# Patient Record
Sex: Female | Born: 1970 | Race: White | Hispanic: No | Smoking: Former smoker
Health system: Southern US, Community
[De-identification: ages and names within clinical notes are randomized; demographics above are authoritative.]

## PROBLEM LIST (undated history)

## (undated) DIAGNOSIS — M542 Cervicalgia: Secondary | ICD-10-CM

## (undated) DIAGNOSIS — F329 Major depressive disorder, single episode, unspecified: Secondary | ICD-10-CM

## (undated) DIAGNOSIS — R42 Dizziness and giddiness: Secondary | ICD-10-CM

## (undated) DIAGNOSIS — M5481 Occipital neuralgia: Secondary | ICD-10-CM

## (undated) DIAGNOSIS — R519 Headache, unspecified: Secondary | ICD-10-CM

## (undated) DIAGNOSIS — F419 Anxiety disorder, unspecified: Secondary | ICD-10-CM

## (undated) DIAGNOSIS — L0291 Cutaneous abscess, unspecified: Secondary | ICD-10-CM

## (undated) DIAGNOSIS — R5382 Chronic fatigue, unspecified: Secondary | ICD-10-CM

## (undated) DIAGNOSIS — M1990 Unspecified osteoarthritis, unspecified site: Secondary | ICD-10-CM

## (undated) DIAGNOSIS — M549 Dorsalgia, unspecified: Secondary | ICD-10-CM

## (undated) DIAGNOSIS — M797 Fibromyalgia: Secondary | ICD-10-CM

## (undated) DIAGNOSIS — F32A Depression, unspecified: Secondary | ICD-10-CM

## (undated) DIAGNOSIS — K219 Gastro-esophageal reflux disease without esophagitis: Secondary | ICD-10-CM

## (undated) DIAGNOSIS — E119 Type 2 diabetes mellitus without complications: Secondary | ICD-10-CM

## (undated) DIAGNOSIS — M5430 Sciatica, unspecified side: Secondary | ICD-10-CM

## (undated) DIAGNOSIS — I10 Essential (primary) hypertension: Secondary | ICD-10-CM

## (undated) DIAGNOSIS — G8929 Other chronic pain: Secondary | ICD-10-CM

## (undated) DIAGNOSIS — R51 Headache: Secondary | ICD-10-CM

## (undated) DIAGNOSIS — D649 Anemia, unspecified: Secondary | ICD-10-CM

## (undated) HISTORY — PX: DILATION AND CURETTAGE OF UTERUS: SHX78

## (undated) HISTORY — PX: TUBAL LIGATION: SHX77

---

## 2000-04-05 ENCOUNTER — Ambulatory Visit (HOSPITAL_COMMUNITY): Admission: RE | Admit: 2000-04-05 | Discharge: 2000-04-05 | Payer: Self-pay | Admitting: Family Medicine

## 2000-04-20 ENCOUNTER — Other Ambulatory Visit: Admission: RE | Admit: 2000-04-20 | Discharge: 2000-04-20 | Payer: Self-pay | Admitting: Family Medicine

## 2000-06-13 ENCOUNTER — Encounter: Payer: Self-pay | Admitting: Family Medicine

## 2000-06-13 ENCOUNTER — Ambulatory Visit (HOSPITAL_COMMUNITY): Admission: RE | Admit: 2000-06-13 | Discharge: 2000-06-13 | Payer: Self-pay | Admitting: Family Medicine

## 2000-11-03 ENCOUNTER — Inpatient Hospital Stay (HOSPITAL_COMMUNITY): Admission: AD | Admit: 2000-11-03 | Discharge: 2000-11-07 | Payer: Self-pay | Admitting: Family Medicine

## 2004-06-16 ENCOUNTER — Ambulatory Visit: Payer: Self-pay | Admitting: Family Medicine

## 2004-08-24 ENCOUNTER — Ambulatory Visit: Payer: Self-pay | Admitting: Family Medicine

## 2005-01-21 ENCOUNTER — Ambulatory Visit: Payer: Self-pay | Admitting: Family Medicine

## 2005-06-28 ENCOUNTER — Ambulatory Visit: Payer: Self-pay | Admitting: Family Medicine

## 2005-07-06 ENCOUNTER — Ambulatory Visit: Payer: Self-pay | Admitting: Family Medicine

## 2006-01-18 ENCOUNTER — Ambulatory Visit: Payer: Self-pay | Admitting: Family Medicine

## 2006-04-12 ENCOUNTER — Ambulatory Visit: Payer: Self-pay | Admitting: Family Medicine

## 2006-12-06 ENCOUNTER — Ambulatory Visit: Payer: Self-pay | Admitting: Family Medicine

## 2007-12-20 ENCOUNTER — Emergency Department (HOSPITAL_COMMUNITY): Admission: EM | Admit: 2007-12-20 | Discharge: 2007-12-20 | Payer: Self-pay | Admitting: Emergency Medicine

## 2009-07-25 ENCOUNTER — Emergency Department (HOSPITAL_COMMUNITY): Admission: EM | Admit: 2009-07-25 | Discharge: 2009-07-25 | Payer: Self-pay | Admitting: Emergency Medicine

## 2009-09-04 ENCOUNTER — Emergency Department (HOSPITAL_COMMUNITY): Admission: EM | Admit: 2009-09-04 | Discharge: 2009-09-04 | Payer: Self-pay | Admitting: Emergency Medicine

## 2009-11-12 ENCOUNTER — Emergency Department (HOSPITAL_COMMUNITY): Admission: EM | Admit: 2009-11-12 | Discharge: 2009-11-12 | Payer: Self-pay | Admitting: Emergency Medicine

## 2010-01-25 ENCOUNTER — Emergency Department (HOSPITAL_COMMUNITY): Admission: EM | Admit: 2010-01-25 | Discharge: 2010-01-25 | Payer: Self-pay | Admitting: Emergency Medicine

## 2010-09-26 LAB — URINALYSIS, ROUTINE W REFLEX MICROSCOPIC
Bilirubin Urine: NEGATIVE
Glucose, UA: NEGATIVE mg/dL
Hgb urine dipstick: NEGATIVE
Ketones, ur: NEGATIVE mg/dL
Nitrite: NEGATIVE
Protein, ur: NEGATIVE mg/dL
Specific Gravity, Urine: 1.015 (ref 1.005–1.030)
Urobilinogen, UA: 0.2 mg/dL (ref 0.0–1.0)
pH: 8 (ref 5.0–8.0)

## 2010-09-26 LAB — POCT PREGNANCY, URINE: Preg Test, Ur: NEGATIVE

## 2010-09-27 LAB — URINALYSIS, ROUTINE W REFLEX MICROSCOPIC
Ketones, ur: NEGATIVE mg/dL
Nitrite: NEGATIVE
Protein, ur: NEGATIVE mg/dL
Urobilinogen, UA: 0.2 mg/dL (ref 0.0–1.0)
pH: 6 (ref 5.0–8.0)

## 2010-09-27 LAB — URINE CULTURE: Colony Count: NO GROWTH

## 2010-11-27 NOTE — Discharge Summary (Signed)
[facility]  Patient:    Destiny Beck, Destiny Beck                      MRN: [id number] Adm. Date:  [id number] Disc. Date: 11/07/00 Attending:  Bosie Clos                           Discharge Summary  DATE OF BIRTH:                1970-09-26  ADMITTING DIAGNOSES:          A 40 year old G2, P1-0-0-1 at 30 5/7 weeks with spontaneous rupture of membranes, positive group B strep.  DISCHARGE DIAGNOSES:          1. A G2, P2 delivered at 39 5/7 weeks.                               2. Arrest of dilatation and persistent occiput                                  posterior presentation.                               3. Iron deficiency anemia.  PROCEDURE:                    Primary low transverse cesarean section for arrest of dilatation and descent and persistent right occiput posterior presentation.  SURGEON:                      Roseanna Rainbow, M.D.  FIRST ASSISTANT:              Lenda Kelp, M.D.  DISCHARGE MEDICATIONS:        1. Motrin 800 mg q.8h.                               2. Tylox one to two q.6h. p.r.n. pain.                               3. Ferrous sulfate b.i.d.                               4. Depo-Provera 150 mg IM given prior to                                  discharge.  DISCHARGE INSTRUCTIONS:       Routine postoperative discharge instructions were given.  Patient was to follow-up on Wednesday, Nov 09, 2000 at 2 p.m. for staple removal and again on Monday, Nov 14, 2000 for general followup.  DISCHARGE SUMMARY:            A 40 year old G2, P1 at 18 5/7 weeks presented after spontaneous rupture of membranes with clear fluid at 5-6 cm dilated. She progressed quickly to 7 cm, but then developed an arrest of dilatation at 8 cm with no further descent in station from -1/-2 station despite documented adequate  labor and augmentation with Pitocin.  She was taken to the OR for a primary low transverse cesarean section by Dr. Antionette Char with Dr. Gweneth Dimitri as first assist.  See operative note for further details. Delivered of a viable female infant persistent right occiput posterior presentation 7 pounds 3 ounces on November 03, 2000.  Postpartum course was routine, although the patient did have mildly elevated pain as compared to the normal course.  Very poor social situation with increased risk of postpartum depression led to extending her stay an additional 12 hours.  At the time of discharge the patient was ambulating well, stooling normally, voiding normally, eating and drinking well.  She was also caring for her infant son. She was bottle feeding.  Patient was to follow-up in 48 hours for staple removal in the office. DD:  11/07/00 TD:  11/07/00 Job: [id number]

## 2010-11-27 NOTE — Op Note (Signed)
[facility]  Patient:    Destiny Beck, Destiny Beck                      MRN: [id number] Proc. Date: 11/03/00 Adm. Date:  [id number] Attending:  Bosie Clos                           Operative Report  PREOPERATIVE DIAGNOSES:       1. Intrauterine pregnancy at term.                               2. Persistent occiput posterior.                               3. Arrest of dilatation.                               4. Active phase.  POSTOPERATIVE DIAGNOSES:      1. Intrauterine pregnancy at term.                               2. Persistent occiput posterior.                               3. Arrest of dilatation.                               4. Active phase.  PROCEDURE:                    Primary low uterine flap elliptical cesarean delivery via Pfannenstiel.  SURGEON:                      Roseanna Rainbow, M.D.  Threasa HeadsUvaldo Rising, M.D.  ANESTHESIA:                   Epidural.  COMPLICATIONS:                None.  ESTIMATED BLOOD LOSS:         800 cc.  FLUIDS:                       1500 cc lactated Ringers.  URINE OUTPUT:                 300 cc clear urine.  INDICATIONS:                  The patient presented in early active labor at term.  She was then augmented with Pitocin.  Maximum dilatation 8 cm.  FINDINGS:                     The infant was in the cephalic presentation. Neonatology was present at delivery.  Normal uterus, tubes and ovaries.  DESCRIPTION OF PROCEDURE:     The patient was taken to the operating room, where epidural anesthetic was found to be adequate.  She was then prepped and draped in the  usual sterile fashion in the dorsal supine position with a leftward tilt.  A Pfannenstiel skin incision was then made with a scalpel an carried through to the underlying layer of fascia.  The fascia was nicked in a the midline and the incision extended laterally with Mayo scissors.  The superior aspect of the  fascial incision was then grasped with Kocher clams, elevated, and the underlying rectus muscles dissected off.  Attention was turned to the inferior aspect of this incision, which was manipulated in a similar fashion.  The rectus muscles were separated in the midline and the peritoneum identified, tented up and entered sharply.  The peritoneal incision was then extended superiorly and inferiorly with good visualization of the bladder.  A bladder blade was then inserted and the vesicouterine peritoneum identified, grasped with  pickups and entered sharply.  This incision was then extended laterally and a bladder flap created digitally.  The bladder blade was then reinserted and the lower uterine segment incised in a transverse fashion with a scalpel.  The uterine incision was then extended laterally with bandage scissors.  The bladder blade was removed and the infants head delivered atraumatically.  The nose and mouth were suctioned with bulb suction.  The cord was clamped and cut.  The infant was handed off to the waiting neonatologists.  The placenta was then removed.  The uterus was exteriorized and cleared of all clots and debris.  The uterine incision was repaired with O Monocryl in running lock fashion.  Excellent hemostasis was noted.  The uterus was returned to the abdomen.  The gutters were cleared of all clots.  The fascia was reapproximated with 0 Vicryl in running fashion. The skin was closed with staples.  The patient tolerated this procedure well. Sponge, lap, and needle counts were correct x 2.  The patient was taken to the PACU in stable condition. DD:  11/07/00 TD:  11/07/00 Job: [id number]

## 2012-12-25 ENCOUNTER — Emergency Department (HOSPITAL_COMMUNITY): Payer: Managed Care, Other (non HMO)

## 2012-12-25 ENCOUNTER — Emergency Department (HOSPITAL_COMMUNITY)
Admission: EM | Admit: 2012-12-25 | Discharge: 2012-12-25 | Disposition: A | Discharge status: Home / Self Care | Payer: Managed Care, Other (non HMO) | Attending: Emergency Medicine | Admitting: Emergency Medicine

## 2012-12-25 ENCOUNTER — Encounter (HOSPITAL_COMMUNITY): Payer: Self-pay | Admitting: *Deleted

## 2012-12-25 DIAGNOSIS — R6889 Other general symptoms and signs: Secondary | ICD-10-CM | POA: Insufficient documentation

## 2012-12-25 DIAGNOSIS — R059 Cough, unspecified: Secondary | ICD-10-CM | POA: Insufficient documentation

## 2012-12-25 DIAGNOSIS — Z79899 Other long term (current) drug therapy: Secondary | ICD-10-CM | POA: Insufficient documentation

## 2012-12-25 DIAGNOSIS — R109 Unspecified abdominal pain: Secondary | ICD-10-CM | POA: Insufficient documentation

## 2012-12-25 DIAGNOSIS — J3489 Other specified disorders of nose and nasal sinuses: Secondary | ICD-10-CM | POA: Insufficient documentation

## 2012-12-25 DIAGNOSIS — F17200 Nicotine dependence, unspecified, uncomplicated: Secondary | ICD-10-CM | POA: Insufficient documentation

## 2012-12-25 DIAGNOSIS — R05 Cough: Secondary | ICD-10-CM | POA: Insufficient documentation

## 2012-12-25 LAB — CBC WITH DIFFERENTIAL/PLATELET
Basophils Absolute: 0 10*3/uL (ref 0.0–0.1)
Eosinophils Relative: 3 % (ref 0–5)
HCT: 37.4 % (ref 36.0–46.0)
Lymphocytes Relative: 20 % (ref 12–46)
Lymphs Abs: 1 10*3/uL (ref 0.7–4.0)
MCV: 88 fL (ref 78.0–100.0)
Monocytes Absolute: 0.5 10*3/uL (ref 0.1–1.0)
Neutro Abs: 3.2 10*3/uL (ref 1.7–7.7)
Platelets: 208 10*3/uL (ref 150–400)
RBC: 4.25 MIL/uL (ref 3.87–5.11)
WBC: 4.8 10*3/uL (ref 4.0–10.5)

## 2012-12-25 LAB — BASIC METABOLIC PANEL
CO2: 28 mEq/L (ref 19–32)
Chloride: 102 mEq/L (ref 96–112)
GFR calc Af Amer: >90 mL/min (ref >90)
Potassium: 4.5 mEq/L (ref 3.5–5.1)
Sodium: 138 mEq/L (ref 135–145)

## 2012-12-25 LAB — URINALYSIS, ROUTINE W REFLEX MICROSCOPIC
Glucose, UA: NEGATIVE mg/dL
Leukocytes, UA: NEGATIVE
Nitrite: NEGATIVE
Specific Gravity, Urine: 1.02 (ref 1.005–1.030)
pH: 7.5 (ref 5.0–8.0)

## 2012-12-25 MED ORDER — SODIUM CHLORIDE 0.9 % IV SOLN
INTRAVENOUS | Status: DC
  Administered 2012-12-25: 16:00:00 via INTRAVENOUS

## 2012-12-25 MED ORDER — SODIUM CHLORIDE 0.9 % IV BOLUS (SEPSIS)
250.0000 mL | Freq: Once | INTRAVENOUS | Status: AC
  Administered 2012-12-25: 250 mL via INTRAVENOUS

## 2012-12-25 MED ORDER — NAPROXEN 500 MG PO TABS: 500.0000 mg | ORAL_TABLET | Freq: Two times a day (BID) | ORAL | Status: DC

## 2012-12-25 MED ORDER — IOHEXOL 300 MG/ML  SOLN
50.0000 mL | Freq: Once | INTRAMUSCULAR | Status: AC | PRN
  Administered 2012-12-25: 50 mL via ORAL

## 2012-12-25 MED ORDER — ONDANSETRON HCL 4 MG/2ML IJ SOLN
4.0000 mg | Freq: Once | INTRAMUSCULAR | Status: AC
  Administered 2012-12-25: 4 mg via INTRAVENOUS
  Filled 2012-12-25: qty 2

## 2012-12-25 MED ORDER — HYDROCODONE-ACETAMINOPHEN 5-325 MG PO TABS: 1.0000 | ORAL_TABLET | Freq: Four times a day (QID) | ORAL | Status: DC | PRN

## 2012-12-25 MED ORDER — HYDROMORPHONE HCL PF 1 MG/ML IJ SOLN
0.5000 mg | Freq: Once | INTRAMUSCULAR | Status: AC
  Administered 2012-12-25: 0.5 mg via INTRAVENOUS
  Filled 2012-12-25: qty 1

## 2012-12-25 MED ORDER — IOHEXOL 300 MG/ML  SOLN
100.0000 mL | Freq: Once | INTRAMUSCULAR | Status: AC | PRN
  Administered 2012-12-25: 100 mL via INTRAVENOUS

## 2012-12-25 NOTE — ED Provider Notes (Addendum)
History     This chart was scribed for Destiny Jakes, MD, MD by Smitty Pluck, ED Scribe. The patient was seen in room APA05/APA05 and the patient's care was started at 3:00 PM.   CSN: [id number]  Arrival date & time 12/25/12  1127   Chief Complaint  Patient presents with  . Abdominal Pain  . Cough     Patient is a 42 y.o. female presenting with abdominal pain. The history is provided by the patient and medical records. No language interpreter was used.  Abdominal Pain This is a new problem. The current episode started more than 2 days ago. The problem occurs daily. The problem has not changed since onset.Associated symptoms include abdominal pain. Pertinent negatives include no headaches. The symptoms are aggravated by bending and coughing. Nothing relieves the symptoms. She has tried nothing for the symptoms.   HPI Comments: Destiny Beck is a 42 y.o. female who presents to the Emergency Department complaining of intermittent, moderate, lower abdominal pain that is greater in LLQ onset 1 week. Pain is rated at 8/10. She reports that pain started 1 day after using slip-n-slide with son. She states that the pain feels similar to when she had c-section. Pain is aggravated by movement. She reports that she has taken 800 mg ibuprofen without relief. Pt reports that she has productive cough with yellow sputum and congestion. Pt denies blood in urine, dysuria, fever, chills, nausea, vomiting, diarrhea, weakness, SOB and any other pain.   PCP is Dr. Lysbeth Galas in [place]  History reviewed. No pertinent past medical history.  Past Surgical History  Procedure Laterality Date  . Cesarean section      No family history on file.  History  Substance Use Topics  . Smoking status: Current Some Day Smoker  . Smokeless tobacco: Not on file  . Alcohol Use: Yes     Comment: Occ    OB History   Grav Para Term Preterm Abortions TAB SAB Ect Mult Living                  Review of Systems   Constitutional: Negative for fever and chills.  HENT: Positive for congestion and sneezing.   Respiratory: Positive for cough.   Cardiovascular: Negative for leg swelling.  Gastrointestinal: Positive for abdominal pain. Negative for nausea, vomiting and diarrhea.  Genitourinary: Negative for dysuria.  Musculoskeletal: Negative for back pain.  Skin: Negative for rash.  Neurological: Negative for light-headedness and headaches.  Hematological: Does not bruise/bleed easily.  Psychiatric/Behavioral: Negative for confusion.  All other systems reviewed and are negative.    Allergies  Review of patient's allergies indicates no known allergies.  Home Medications   Current Outpatient Rx  Name  Route  Sig  Dispense  Refill  . ALPRAZolam (XANAX) 0.5 MG tablet   Oral   Take 0.5 mg by mouth 2 (two) times daily as needed for sleep.         . Biotin 5000 MCG TABS   Oral   Take 1 tablet by mouth daily.         Marland Kitchen escitalopram (LEXAPRO) 10 MG tablet   Oral   Take 10 mg by mouth daily.         Marland Kitchen ibuprofen (ADVIL,MOTRIN) 800 MG tablet   Oral   Take 800 mg by mouth every 8 (eight) hours as needed for pain.         Marland Kitchen HYDROcodone-acetaminophen (NORCO/VICODIN) 5-325 MG per tablet   Oral  Take 1-2 tablets by mouth every 6 (six) hours as needed for pain.   15 tablet   0   . naproxen (NAPROSYN) 500 MG tablet   Oral   Take 1 tablet (500 mg total) by mouth 2 (two) times daily.   14 tablet   0     BP 107/71  Pulse 60  Temp(Src) 98.6 F (37 C) (Oral)  Resp 20  Ht 5' (1.524 m)  Wt 140 lb (63.504 kg)  BMI 27.34 kg/m2  SpO2 100%  LMP 12/03/2012  Physical Exam  Nursing note and vitals reviewed. Constitutional: She is oriented to person, place, and time. She appears well-developed and well-nourished. No distress.  HENT:  Head: Normocephalic and atraumatic.  Eyes: Conjunctivae and EOM are normal. Pupils are equal, round, and reactive to light.  Neck: Normal range of motion.  Neck supple. No tracheal deviation present.  Cardiovascular: Normal rate, regular rhythm and normal heart sounds.   No murmur heard. Pulmonary/Chest: Effort normal and breath sounds normal. No respiratory distress. She has no wheezes. She has no rales.  Abdominal: Soft. Bowel sounds are normal. She exhibits no distension. There is tenderness (bilateral lower quadrants). There is no rebound and no guarding.  Musculoskeletal: Normal range of motion. She exhibits no edema.  Neurological: She is alert and oriented to person, place, and time. No cranial nerve deficit. Coordination normal.  Skin: Skin is warm and dry.  Psychiatric: She has a normal mood and affect. Her behavior is normal.    ED Course  Procedures (including critical care time) DIAGNOSTIC STUDIES: Oxygen Saturation is 100% on room air, normal by my interpretation.    COORDINATION OF CARE: 3:05 PM Discussed ED treatment with pt and pt agrees.     Labs Reviewed  BASIC METABOLIC PANEL - Abnormal; Notable for the following:    GFR calc non Af Amer 83 (*)    All other components within normal limits  URINALYSIS, ROUTINE W REFLEX MICROSCOPIC  CBC WITH DIFFERENTIAL   Dg Chest 2 View  12/25/2012   *RADIOLOGY REPORT*  Clinical Data: Abdominal pain, cough  CHEST - 2 VIEW  Comparison: Prior chest x-ray 04/10/2012  Findings: The lungs are well-aerated and free from pulmonary edema, focal airspace consolidation or pulmonary nodule.  Cardiac and mediastinal contours are within normal limits.  No pneumothorax, or pleural effusion. No acute osseous findings.  IMPRESSION:  No acute cardiopulmonary disease.   Original Report Authenticated By: Malachy Moan, M.D.   Ct Abdomen Pelvis W Contrast  12/25/2012   *RADIOLOGY REPORT*  Clinical Data: Abdominal pain and cough  CT ABDOMEN AND PELVIS WITH CONTRAST  Technique:  Multidetector CT imaging of the abdomen and pelvis was performed following the standard protocol during bolus administration of  intravenous contrast.  Contrast: 50mL OMNIPAQUE IOHEXOL 300 MG/ML  SOLN, OMNIPAQUE IOHEXOL 300 MG/ML  SOLN  Comparison: CT abdomen pelvis without contrast 07/30/2009  Findings: There is dependent atelectasis in both lower lobes imaged portion the heart is unremarkable.  Negative for pleural or pericardial effusion.  3 mm low density lesion in the left lobe of the liver is too small to characterize but statistically likely benign.  A similar appearing 3 mm low density lesion in the right lobe of the lobe posteriorly.  The liver is normal in size.  Negative for biliary ductal dilatation.  The there are a few sub-centimeter calcified gallstones layering dependently within the gallbladder.  This appearance is unchanged compared to 1011.  The gallbladder wall thickness  appears normal and there is no pericholecystic inflammatory change.  The kidneys are normal in size and contour bilaterally.  Bilateral nephrolithiasis is present.  There are two stones in the lower pole right kidney, largest measuring 3 mm.  One stone is seen in the lower pole left kidney, measuring 3 mm.  Negative for hydronephrosis or perinephric stranding.  Both ureters are normal in caliber.  No ureteral stone or periureteric inflammatory change.  Urinary bladder appears normal.  The spleen, adrenal glands, and pancreas are within normal limits.  The stomach, small bowel loops and terminal ileum are normal.  The appendix is visualized on images 61-63 and is normal.  Colon is normal in caliber.  There are a few scattered colonic diverticula but appear inflamed.  Abdominal aorta is normal in caliber.  Negative for lymphadenopathy, ascites, or free air.  The uterus appears stable in size and contour.  The adnexa are within normal limits.  Vertebral bodies are normal in height and alignment.  There is a small posterior disc bulge at L5-S1.  No significant disc height narrowing.  IMPRESSION:  1.  Bilateral nonobstructing renal calculi. 2.   Cholelithiasis. 3.  No definite acute findings in the abdomen or pelvis. 4.  Two tiny low density lesions in the liver are too small to characterize and statistically most likely benign.   Original Report Authenticated By: Britta Mccreedy, M.D.   Results for orders placed during the hospital encounter of 12/25/12  URINALYSIS, ROUTINE W REFLEX MICROSCOPIC      Result Value Range   Color, Urine YELLOW  YELLOW   APPearance CLEAR  CLEAR   Specific Gravity, Urine 1.020  1.005 - 1.030   pH 7.5  5.0 - 8.0   Glucose, UA NEGATIVE  NEGATIVE mg/dL   Hgb urine dipstick NEGATIVE  NEGATIVE   Bilirubin Urine NEGATIVE  NEGATIVE   Ketones, ur NEGATIVE  NEGATIVE mg/dL   Protein, ur NEGATIVE  NEGATIVE mg/dL   Urobilinogen, UA 0.2  0.0 - 1.0 mg/dL   Nitrite NEGATIVE  NEGATIVE   Leukocytes, UA NEGATIVE  NEGATIVE  BASIC METABOLIC PANEL      Result Value Range   Sodium 138  135 - 145 mEq/L   Potassium 4.5  3.5 - 5.1 mEq/L   Chloride 102  96 - 112 mEq/L   CO2 28  19 - 32 mEq/L   Glucose, Bld 85  70 - 99 mg/dL   BUN 12  6 - 23 mg/dL   Creatinine, Ser 1.61  0.50 - 1.10 mg/dL   Calcium 9.3  8.4 - 09.6 mg/dL   GFR calc non Af Amer 83 (*) >90 mL/min   GFR calc Af Amer >90  >90 mL/min  CBC WITH DIFFERENTIAL      Result Value Range   WBC 4.8  4.0 - 10.5 K/uL   RBC 4.25  3.87 - 5.11 MIL/uL   Hemoglobin 12.0  12.0 - 15.0 g/dL   HCT 04.5  40.9 - 81.1 %   MCV 88.0  78.0 - 100.0 fL   MCH 28.2  26.0 - 34.0 pg   MCHC 32.1  30.0 - 36.0 g/dL   RDW 91.4  78.2 - 95.6 %   Platelets 208  150 - 400 K/uL   Neutrophils Relative % 67  43 - 77 %   Neutro Abs 3.2  1.7 - 7.7 K/uL   Lymphocytes Relative 20  12 - 46 %   Lymphs Abs 1.0  0.7 - 4.0 K/uL  Monocytes Relative 10  3 - 12 %   Monocytes Absolute 0.5  0.1 - 1.0 K/uL   Eosinophils Relative 3  0 - 5 %   Eosinophils Absolute 0.1  0.0 - 0.7 K/uL   Basophils Relative 0  0 - 1 %   Basophils Absolute 0.0  0.0 - 0.1 K/uL     1. Lower abdominal pain, unspecified  laterality       MDM  Incidental finding of gallstones do not believe that is related to her pain. Suspect pain may be muscle skeletal particularly with the recent use of the slip and slide. Rest the CT was negative no significant leukocytosis no evidence urinary tract infection no evidence of renal insufficiency. We'll treat with anti-inflammatories and pain medicine patient will be told about the gallstone finding in case develops symptoms in the future. She has a primary care Dr. followup with.  Patient also with a cough chest x-rays negative for pneumonia. It may just be a bronchitis. Will recommend Mucinex DM over-the-counter.   I personally performed the services described in this documentation, which was scribed in my presence. The recorded information has been reviewed and is accurate.     Destiny Jakes, MD 12/25/12 4540  Destiny Jakes, MD 12/25/12 [phone]

## 2012-12-25 NOTE — ED Notes (Signed)
Lower abdominal pain x 1 week, intermittent, states pain is worse in certain positions. Worse with coughing. Pt states it feels like a pulled muscle. Productive cough, dark yellow in color x 1 1/2 weeks.

## 2013-05-10 ENCOUNTER — Emergency Department (HOSPITAL_COMMUNITY)
Admission: EM | Admit: 2013-05-10 | Discharge: 2013-05-10 | Disposition: A | Discharge status: Home / Self Care | Payer: Managed Care, Other (non HMO) | Attending: Emergency Medicine | Admitting: Emergency Medicine

## 2013-05-10 ENCOUNTER — Encounter (HOSPITAL_COMMUNITY): Payer: Self-pay | Admitting: Emergency Medicine

## 2013-05-10 DIAGNOSIS — F411 Generalized anxiety disorder: Secondary | ICD-10-CM | POA: Insufficient documentation

## 2013-05-10 DIAGNOSIS — M62830 Muscle spasm of back: Secondary | ICD-10-CM

## 2013-05-10 DIAGNOSIS — M62838 Other muscle spasm: Secondary | ICD-10-CM | POA: Insufficient documentation

## 2013-05-10 DIAGNOSIS — Z791 Long term (current) use of non-steroidal anti-inflammatories (NSAID): Secondary | ICD-10-CM | POA: Insufficient documentation

## 2013-05-10 DIAGNOSIS — Z87891 Personal history of nicotine dependence: Secondary | ICD-10-CM | POA: Insufficient documentation

## 2013-05-10 DIAGNOSIS — Z79899 Other long term (current) drug therapy: Secondary | ICD-10-CM | POA: Insufficient documentation

## 2013-05-10 HISTORY — DX: Anxiety disorder, unspecified: F41.9

## 2013-05-10 MED ORDER — NAPROXEN 500 MG PO TABS: 500.0000 mg | ORAL_TABLET | Freq: Two times a day (BID) | ORAL | Status: DC

## 2013-05-10 MED ORDER — KETOROLAC TROMETHAMINE 60 MG/2ML IM SOLN
60.0000 mg | Freq: Once | INTRAMUSCULAR | Status: AC
  Administered 2013-05-10: 60 mg via INTRAMUSCULAR
  Filled 2013-05-10: qty 2

## 2013-05-10 MED ORDER — METHOCARBAMOL 500 MG PO TABS: 500.0000 mg | ORAL_TABLET | Freq: Two times a day (BID) | ORAL | Status: DC | PRN

## 2013-05-10 NOTE — ED Notes (Signed)
Patient with no complaints at this time. Respirations even and unlabored. Skin warm/dry. Discharge instructions reviewed with patient at this time. Patient given opportunity to voice concerns/ask questions. Patient discharged at this time and left Emergency Department with steady gait.   

## 2013-05-10 NOTE — ED Provider Notes (Addendum)
CSN: [id number]     Arrival date & time 05/10/13  4098 History  This chart was scribed for Vida Roller, MD by Blanchard Kelch, ED Scribe. The patient was seen in room APA04/APA04. Patient's care was started at 8:50 AM.    Chief Complaint  Patient presents with  . Back Pain    Patient is a 42 y.o. female presenting with back pain. The history is provided by the patient. No language interpreter was used.  Back Pain Associated symptoms: no fever     HPI Comments: Destiny Beck is a 42 y.o. female who presents to the Emergency Department complaining of upper left back pain that began last night while she was in the shower. She felt her muscle catch and cannot move her left arm above her shoulder without pain. She has taken hydrocodone and ibuprofen for the pain as well as ice and heat without relief. She denies numbness or tingling in the left hand, fever, or chills.   Past Medical History  Diagnosis Date  . Anxiety    Past Surgical History  Procedure Laterality Date  . Cesarean section     No family history on file. History  Substance Use Topics  . Smoking status: Former Games developer  . Smokeless tobacco: Not on file  . Alcohol Use: Yes     Comment: Occ   OB History   Grav Para Term Preterm Abortions TAB SAB Ect Mult Living                 Review of Systems  Constitutional: Negative for fever and chills.  Musculoskeletal: Positive for back pain.  All other systems reviewed and are negative.    Allergies  Review of patient's allergies indicates no known allergies.  Home Medications   Current Outpatient Rx  Name  Route  Sig  Dispense  Refill  . ALPRAZolam (XANAX) 0.5 MG tablet   Oral   Take 0.5 mg by mouth 2 (two) times daily as needed for sleep.         . Biotin 5000 MCG TABS   Oral   Take 1 tablet by mouth daily.         Marland Kitchen escitalopram (LEXAPRO) 10 MG tablet   Oral   Take 10 mg by mouth daily.         Marland Kitchen HYDROcodone-acetaminophen (NORCO/VICODIN) 5-325 MG  per tablet   Oral   Take 1-2 tablets by mouth every 6 (six) hours as needed for pain.   15 tablet   0   . ibuprofen (ADVIL,MOTRIN) 800 MG tablet   Oral   Take 800 mg by mouth every 8 (eight) hours as needed for pain.         . methocarbamol (ROBAXIN) 500 MG tablet   Oral   Take 1 tablet (500 mg total) by mouth 2 (two) times daily as needed.   20 tablet   0   . naproxen (NAPROSYN) 500 MG tablet   Oral   Take 1 tablet (500 mg total) by mouth 2 (two) times daily.   14 tablet   0   . naproxen (NAPROSYN) 500 MG tablet   Oral   Take 1 tablet (500 mg total) by mouth 2 (two) times daily with a meal.   30 tablet   0    Triage Vitals: BP 130/81  Pulse 60  Temp(Src) 98.4 F (36.9 C) (Oral)  Resp 20  Ht 5' (1.524 m)  Wt 150 lb (68.04 kg)  BMI  29.3 kg/m2  SpO2 100%  LMP 05/04/2013  Physical Exam  Nursing note and vitals reviewed. Constitutional: She is oriented to person, place, and time. She appears well-developed and well-nourished. No distress.  HENT:  Head: Normocephalic and atraumatic.  Eyes: EOM are normal.  Neck: Neck supple. No tracheal deviation present.  Cardiovascular: Normal rate and regular rhythm.   No murmur heard. Pulmonary/Chest: Effort normal and breath sounds normal. No respiratory distress.  Musculoskeletal: Normal range of motion.  Focal tenderness to palpation in left rhomboid.  Neurological: She is alert and oriented to person, place, and time. She has normal strength. No sensory deficit.  Normal strength and sensation of left upper extremity.  Skin: Skin is warm and dry.  Psychiatric: She has a normal mood and affect. Her behavior is normal.    ED Course  Procedures (including critical care time)  DIAGNOSTIC STUDIES: Oxygen Saturation is 100% on room air, normal by my interpretation.    COORDINATION OF CARE: 8:53 AM -Recommend stretching area and rest from work. Patient verbalizes understanding and agrees with treatment plan.    Labs  Review Labs Reviewed - No data to display Imaging Review No results found.  EKG Interpretation   None       MDM   1. Muscle spasm of back    Pt is well appaering, she has no signs of pathological findings.  She has no neurological defecity and has reproducible ttp in the rhomboid on the L.  Stable for d/c.  Meds given in ED:  Medications  ketorolac (TORADOL) injection 60 mg (60 mg Intramuscular Given 05/10/13 0906)    Discharge Medication List as of 05/10/2013  8:53 AM    START taking these medications   Details  methocarbamol (ROBAXIN) 500 MG tablet Take 1 tablet (500 mg total) by mouth 2 (two) times daily as needed., Starting 05/10/2013, Until Discontinued, Print    !! naproxen (NAPROSYN) 500 MG tablet Take 1 tablet (500 mg total) by mouth 2 (two) times daily with a meal., Starting 05/10/2013, Until Discontinued, Print     !! - Potential duplicate medications found. Please discuss with provider.        I personally performed the services described in this documentation, which was scribed in my presence. The recorded information has been reviewed and is accurate.      Vida Roller, MD 05/10/13 1515  Vida Roller, MD 05/10/13 [phone]

## 2013-05-10 NOTE — ED Notes (Signed)
Reports pulled muscle in upper back last night after getting up from couch.

## 2013-05-28 ENCOUNTER — Encounter (HOSPITAL_COMMUNITY): Payer: Self-pay | Admitting: Emergency Medicine

## 2013-05-28 ENCOUNTER — Emergency Department (HOSPITAL_COMMUNITY)
Admission: EM | Admit: 2013-05-28 | Discharge: 2013-05-29 | Disposition: A | Discharge status: Home / Self Care | Payer: Managed Care, Other (non HMO) | Attending: Emergency Medicine | Admitting: Emergency Medicine

## 2013-05-28 DIAGNOSIS — H81399 Other peripheral vertigo, unspecified ear: Secondary | ICD-10-CM | POA: Insufficient documentation

## 2013-05-28 DIAGNOSIS — H9319 Tinnitus, unspecified ear: Secondary | ICD-10-CM | POA: Insufficient documentation

## 2013-05-28 DIAGNOSIS — Z79899 Other long term (current) drug therapy: Secondary | ICD-10-CM | POA: Insufficient documentation

## 2013-05-28 DIAGNOSIS — F411 Generalized anxiety disorder: Secondary | ICD-10-CM | POA: Insufficient documentation

## 2013-05-28 DIAGNOSIS — R110 Nausea: Secondary | ICD-10-CM | POA: Insufficient documentation

## 2013-05-28 DIAGNOSIS — Z87891 Personal history of nicotine dependence: Secondary | ICD-10-CM | POA: Insufficient documentation

## 2013-05-28 MED ORDER — ONDANSETRON HCL 4 MG/2ML IJ SOLN
4.0000 mg | Freq: Once | INTRAMUSCULAR | Status: AC
  Administered 2013-05-29: 4 mg via INTRAVENOUS
  Filled 2013-05-28: qty 2

## 2013-05-28 MED ORDER — SODIUM CHLORIDE 0.9 % IV SOLN
1000.0000 mL | Freq: Once | INTRAVENOUS | Status: AC
  Administered 2013-05-29: 1000 mL via INTRAVENOUS

## 2013-05-28 MED ORDER — SODIUM CHLORIDE 0.9 % IV SOLN: 1000.0000 mL | INTRAVENOUS | Status: DC

## 2013-05-28 MED ORDER — MECLIZINE HCL 12.5 MG PO TABS
25.0000 mg | ORAL_TABLET | Freq: Once | ORAL | Status: AC
  Administered 2013-05-29: 25 mg via ORAL
  Filled 2013-05-28: qty 2

## 2013-05-28 NOTE — ED Notes (Signed)
MD at bedside. 

## 2013-05-28 NOTE — ED Notes (Addendum)
Pt reports she began having some dizziness about 2 days ago.  Reporting some nausea as well.  Reporting full feeling in both ears, left more so than right.

## 2013-05-28 NOTE — ED Provider Notes (Signed)
CSN: [id number]     Arrival date & time 05/28/13  2114 History   First MD Initiated Contact with Patient 05/28/13 2348      This chart was scribed for Dione Booze, MD by Arlan Organ, ED Scribe. This patient was seen in room APA18/APA18 and the patient's care was started 11:54 PM.   Chief Complaint  Patient presents with  . Dizziness    The history is provided by the patient and a relative. No language interpreter was used.   HPI Comments: Destiny Beck is a 42 y.o. Female with a hx of vertigo and anxiety who presents to the Emergency Department complaining of sudden onset, intermittent, unchanged dizziness that started 2 days ago. She states she has been on medication for a back injury, and felt her symptoms may be related to her prior injury. She says movement worsens the pain, and nothing improves the pain. She states when the dizziness comes, she reports tinnitus and nausea. She has tried ear drops and consuming more fluids with no relief for her symptoms. She states her symptoms slightly improved earlier today, but returned this evening. Pt states she is still having difficulty ambulating, but denies any current nausea. She denies any auditory changes.  PCP- Dr. Marca Ancona Past Medical History  Diagnosis Date  . Anxiety    Past Surgical History  Procedure Laterality Date  . Cesarean section     History reviewed. No pertinent family history. History  Substance Use Topics  . Smoking status: Former Games developer  . Smokeless tobacco: Not on file  . Alcohol Use: Yes     Comment: Occ   OB History   Grav Para Term Preterm Abortions TAB SAB Ect Mult Living                 Review of Systems  Neurological: Positive for dizziness.  All other systems reviewed and are negative.    Allergies  Review of patient's allergies indicates no known allergies.  Home Medications   Current Outpatient Rx  Name  Route  Sig  Dispense  Refill  . ALPRAZolam (XANAX) 0.5 MG tablet   Oral   Take 0.5  mg by mouth 2 (two) times daily as needed for sleep.         . Biotin 5000 MCG TABS   Oral   Take 1 tablet by mouth daily.         Marland Kitchen escitalopram (LEXAPRO) 10 MG tablet   Oral   Take 10 mg by mouth daily.         Marland Kitchen HYDROcodone-acetaminophen (NORCO/VICODIN) 5-325 MG per tablet   Oral   Take 1-2 tablets by mouth every 6 (six) hours as needed for pain.   15 tablet   0   . ibuprofen (ADVIL,MOTRIN) 800 MG tablet   Oral   Take 800 mg by mouth every 8 (eight) hours as needed for pain.         . methocarbamol (ROBAXIN) 500 MG tablet   Oral   Take 1 tablet (500 mg total) by mouth 2 (two) times daily as needed.   20 tablet   0   . naproxen (NAPROSYN) 500 MG tablet   Oral   Take 1 tablet (500 mg total) by mouth 2 (two) times daily.   14 tablet   0   . naproxen (NAPROSYN) 500 MG tablet   Oral   Take 1 tablet (500 mg total) by mouth 2 (two) times daily with a meal.  30 tablet   0    Triage Vitals: BP 121/82  Pulse 71  Temp(Src) 99.1 F (37.3 C) (Oral)  Resp 16  Ht 5' (1.524 m)  Wt 150 lb (68.04 kg)  BMI 29.30 kg/m2  SpO2 100%  LMP 05/04/2013  Physical Exam  Nursing note and vitals reviewed. Constitutional: She is oriented to person, place, and time. She appears well-developed and well-nourished.  HENT:  Head: Normocephalic.  Right Ear: Tympanic membrane normal.  Left Ear: Tympanic membrane normal.  Eyes: EOM are normal.  Neck: Normal range of motion.  Pulmonary/Chest: Effort normal.  Abdominal: She exhibits no distension.  Musculoskeletal: Normal range of motion.  Neurological: She is alert and oriented to person, place, and time.  No nystagmus Dizziness is produced by head movement Normal finger to nose test  Psychiatric: She has a normal mood and affect.    ED Course  Procedures (including critical care time)  DIAGNOSTIC STUDIES: Oxygen Saturation is 100% on RA, Normal by my interpretation.    COORDINATION OF CARE: 11:55 PM- Will give Zofran  and Antivert. Will order blood work. Discussed treatment plan with pt at bedside and pt agreed to plan.     Labs Review Results for orders placed during the hospital encounter of 05/28/13  CBC WITH DIFFERENTIAL      Result Value Range   WBC 6.3  4.0 - 10.5 K/uL   RBC 4.34  3.87 - 5.11 MIL/uL   Hemoglobin 12.3  12.0 - 15.0 g/dL   HCT 16.1  09.6 - 04.5 %   MCV 87.1  78.0 - 100.0 fL   MCH 28.3  26.0 - 34.0 pg   MCHC 32.5  30.0 - 36.0 g/dL   RDW 40.9  81.1 - 91.4 %   Platelets 211  150 - 400 K/uL   Neutrophils Relative % 66  43 - 77 %   Neutro Abs 4.3  1.7 - 7.7 K/uL   Lymphocytes Relative 22  12 - 46 %   Lymphs Abs 1.4  0.7 - 4.0 K/uL   Monocytes Relative 9  3 - 12 %   Monocytes Absolute 0.6  0.1 - 1.0 K/uL   Eosinophils Relative 3  0 - 5 %   Eosinophils Absolute 0.2  0.0 - 0.7 K/uL   Basophils Relative 0  0 - 1 %   Basophils Absolute 0.0  0.0 - 0.1 K/uL  BASIC METABOLIC PANEL      Result Value Range   Sodium 134 (*) 135 - 145 mEq/L   Potassium 3.6  3.5 - 5.1 mEq/L   Chloride 99  96 - 112 mEq/L   CO2 25  19 - 32 mEq/L   Glucose, Bld 90  70 - 99 mg/dL   BUN 14  6 - 23 mg/dL   Creatinine, Ser 7.82  0.50 - 1.10 mg/dL   Calcium 9.7  8.4 - 95.6 mg/dL   GFR calc non Af Amer 77 (*) >90 mL/min   GFR calc Af Amer 89 (*) >90 mL/min    1. Peripheral vertigo, unspecified laterality    Vertigo which seems most consistent with peripheral vertigo. She'll be given IV fluids, IV ondansetron, and a therapeutic trial of meclizine. Screening labs are obtained.  She feels considerably better after meclizine. Laboratory workup is significant for minimal hyponatremia which is not clinically significant. She is discharged with prescription for meclizine.  I personally performed the services described in this documentation, which was scribed in my presence. The recorded information  has been reviewed and is accurate.     Dione Booze, MD 05/29/13 [phone]

## 2013-05-28 NOTE — ED Notes (Signed)
Pt reports hx of vertigo, stated the episodes stopped and she stopped taking her medication. Began experiencing dizziness two days ago, states she would sit still but the room would "keep spinning. If I get up too quickly or move too quickly I'm hoping there is something to hold on to. The room just spins and sometimes I see stars." Reports one episode of nausea which has subsided. Nad noted at this time. Pt ambulated to room from waiting room with no difficulties.

## 2013-05-29 LAB — CBC WITH DIFFERENTIAL/PLATELET
Basophils Absolute: 0 10*3/uL (ref 0.0–0.1)
Eosinophils Relative: 3 % (ref 0–5)
HCT: 37.8 % (ref 36.0–46.0)
Hemoglobin: 12.3 g/dL (ref 12.0–15.0)
Lymphocytes Relative: 22 % (ref 12–46)
MCV: 87.1 fL (ref 78.0–100.0)
Monocytes Absolute: 0.6 10*3/uL (ref 0.1–1.0)
Monocytes Relative: 9 % (ref 3–12)
Neutro Abs: 4.3 10*3/uL (ref 1.7–7.7)
RDW: 14.8 % (ref 11.5–15.5)
WBC: 6.3 10*3/uL (ref 4.0–10.5)

## 2013-05-29 LAB — BASIC METABOLIC PANEL
BUN: 14 mg/dL (ref 6–23)
CO2: 25 mEq/L (ref 19–32)
Calcium: 9.7 mg/dL (ref 8.4–10.5)
Chloride: 99 mEq/L (ref 96–112)
Creatinine, Ser: 0.92 mg/dL (ref 0.50–1.10)
Glucose, Bld: 90 mg/dL (ref 70–99)

## 2013-05-29 MED ORDER — MECLIZINE HCL 25 MG PO TABS: 25.0000 mg | ORAL_TABLET | Freq: Three times a day (TID) | ORAL | Status: DC | PRN

## 2013-05-29 NOTE — ED Notes (Signed)
MD at bedside. 

## 2013-12-31 ENCOUNTER — Emergency Department (HOSPITAL_COMMUNITY)
Admission: EM | Admit: 2013-12-31 | Discharge: 2013-12-31 | Disposition: A | Discharge status: Home / Self Care | Payer: Managed Care, Other (non HMO) | Attending: Emergency Medicine | Admitting: Emergency Medicine

## 2013-12-31 ENCOUNTER — Encounter (HOSPITAL_COMMUNITY): Payer: Self-pay | Admitting: Emergency Medicine

## 2013-12-31 DIAGNOSIS — Z791 Long term (current) use of non-steroidal anti-inflammatories (NSAID): Secondary | ICD-10-CM | POA: Insufficient documentation

## 2013-12-31 DIAGNOSIS — R110 Nausea: Secondary | ICD-10-CM | POA: Insufficient documentation

## 2013-12-31 DIAGNOSIS — F411 Generalized anxiety disorder: Secondary | ICD-10-CM | POA: Insufficient documentation

## 2013-12-31 DIAGNOSIS — J3489 Other specified disorders of nose and nasal sinuses: Secondary | ICD-10-CM | POA: Insufficient documentation

## 2013-12-31 DIAGNOSIS — H53149 Visual discomfort, unspecified: Secondary | ICD-10-CM | POA: Insufficient documentation

## 2013-12-31 DIAGNOSIS — M5481 Occipital neuralgia: Secondary | ICD-10-CM

## 2013-12-31 DIAGNOSIS — M531 Cervicobrachial syndrome: Secondary | ICD-10-CM | POA: Insufficient documentation

## 2013-12-31 DIAGNOSIS — F17200 Nicotine dependence, unspecified, uncomplicated: Secondary | ICD-10-CM | POA: Insufficient documentation

## 2013-12-31 DIAGNOSIS — Z79899 Other long term (current) drug therapy: Secondary | ICD-10-CM | POA: Insufficient documentation

## 2013-12-31 DIAGNOSIS — J029 Acute pharyngitis, unspecified: Secondary | ICD-10-CM | POA: Insufficient documentation

## 2013-12-31 MED ORDER — BUPIVACAINE HCL (PF) 0.5 % IJ SOLN
10.0000 mL | Freq: Once | INTRAMUSCULAR | Status: AC
  Administered 2013-12-31: 10 mL
  Filled 2013-12-31: qty 30

## 2013-12-31 MED ORDER — OXYCODONE-ACETAMINOPHEN 5-325 MG PO TABS: 1.0000 | ORAL_TABLET | ORAL | Status: DC | PRN

## 2013-12-31 NOTE — ED Notes (Addendum)
Pt reports headache since Saturday. Pt reports took otc ibuprofen with no relief. Pt reports nausea that started today. No neuro deficits noted in triage. Pt tearful in triage. Pt reports sensitivity to light and sound.

## 2013-12-31 NOTE — ED Notes (Signed)
NAD noted at time of d/c instruction.

## 2013-12-31 NOTE — Discharge Instructions (Signed)
Occipital Neuralgia Neuralgias are attacks of sharp stabbing pain. They may be intermittent (comes and goes) or constant in nature. They may be brief attacks that last seconds to minutes and may come back for days to weeks. The neuralgias can occur as a result of a herpes zoster (shingles), chickenpox infection, or even following a herpes simplex infection (cold sore). TYPES OF NEURALGIA  When these pains are located in the back of the head and neck they are called occipital neuralgias.  When the pain is located between ribs it is called intercostal neuralgia.  When the pain is located in the face it is called trigeminal neuralgia. This is the most common neuralgia. It causes sharp, shock like pain on one side of your face. The neuralgias, which follow herpes zoster infections, often produce a constant burning pain. They may last from weeks to months and even years. The attacks of pain may come from injury or inflammation (irritation) to a nerve. Often the cause is unknown. The episodes of pain may be caused by light touch, movement, or even eating and sneezing. Usually these neuralgias occur after age 68. The neuralgias following shingles and trigeminal neuralgia are the most common. Although painful, these episodes do not threaten life and tend to lessen as we grow older. TREATMENT  There are many medications that may be helpful in the treatment of this disorder. Sometimes several medications may have to be tried before the right combination can be found for you. Some of these medications are:  Only take over-the-counter or prescription medications for pain, discomfort, or fever as directed by your caregiver.  Narcotic medications may be used to control the pain.  Antidepressants and medications used in epilepsy (seizure disorders) may be useful. LET YOUR CAREGIVER KNOW ABOUT:  If you do not obtain relief from medications.  Problems that are getting worse rather than better.  Troubling  side effects that you think are coming from the medication. Do not be discouraged if you do not obtain instant relief from the medications or help given you. Your caregiver can help you get through these episodes of pain with some persistence (continued trying) on your part also. Document Released: 06/22/2001 Document Revised: 09/20/2011 Document Reviewed: 06/20/2013 [facility] Patient Information 2015 [place], [place]. This information is not intended to replace advice given to you by your health care provider. Make sure you discuss any questions you have with your health care provider.

## 2013-12-31 NOTE — ED Provider Notes (Signed)
CSN: [id number]     Arrival date & time 12/31/13  1242 History  This chart was scribed for Virgel Manifold, MD by Elby Beck, ED Scribe. This patient was seen in room APA05/APA05 and the patient's care was started at 3:26 PM.   Chief Complaint  Patient presents with  . Headache    The history is provided by the patient. No language interpreter was used.    HPI Comments: Destiny Beck is a 43 y.o. female who presents to the Emergency Department complaining of an intermittent, severe headache onset 2 days ago. She locates her headache to the right occipital area of her head. She describes her pian as sharp and piercing. She states that her headache is worsened with lying supine. She states that she has a history of prior headaches, which did not last as long as the current headache, and which were not located occipitally. She states that she had a sore, "scracthy" throat 3 days ago, which has now resolved. She states that she has associated congestion and nausea. She states that she has taken 800 mg Ibuprofen with some temporary relief. She denies visual changes, emesis or any other pain or symptoms. Pt states that she has no medication allergies.     Past Medical History  Diagnosis Date  . Anxiety    Past Surgical History  Procedure Laterality Date  . Cesarean section     History reviewed. No pertinent family history. History  Substance Use Topics  . Smoking status: Current Some Day Smoker  . Smokeless tobacco: Not on file  . Alcohol Use: Yes     Comment: Occ   OB History   Grav Para Term Preterm Abortions TAB SAB Ect Mult Living                 Review of Systems  HENT: Positive for congestion and sore throat (resolved).   Eyes: Positive for photophobia. Negative for visual disturbance.  Gastrointestinal: Positive for nausea. Negative for vomiting.  Neurological: Positive for headaches.  All other systems reviewed and are negative.   Allergies  Review of patient's  allergies indicates no known allergies.  Home Medications   Prior to Admission medications   Medication Sig Start Date End Date Taking? Authorizing Provider  ALPRAZolam Duanne Moron) 0.5 MG tablet Take 0.5 mg by mouth 2 (two) times daily as needed for sleep.    Historical Provider, MD  Biotin 5000 MCG TABS Take 1 tablet by mouth daily.    Historical Provider, MD  escitalopram (LEXAPRO) 10 MG tablet Take 10 mg by mouth daily.    Historical Provider, MD  HYDROcodone-acetaminophen (NORCO/VICODIN) 5-325 MG per tablet Take 1-2 tablets by mouth every 6 (six) hours as needed for pain. 12/25/12   Fredia Sorrow, MD  ibuprofen (ADVIL,MOTRIN) 800 MG tablet Take 800 mg by mouth every 8 (eight) hours as needed for pain.    Historical Provider, MD  meclizine (ANTIVERT) 25 MG tablet Take 1 tablet (25 mg total) by mouth 3 (three) times daily as needed for dizziness. 52/84/13   Delora Fuel, MD  methocarbamol (ROBAXIN) 500 MG tablet Take 1 tablet (500 mg total) by mouth 2 (two) times daily as needed. 05/10/13   Johnna Acosta, MD  naproxen (NAPROSYN) 500 MG tablet Take 1 tablet (500 mg total) by mouth 2 (two) times daily. 12/25/12   Fredia Sorrow, MD  naproxen (NAPROSYN) 500 MG tablet Take 1 tablet (500 mg total) by mouth 2 (two) times daily with a meal. 05/10/13  Johnna Acosta, MD   Triage Vitals: BP 144/88  Pulse 89  Temp(Src) 98.1 F (36.7 C) (Oral)  Resp 18  Ht 5' (1.524 m)  Wt 157 lb (71.215 kg)  BMI 30.66 kg/m2  SpO2 100%  LMP 12/28/2013  Physical Exam  Nursing note and vitals reviewed. Constitutional: She is oriented to person, place, and time. She appears well-developed and well-nourished. No distress.  HENT:  Head: Normocephalic and atraumatic.  Scalp tenderness at the area of the right greater occipital nerve.   Eyes: EOM are normal.  Neck: Normal range of motion.  Cardiovascular: Normal rate, regular rhythm and normal heart sounds.   Pulmonary/Chest: Effort normal and breath sounds normal.   Abdominal: Soft. She exhibits no distension. There is no tenderness.  Musculoskeletal: Normal range of motion.  Neurological: She is alert and oriented to person, place, and time. No cranial nerve deficit. She exhibits normal muscle tone. Coordination normal.  CN intact. Strength 5/5 in upper and lower extremities.   Skin: Skin is warm and dry.  Psychiatric: She has a normal mood and affect. Judgment normal.    ED Course  NERVE BLOCK Date/Time: 12/31/2013 4:00 PM Performed by: Virgel Manifold Authorized by: Virgel Manifold Consent: Verbal consent obtained. Risks and benefits: risks, benefits and alternatives were discussed Consent given by: patient Patient identity confirmed: verbally with patient and provided demographic data Indications: pain relief Body area: head Nerve: greater occipital Laterality: right Patient sedated: no Preparation: Patient was prepped and draped in the usual sterile fashion. Patient position: sitting Needle gauge: 25 G Local anesthetic: bupivacaine 0.5% without epinephrine Anesthetic total: 1.5 ml Outcome: pain improved Patient tolerance: Patient tolerated the procedure well with no immediate complications.   (including critical care time)    DIAGNOSTIC STUDIES: Oxygen Saturation is 100% on RA, normal by my interpretation.    COORDINATION OF CARE: 3:30 PM- Discussed plan to order medications. Pt advised of plan for treatment and pt agrees.  Labs Review Labs Reviewed - No data to display  Imaging Review No results found.   EKG Interpretation None      MDM   Final diagnoses:  Occipital neuralgia of right side    43 year old female with headache. Symptoms consistent with occipital neuralgia. Scalp tenderness further supports this. She was given a greater occipital nerve block with improvement of symptoms. Further symptomatic tx as needed. Very low suspicion for bleed, infectious or other emergent secondary cause of symptoms. Return  precautions discussed.    I personally preformed the services scribed in my presence. The recorded information has been reviewed is accurate. Virgel Manifold, MD.   Virgel Manifold, MD 01/03/14 [phone]

## 2014-01-31 DIAGNOSIS — M5030 Other cervical disc degeneration, unspecified cervical region: Secondary | ICD-10-CM | POA: Insufficient documentation

## 2014-01-31 DIAGNOSIS — F410 Panic disorder [episodic paroxysmal anxiety] without agoraphobia: Secondary | ICD-10-CM | POA: Insufficient documentation

## 2014-02-07 ENCOUNTER — Encounter (INDEPENDENT_AMBULATORY_CARE_PROVIDER_SITE_OTHER): Payer: Self-pay | Admitting: *Deleted

## 2014-03-12 ENCOUNTER — Ambulatory Visit (INDEPENDENT_AMBULATORY_CARE_PROVIDER_SITE_OTHER): Payer: Managed Care, Other (non HMO) | Admitting: Internal Medicine

## 2014-04-03 ENCOUNTER — Encounter (INDEPENDENT_AMBULATORY_CARE_PROVIDER_SITE_OTHER): Payer: Self-pay | Admitting: *Deleted

## 2014-04-03 ENCOUNTER — Telehealth (INDEPENDENT_AMBULATORY_CARE_PROVIDER_SITE_OTHER): Payer: Self-pay | Admitting: *Deleted

## 2014-04-03 NOTE — Telephone Encounter (Signed)
Destiny Beck SHOWED for her apt with [name], NP on 03/12/14. A NS letter has been mailed.

## 2015-08-24 ENCOUNTER — Encounter (HOSPITAL_COMMUNITY): Payer: Self-pay | Admitting: *Deleted

## 2015-08-24 ENCOUNTER — Emergency Department (HOSPITAL_COMMUNITY)
Admission: EM | Admit: 2015-08-24 | Discharge: 2015-08-24 | Disposition: A | Discharge status: Home / Self Care | Payer: Managed Care, Other (non HMO) | Attending: Emergency Medicine | Admitting: Emergency Medicine

## 2015-08-24 DIAGNOSIS — Z791 Long term (current) use of non-steroidal anti-inflammatories (NSAID): Secondary | ICD-10-CM | POA: Insufficient documentation

## 2015-08-24 DIAGNOSIS — F419 Anxiety disorder, unspecified: Secondary | ICD-10-CM | POA: Insufficient documentation

## 2015-08-24 DIAGNOSIS — L299 Pruritus, unspecified: Secondary | ICD-10-CM | POA: Insufficient documentation

## 2015-08-24 DIAGNOSIS — D649 Anemia, unspecified: Secondary | ICD-10-CM | POA: Insufficient documentation

## 2015-08-24 DIAGNOSIS — Z79899 Other long term (current) drug therapy: Secondary | ICD-10-CM | POA: Insufficient documentation

## 2015-08-24 DIAGNOSIS — F17200 Nicotine dependence, unspecified, uncomplicated: Secondary | ICD-10-CM | POA: Insufficient documentation

## 2015-08-24 LAB — COMPREHENSIVE METABOLIC PANEL
ALT: 14 U/L (ref 14–54)
AST: 18 U/L (ref 15–41)
Albumin: 3.9 g/dL (ref 3.5–5.0)
Alkaline Phosphatase: 72 U/L (ref 38–126)
Anion gap: 7 (ref 5–15)
BILIRUBIN TOTAL: 0.3 mg/dL (ref 0.3–1.2)
BUN: 14 mg/dL (ref 6–20)
CHLORIDE: 107 mmol/L (ref 101–111)
CO2: 27 mmol/L (ref 22–32)
Calcium: 8.9 mg/dL (ref 8.9–10.3)
Creatinine, Ser: 0.86 mg/dL (ref 0.44–1.00)
GFR calc Af Amer: >60 mL/min (ref >60)
GFR calc non Af Amer: >60 mL/min (ref >60)
GLUCOSE: 103 mg/dL — AB (ref 65–99)
POTASSIUM: 3.7 mmol/L (ref 3.5–5.1)
Sodium: 141 mmol/L (ref 135–145)
Total Protein: 6.4 g/dL — ABNORMAL LOW (ref 6.5–8.1)

## 2015-08-24 LAB — CBC
HEMATOCRIT: 31 % — AB (ref 36.0–46.0)
Hemoglobin: 9.7 g/dL — ABNORMAL LOW (ref 12.0–15.0)
MCH: 26.3 pg (ref 26.0–34.0)
MCHC: 31.3 g/dL (ref 30.0–36.0)
MCV: 84 fL (ref 78.0–100.0)
Platelets: 226 10*3/uL (ref 150–400)
RBC: 3.69 MIL/uL — ABNORMAL LOW (ref 3.87–5.11)
RDW: 15.4 % (ref 11.5–15.5)
WBC: 6.2 10*3/uL (ref 4.0–10.5)

## 2015-08-24 MED ORDER — HYDROXYZINE HCL 25 MG PO TABS: 25.0000 mg | ORAL_TABLET | Freq: Four times a day (QID) | ORAL | Status: DC | PRN

## 2015-08-24 MED ORDER — HYDROXYZINE HCL 25 MG PO TABS
50.0000 mg | ORAL_TABLET | Freq: Once | ORAL | Status: AC
  Administered 2015-08-24: 50 mg via ORAL
  Filled 2015-08-24: qty 2

## 2015-08-24 NOTE — ED Provider Notes (Signed)
CSN: [id number]     Arrival date & time 08/24/15  0331 History   First MD Initiated Contact with Patient 08/24/15 0359     Chief Complaint  Patient presents with  . Pruritis     (Consider location/radiation/quality/duration/timing/severity/associated sxs/prior Treatment) HPI  45 year old female presents with a chief complaint of itching. This issue has been going on for 1-1/2 months. Worse over the last couple weeks. Denies seeing any type of rash. Typically it is worse in her posterior thighs and her groin/bikini line. Occasionally it is in her back. It woke her up out of sleep and she is tired of this if she came to the ER. States that it frequently is worse at night. She has been trying Eucerin cream as well as other topical treatments with occasional 25 mg of Benadryl. The Benadryl helps very transiently. No fevers. Is currently on Wellbutrin, Xanax, and Lexapro but these have been chronic medications with no recent changes.  Past Medical History  Diagnosis Date  . Anxiety    Past Surgical History  Procedure Laterality Date  . Cesarean section     No family history on file. Social History  Substance Use Topics  . Smoking status: Current Some Day Smoker  . Smokeless tobacco: None  . Alcohol Use: Yes     Comment: Occ   OB History    No data available     Review of Systems  Constitutional: Negative for fever.  Gastrointestinal: Negative for abdominal pain.  Genitourinary: Negative for vaginal discharge and vaginal pain.  Skin: Negative for color change, rash and wound.  All other systems reviewed and are negative.     Allergies  Review of patient's allergies indicates no known allergies.  Home Medications   Prior to Admission medications   Medication Sig Start Date End Date Taking? Authorizing Provider  buPROPion (WELLBUTRIN) 100 MG tablet Take 100 mg by mouth daily.   Yes Historical Provider, MD  escitalopram (LEXAPRO) 10 MG tablet Take 10 mg by mouth daily.   Yes  Historical Provider, MD  ibuprofen (ADVIL,MOTRIN) 800 MG tablet Take 800 mg by mouth every 8 (eight) hours as needed for pain.   Yes Historical Provider, MD  ALPRAZolam Duanne Moron) 0.5 MG tablet Take 0.5 mg by mouth 2 (two) times daily as needed for sleep.    Historical Provider, MD  Biotin 5000 MCG TABS Take 1 tablet by mouth daily.    Historical Provider, MD  HYDROcodone-acetaminophen (NORCO/VICODIN) 5-325 MG per tablet Take 1-2 tablets by mouth every 6 (six) hours as needed for pain. 12/25/12   Fredia Sorrow, MD  meclizine (ANTIVERT) 25 MG tablet Take 1 tablet (25 mg total) by mouth 3 (three) times daily as needed for dizziness. [id number]   Delora Fuel, MD  methocarbamol (ROBAXIN) 500 MG tablet Take 1 tablet (500 mg total) by mouth 2 (two) times daily as needed. 05/10/13   Noemi Chapel, MD  naproxen (NAPROSYN) 500 MG tablet Take 1 tablet (500 mg total) by mouth 2 (two) times daily. 12/25/12   Fredia Sorrow, MD  naproxen (NAPROSYN) 500 MG tablet Take 1 tablet (500 mg total) by mouth 2 (two) times daily with a meal. 05/10/13   Noemi Chapel, MD  oxyCODONE-acetaminophen (PERCOCET/ROXICET) 5-325 MG per tablet Take 1-2 tablets by mouth every 4 (four) hours as needed for severe pain. 12/31/13   Virgel Manifold, MD   BP 105/74 mmHg  Pulse 89  Temp(Src) 98.2 F (36.8 C) (Oral)  Resp 16  Ht 5' (1.524 m)  Wt 149 lb (67.586 kg)  BMI 29.10 kg/m2  SpO2 99%  LMP 08/10/2015 Physical Exam  Constitutional: She is oriented to person, place, and time. She appears well-developed and well-nourished.  HENT:  Head: Normocephalic and atraumatic.  Right Ear: External ear normal.  Left Ear: External ear normal.  Nose: Nose normal.  Eyes: Right eye exhibits no discharge. Left eye exhibits no discharge.  Cardiovascular: Normal rate, regular rhythm and normal heart sounds.   Pulmonary/Chest: Effort normal and breath sounds normal.  Abdominal: Soft. She exhibits no distension. There is no tenderness.  Neurological:  She is alert and oriented to person, place, and time.  Skin: Skin is warm and dry.  No focal rash. Occasional intermittent small papules in groin but no focal rash or irritation.  No jaundice  Nursing note and vitals reviewed.   ED Course  Procedures (including critical care time) Labs Review Labs Reviewed  CBC - Abnormal; Notable for the following:    RBC 3.69 (*)    Hemoglobin 9.7 (*)    HCT 31.0 (*)    All other components within normal limits  COMPREHENSIVE METABOLIC PANEL - Abnormal; Notable for the following:    Glucose, Bld 103 (*)    Total Protein 6.4 (*)    All other components within normal limits    Imaging Review No results found. I have personally reviewed and evaluated these images and lab results as part of my medical decision-making.   EKG Interpretation None      MDM   Final diagnoses:  Pruritus  Anemia, unspecified anemia type    Is unclear exactly why the patient is so itchy. She has small red bumps that do not seem to correlate with the degree of pruritus. No focal tenderness. Given no obvious source labs including LFTs were evaluated. Has nonspecific anemia that is most likely chronic given patient relates a long-standing history of heavy periods and prior history of anemia. She is not symptomatic from this perspective. Will recommend iron at home. We'll treat with Atarax as this is somewhat improved her symptoms in the ER. Discussed follow-up closely with PCP and possible dermatology referral. Discharge home.    Sherwood Gambler, MD 08/24/15 [phone]

## 2015-08-24 NOTE — ED Notes (Signed)
Pt c/o itching to groin, back of upper thighs that started a few months ago but has gotten worse

## 2015-11-04 ENCOUNTER — Other Ambulatory Visit (HOSPITAL_COMMUNITY): Payer: Self-pay | Admitting: Adult Health Nurse Practitioner

## 2015-11-04 DIAGNOSIS — Z23 Encounter for immunization: Secondary | ICD-10-CM | POA: Insufficient documentation

## 2015-11-04 DIAGNOSIS — Z1231 Encounter for screening mammogram for malignant neoplasm of breast: Secondary | ICD-10-CM

## 2015-11-06 ENCOUNTER — Encounter (INDEPENDENT_AMBULATORY_CARE_PROVIDER_SITE_OTHER): Payer: Self-pay | Admitting: *Deleted

## 2015-11-07 ENCOUNTER — Ambulatory Visit (HOSPITAL_COMMUNITY): Payer: Managed Care, Other (non HMO)

## 2015-11-10 ENCOUNTER — Encounter (HOSPITAL_COMMUNITY): Payer: Self-pay | Admitting: Emergency Medicine

## 2015-11-10 ENCOUNTER — Emergency Department (HOSPITAL_COMMUNITY)
Admission: EM | Admit: 2015-11-10 | Discharge: 2015-11-10 | Disposition: A | Discharge status: Home / Self Care | Payer: Managed Care, Other (non HMO) | Attending: Emergency Medicine | Admitting: Emergency Medicine

## 2015-11-10 DIAGNOSIS — L02411 Cutaneous abscess of right axilla: Secondary | ICD-10-CM

## 2015-11-10 DIAGNOSIS — F17200 Nicotine dependence, unspecified, uncomplicated: Secondary | ICD-10-CM | POA: Insufficient documentation

## 2015-11-10 HISTORY — DX: Cutaneous abscess, unspecified: L02.91

## 2015-11-10 MED ORDER — HYDROCODONE-ACETAMINOPHEN 5-325 MG PO TABS
1.0000 | ORAL_TABLET | Freq: Once | ORAL | Status: AC
  Administered 2015-11-10: 1 via ORAL
  Filled 2015-11-10: qty 1

## 2015-11-10 MED ORDER — POVIDONE-IODINE 10 % EX SOLN
CUTANEOUS | Status: AC
  Administered 2015-11-10: 19:00:00
  Filled 2015-11-10: qty 118

## 2015-11-10 MED ORDER — SULFAMETHOXAZOLE-TRIMETHOPRIM 800-160 MG PO TABS
1.0000 | ORAL_TABLET | Freq: Once | ORAL | Status: AC
  Administered 2015-11-10: 1 via ORAL
  Filled 2015-11-10: qty 1

## 2015-11-10 MED ORDER — HYDROCODONE-ACETAMINOPHEN 5-325 MG PO TABS: 1.0000 | ORAL_TABLET | ORAL | Status: DC | PRN

## 2015-11-10 MED ORDER — SULFAMETHOXAZOLE-TRIMETHOPRIM 800-160 MG PO TABS: 1.0000 | ORAL_TABLET | Freq: Two times a day (BID) | ORAL | Status: AC

## 2015-11-10 MED ORDER — LIDOCAINE HCL (PF) 1 % IJ SOLN
5.0000 mL | Freq: Once | INTRAMUSCULAR | Status: AC
  Administered 2015-11-10: 5 mL
  Filled 2015-11-10: qty 5

## 2015-11-10 NOTE — ED Notes (Signed)
Pt reports an abscess underneath RT arm. Denies fever/chills. Denies n/v/d.

## 2015-11-10 NOTE — ED Provider Notes (Signed)
CSN: [id number]     Arrival date & time 11/10/15  1727 History  By signing my name below, I, Destiny Beck, attest that this documentation has been prepared under the direction and in the presence of Debroah Baller, NP. Electronically Signed: Eustaquio Beck, ED Scribe. 11/10/2015. 6:29 PM.   Chief Complaint  Patient presents with  . Abscess   Patient is a 45 y.o. female presenting with abscess. The history is provided by the patient. No language interpreter was used.  Abscess Abscess location: Underneath right arm. Size:  3 cm  Abscess quality: fluctuance and redness   Abscess quality: not draining   Red streaking: no   Duration:  3 days Progression:  Worsening Chronicity:  New Context: not insect bite/sting and not skin injury   Ineffective treatments:  NSAIDs and warm compresses Associated symptoms: no fever, no nausea and no vomiting      HPI Comments: Destiny Beck is a 45 y.o. female who presents to the Emergency Department complaining of gradual onset, constant, area of redness, swelling, and pain underneath right arm that began a couple of days ago. Pt has been taking Ibuprofen and applying warm compresses without relief. No drainage to the area. Denies fever, chills, or any other associated symptoms.   Past Medical History  Diagnosis Date  . Anxiety   . Abscess    Past Surgical History  Procedure Laterality Date  . Cesarean section     No family history on file. Social History  Substance Use Topics  . Smoking status: Current Some Day Smoker  . Smokeless tobacco: None  . Alcohol Use: Yes     Comment: Occ   OB History    No data available     Review of Systems  Constitutional: Negative for fever and chills.  Gastrointestinal: Negative for nausea and vomiting.  Skin:       + Redness, swelling, and pain underneath right arm. No drainage.   All other systems reviewed and are negative.  Allergies  Review of patient's allergies indicates no known allergies.  Home  Medications   Prior to Admission medications   Medication Sig Start Date End Date Taking? Authorizing Provider  ALPRAZolam Duanne Moron) 0.5 MG tablet Take 0.5 mg by mouth 2 (two) times daily as needed for sleep.    Historical Provider, MD  Biotin 5000 MCG TABS Take 1 tablet by mouth daily.    Historical Provider, MD  buPROPion (WELLBUTRIN) 100 MG tablet Take 100 mg by mouth daily.    Historical Provider, MD  escitalopram (LEXAPRO) 10 MG tablet Take 10 mg by mouth daily.    Historical Provider, MD  HYDROcodone-acetaminophen (NORCO/VICODIN) 5-325 MG tablet Take 1 tablet by mouth every 4 (four) hours as needed. 11/10/15   Adriane Gabbert Bunnie Pion, NP  hydrOXYzine (ATARAX/VISTARIL) 25 MG tablet Take 1 tablet (25 mg total) by mouth every 6 (six) hours as needed for itching. 08/24/15   Sherwood Gambler, MD  ibuprofen (ADVIL,MOTRIN) 800 MG tablet Take 800 mg by mouth every 8 (eight) hours as needed for pain.    Historical Provider, MD  meclizine (ANTIVERT) 25 MG tablet Take 1 tablet (25 mg total) by mouth 3 (three) times daily as needed for dizziness. [id number]   Delora Fuel, MD  methocarbamol (ROBAXIN) 500 MG tablet Take 1 tablet (500 mg total) by mouth 2 (two) times daily as needed. 05/10/13   Noemi Chapel, MD  naproxen (NAPROSYN) 500 MG tablet Take 1 tablet (500 mg total) by mouth 2 (two) times  daily. 12/25/12   Fredia Sorrow, MD  naproxen (NAPROSYN) 500 MG tablet Take 1 tablet (500 mg total) by mouth 2 (two) times daily with a meal. 05/10/13   Noemi Chapel, MD  sulfamethoxazole-trimethoprim (BACTRIM DS,SEPTRA DS) 800-160 MG tablet Take 1 tablet by mouth 2 (two) times daily. 11/10/15 11/17/15  Jamond Neels Bunnie Pion, NP   BP 124/82 mmHg  Pulse 67  Temp(Src) 98.4 F (36.9 C) (Oral)  Resp 18  Ht 5\' 5"  (1.651 m)  Wt 68.04 kg  BMI 24.96 kg/m2  SpO2 100%  LMP 11/08/2015   Physical Exam  Constitutional: She is oriented to person, place, and time. She appears well-developed and well-nourished. No distress.  HENT:  Head:  Normocephalic and atraumatic.  Eyes: Conjunctivae and EOM are normal.  Neck: Neck supple. No tracheal deviation present.  Cardiovascular: Normal rate.   Pulmonary/Chest: Effort normal. No respiratory distress.  Musculoskeletal: Normal range of motion. She exhibits tenderness.  There is a 3 cm area to the right axilla that is tender, raised, with erythema with a fluctuant pustular center.   Neurological: She is alert and oriented to person, place, and time.  Skin: Skin is warm and dry.  Psychiatric: She has a normal mood and affect. Her behavior is normal.  Nursing note and vitals reviewed.   ED Course  Procedures (including critical care time)  INCISION AND DRAINAGE PROCEDURE NOTE: Patient identification was confirmed and verbal consent was obtained. This procedure was performed by Debroah Baller, NP at 6:55 PM. Site: Underneath right arm Sterile procedures observed Needle size: 25 Anesthetic used (type and amt): 2 CC's 1% Lidocaine Blade size: 11 Drainage: Small amount of purulent bloody drainage Complexity: Complex Packing used: 1/4" Iodoform Site anesthetized, incision made over site, wound drained and explored loculations, rinsed with copious amounts of normal saline, wound packed with sterile gauze, covered with dry, sterile dressing.  Pt tolerated procedure well without complications.  Instructions for care discussed verbally and pt provided with additional written instructions for homecare and f/u.   DIAGNOSTIC STUDIES: Oxygen Saturation is 100% on RA, normal by my interpretation.    COORDINATION OF CARE: 6:28 PM-Discussed treatment plan which includes I&D with pt at bedside and pt agreed to plan.    MDM  45 y.o. female with abscess to the right axilla stable for d/c without fever and does not appear toxic. Discussed with the patient plan of care and all questioned fully answered. She will follow up in 2 days for packing removal or sooner if any problems arise.  Final  diagnoses:  Abscess of right axilla   I personally performed the services described in this documentation, which was scribed in my presence. The recorded information has been reviewed and is accurate.      [place], [place] 11/12/15 [place], MD 11/13/15 1345

## 2015-11-10 NOTE — ED Notes (Signed)
Pt states understanding of care given and follow up instructions.  Will return in 2 days for wound recheck and packing change.

## 2015-11-10 NOTE — Discharge Instructions (Signed)

## 2015-11-26 ENCOUNTER — Ambulatory Visit (INDEPENDENT_AMBULATORY_CARE_PROVIDER_SITE_OTHER): Payer: Managed Care, Other (non HMO) | Admitting: Internal Medicine

## 2015-11-26 ENCOUNTER — Encounter (INDEPENDENT_AMBULATORY_CARE_PROVIDER_SITE_OTHER): Payer: Self-pay | Admitting: *Deleted

## 2016-04-12 ENCOUNTER — Emergency Department (HOSPITAL_COMMUNITY)
Admission: EM | Admit: 2016-04-12 | Discharge: 2016-04-12 | Disposition: A | Discharge status: Home / Self Care | Payer: Self-pay | Attending: Emergency Medicine | Admitting: Emergency Medicine

## 2016-04-12 ENCOUNTER — Encounter (HOSPITAL_COMMUNITY): Payer: Self-pay

## 2016-04-12 DIAGNOSIS — F17200 Nicotine dependence, unspecified, uncomplicated: Secondary | ICD-10-CM | POA: Insufficient documentation

## 2016-04-12 DIAGNOSIS — L0231 Cutaneous abscess of buttock: Secondary | ICD-10-CM | POA: Insufficient documentation

## 2016-04-12 MED ORDER — HYDROCODONE-ACETAMINOPHEN 5-325 MG PO TABS
1.0000 | ORAL_TABLET | Freq: Once | ORAL | Status: AC
Start: 2016-04-12 — End: 2016-04-12
  Administered 2016-04-12: 1 via ORAL
  Filled 2016-04-12: qty 1

## 2016-04-12 MED ORDER — DOXYCYCLINE HYCLATE 100 MG PO CAPS: 100.0000 mg | ORAL_CAPSULE | Freq: Two times a day (BID) | ORAL | 0 refills | Status: DC

## 2016-04-12 MED ORDER — LIDOCAINE-EPINEPHRINE (PF) 2 %-1:200000 IJ SOLN
INTRAMUSCULAR | Status: AC
  Administered 2016-04-12: 20 mL via INTRADERMAL
  Filled 2016-04-12: qty 20

## 2016-04-12 MED ORDER — LIDOCAINE-EPINEPHRINE (PF) 2 %-1:200000 IJ SOLN
20.0000 mL | Freq: Once | INTRAMUSCULAR | Status: AC
  Administered 2016-04-12: 20 mL via INTRADERMAL

## 2016-04-12 MED ORDER — LIDOCAINE HCL (PF) 1 % IJ SOLN
INTRAMUSCULAR | Status: AC
  Filled 2016-04-12: qty 5

## 2016-04-12 NOTE — ED Notes (Signed)
MD at bedside. 

## 2016-04-12 NOTE — ED Triage Notes (Signed)
Pt reports abscess to buttocks for past 6 days or so.  Pt took hydrocodone around midnight last night.

## 2016-04-12 NOTE — Discharge Instructions (Signed)
Please soak your wound three times a day and as needed after bowel movements etc. Take your antibiotic twice a day for 5 days. Follow up with your primary care in 1 week for wound recheck.  Here are some things you can do to prevent these in the future: -Do not wear tight-fitting clothes -Try to avoid activities that cause your skin to rub against itself a lot -Shower every day and wash affected areas gently with your fingers. Do not scrub affected areas with a washcloth, loofah, or brush. -Stop smoking, if you smoke. -Lose weight, if you are overweight.

## 2016-04-12 NOTE — ED Notes (Signed)
Supplies at bedside.

## 2016-04-12 NOTE — ED Provider Notes (Signed)
[facility] DEPT Provider Note   CSN: [id number] Arrival date & time: 04/12/16  0809  History   Chief Complaint Chief Complaint  Patient presents with  . Abscess    HPI Destiny Beck is a 45 year old woman with history of abscesses.  HPI She presents with an abscess on her right buttock for the past 5-6 days. Denies drainage. She has tried taking tylenol and ibuprofen with minimal relief. She took some hydrocodone last night with some relief in pain. She has also tried hot compresses. She denies fevers or chills, nausea, vomiting, abdominal pain, diarrhea. She has back upper leg and lower back pain which she attributes to her posture from trying to avoid putting pressure on the abscess.  Past Medical History:  Diagnosis Date  . Abscess   . Anxiety    Past Surgical History:  Procedure Laterality Date  . CESAREAN SECTION      Home Medications    Prior to Admission medications   Medication Sig Start Date End Date Taking? Authorizing Provider  ALPRAZolam Duanne Moron) 0.5 MG tablet Take 0.5 mg by mouth 2 (two) times daily as needed for sleep.    Historical Provider, MD  Biotin 5000 MCG TABS Take 1 tablet by mouth daily.    Historical Provider, MD  buPROPion (WELLBUTRIN) 100 MG tablet Take 100 mg by mouth daily.    Historical Provider, MD  escitalopram (LEXAPRO) 10 MG tablet Take 10 mg by mouth daily.    Historical Provider, MD  HYDROcodone-acetaminophen (NORCO/VICODIN) 5-325 MG tablet Take 1 tablet by mouth every 4 (four) hours as needed. 11/10/15   Hope Bunnie Pion, NP  hydrOXYzine (ATARAX/VISTARIL) 25 MG tablet Take 1 tablet (25 mg total) by mouth every 6 (six) hours as needed for itching. 08/24/15   Sherwood Gambler, MD  ibuprofen (ADVIL,MOTRIN) 800 MG tablet Take 800 mg by mouth every 8 (eight) hours as needed for pain.    Historical Provider, MD  meclizine (ANTIVERT) 25 MG tablet Take 1 tablet (25 mg total) by mouth 3 (three) times daily as needed for dizziness. [id number]   Delora Fuel,  MD  methocarbamol (ROBAXIN) 500 MG tablet Take 1 tablet (500 mg total) by mouth 2 (two) times daily as needed. 05/10/13   Noemi Chapel, MD  naproxen (NAPROSYN) 500 MG tablet Take 1 tablet (500 mg total) by mouth 2 (two) times daily. 12/25/12   Fredia Sorrow, MD  naproxen (NAPROSYN) 500 MG tablet Take 1 tablet (500 mg total) by mouth 2 (two) times daily with a meal. 05/10/13   Noemi Chapel, MD    Family History No family history on file.  Social History Social History  Substance Use Topics  . Smoking status: Current Some Day Smoker  . Smokeless tobacco: Never Used  . Alcohol use Yes     Comment: Occ     Allergies   Review of patient's allergies indicates no known allergies.   Review of Systems Review of Systems Constitutional: no fevers/chills Eyes: no vision changes Ears, nose, mouth, throat, and face: no cough Respiratory: no shortness of breath Cardiovascular: no chest pain Gastrointestinal: no nausea/vomiting, no abdominal pain, no constipation, no diarrhea Genitourinary: no dysuria, no hematuria Integument: see HPI Hematologic/lymphatic: no bleeding/bruising, no edema Musculoskeletal: no arthralgias, no myalgias Neurological: no paresthesias, no weakness  Physical Exam Updated Vital Signs BP 114/71 (BP Location: Left Arm)   Pulse 64   Temp 98 F (36.7 C) (Oral)   Resp 20   Ht 5' (1.524 m)   Wt 68  kg   LMP 03/13/2016   SpO2 100%   BMI 29.29 kg/m   Physical Exam General Apperance: NAD Head: Normocephalic, atraumatic Eyes: PERRL, EOMI, anicteric sclera Ears: Normal external ear canal Nose: Nares normal, septum midline, mucosa normal Throat: Lips, mucosa and tongue normal  Neck: Supple, trachea midline Back: No tenderness or bony abnormality  Lungs: Clear to auscultation bilaterally. No wheezes, rhonchi or rales. Breathing comfortably Chest Wall: Nontender, no deformity Heart: Regular rate and rhythm, no murmur/rub/gallop Abdomen: Soft, nontender,  nondistended, no rebound/guarding Extremities: Warm and well perfused, no edema Pulses: 2+ throughout Skin: 4cm area of induration and erythema on right buttock, 1cm area of erythema on right upper medial thigh with dark center  Neurologic: Alert and oriented x 3. CNII-XII intact. Normal strength and sensation  ED Treatments / Results   Radiology Bedside U/S performed. Abscess identified about 1cm from surface of skin.  Procedures .Marland KitchenIncision and Drainage Date/Time: 04/12/2016 10:26 AM Performed by: Jacques Earthly T Authorized by: Jacques Earthly T   Consent:    Consent obtained:  Verbal   Consent given by:  Patient   Risks discussed:  Bleeding, incomplete drainage, pain, infection and damage to other organs   Alternatives discussed:  Observation Universal protocol:    Patient identity confirmed:  Verbally with patient Location:    Type:  Abscess   Location:  Lower extremity   Lower extremity location:  Buttock   Buttock location:  R buttock Pre-procedure details:    Skin preparation:  Betadine Anesthesia (see MAR for exact dosages):    Anesthesia method:  Local infiltration   Local anesthetic:  Lidocaine 2% WITH epi Procedure type:    Complexity:  Simple Procedure details:    Incision types:  Single straight   Incision depth:  Subcutaneous   Scalpel blade:  10   Wound management:  Probed and deloculated   Drainage:  Purulent and bloody   Drainage amount:  Moderate   Wound treatment:  Wound left open   Packing materials:  None Post-procedure details:    Patient tolerance of procedure:  Tolerated well, no immediate complications    Medications Ordered in ED Medications  lidocaine (PF) (XYLOCAINE) 1 % injection (  Return to [facility] 04/12/16 0933)  lidocaine-EPINEPHrine (XYLOCAINE W/EPI) 2 %-1:200000 (PF) injection 20 mL (20 mLs Intradermal Given by Other 04/12/16 1019)  HYDROcodone-acetaminophen (NORCO/VICODIN) 5-325 MG per tablet 1 tablet (1 tablet Oral Given 04/12/16  1022)    Initial Impression / Assessment and Plan / ED Course  I have reviewed the triage vital signs and the nursing notes.  Pertinent labs & imaging results that were available during my care of the patient were reviewed by me and considered in my medical decision making (see chart for details).  Clinical Course  10:15AM Abscess drained  Final Clinical Impressions(s) / ED Diagnoses   Final diagnoses:  Abscess of buttock, right  Possible hidradenitis suppurativa given history. I&D done. Sitz baths TID and as needed Doxycycline 100mg  BID x 5 days. She denies possibility of pregnancy. Precautions discussed. Follow up with PCP in 1 week  New Prescriptions New Prescriptions   No medications on file     Milagros Loll, MD 04/12/16 [place]    Elnora Morrison, MD 04/13/16 1525

## 2016-05-22 ENCOUNTER — Encounter (HOSPITAL_COMMUNITY): Payer: Self-pay

## 2016-05-22 ENCOUNTER — Emergency Department (HOSPITAL_COMMUNITY)
Admission: EM | Admit: 2016-05-22 | Discharge: 2016-05-22 | Disposition: A | Discharge status: Home / Self Care | Payer: Self-pay | Attending: Emergency Medicine | Admitting: Emergency Medicine

## 2016-05-22 DIAGNOSIS — F17200 Nicotine dependence, unspecified, uncomplicated: Secondary | ICD-10-CM | POA: Insufficient documentation

## 2016-05-22 DIAGNOSIS — G44209 Tension-type headache, unspecified, not intractable: Secondary | ICD-10-CM | POA: Insufficient documentation

## 2016-05-22 MED ORDER — METOCLOPRAMIDE HCL 5 MG/ML IJ SOLN
10.0000 mg | Freq: Once | INTRAMUSCULAR | Status: AC
  Administered 2016-05-22: 10 mg via INTRAMUSCULAR
  Filled 2016-05-22: qty 2

## 2016-05-22 MED ORDER — DIPHENHYDRAMINE HCL 50 MG/ML IJ SOLN
25.0000 mg | Freq: Once | INTRAMUSCULAR | Status: AC
  Administered 2016-05-22: 25 mg via INTRAMUSCULAR
  Filled 2016-05-22: qty 1

## 2016-05-22 MED ORDER — KETOROLAC TROMETHAMINE 60 MG/2ML IM SOLN
60.0000 mg | Freq: Once | INTRAMUSCULAR | Status: AC
  Administered 2016-05-22: 60 mg via INTRAMUSCULAR
  Filled 2016-05-22: qty 2

## 2016-05-22 NOTE — Discharge Instructions (Signed)
See your Physicain for recheck next week.  

## 2016-05-22 NOTE — ED Provider Notes (Signed)
[facility] DEPT Provider Note   CSN: [id number] Arrival date & time: 05/22/16  1009     History   Chief Complaint Chief Complaint  Patient presents with  . Neck Pain    HPI Destiny Beck is a 45 y.o. female.  The history is provided by the patient. No language interpreter was used.  Headache   This is a new problem. The current episode started more than 2 days ago. The problem occurs constantly. The problem has been gradually worsening. The headache is associated with nothing. The pain is at a severity of 4/10. The pain is moderate. The pain does not radiate. Associated symptoms include nausea. She has tried nothing for the symptoms.   Pt has a history of occipital neuralgia.  Pt reports she has been referred to Neurology but has not ween.  Past Medical History:  Diagnosis Date  . Abscess   . Anxiety     There are no active problems to display for this patient.   Past Surgical History:  Procedure Laterality Date  . CESAREAN SECTION      OB History    No data available       Home Medications    Prior to Admission medications   Medication Sig Start Date End Date Taking? Authorizing Provider  ALPRAZolam Duanne Moron) 0.5 MG tablet Take 0.5 mg by mouth 2 (two) times daily as needed for sleep.    Historical Provider, MD  Biotin 5000 MCG TABS Take 1 tablet by mouth daily.    Historical Provider, MD  buPROPion (WELLBUTRIN) 100 MG tablet Take 100 mg by mouth daily.    Historical Provider, MD  doxycycline (VIBRAMYCIN) 100 MG capsule Take 1 capsule (100 mg total) by mouth 2 (two) times daily. 04/12/16   Milagros Loll, MD  escitalopram (LEXAPRO) 10 MG tablet Take 10 mg by mouth daily.    Historical Provider, MD  HYDROcodone-acetaminophen (NORCO/VICODIN) 5-325 MG tablet Take 1 tablet by mouth every 4 (four) hours as needed. 11/10/15   Hope Bunnie Pion, NP  hydrOXYzine (ATARAX/VISTARIL) 25 MG tablet Take 1 tablet (25 mg total) by mouth every 6 (six) hours as needed for itching.  08/24/15   Sherwood Gambler, MD  ibuprofen (ADVIL,MOTRIN) 800 MG tablet Take 800 mg by mouth every 8 (eight) hours as needed for pain.    Historical Provider, MD  meclizine (ANTIVERT) 25 MG tablet Take 1 tablet (25 mg total) by mouth 3 (three) times daily as needed for dizziness. [id number]   Delora Fuel, MD  methocarbamol (ROBAXIN) 500 MG tablet Take 1 tablet (500 mg total) by mouth 2 (two) times daily as needed. 05/10/13   Noemi Chapel, MD  naproxen (NAPROSYN) 500 MG tablet Take 1 tablet (500 mg total) by mouth 2 (two) times daily. 12/25/12   Fredia Sorrow, MD  naproxen (NAPROSYN) 500 MG tablet Take 1 tablet (500 mg total) by mouth 2 (two) times daily with a meal. 05/10/13   Noemi Chapel, MD    Family History No family history on file.  Social History Social History  Substance Use Topics  . Smoking status: Current Some Day Smoker  . Smokeless tobacco: Never Used  . Alcohol use Yes     Comment: Occ     Allergies   Percocet [oxycodone-acetaminophen]   Review of Systems Review of Systems  Gastrointestinal: Positive for nausea.  Neurological: Positive for headaches.  All other systems reviewed and are negative.    Physical Exam Updated Vital Signs BP 116/81 (BP Location: Left  Arm)   Pulse 84   Temp 98.6 F (37 C) (Oral)   Resp 20   Ht 5' (1.524 m)   Wt 69.9 kg   LMP 05/15/2016   SpO2 100%   BMI 30.08 kg/m   Physical Exam  Constitutional: She appears well-developed and well-nourished.  HENT:  Head: Normocephalic and atraumatic.  Eyes: EOM are normal. Pupils are equal, round, and reactive to light.  Neck: Normal range of motion. Neck supple.  Cardiovascular: Normal rate.   Pulmonary/Chest: Effort normal.  Abdominal: Soft.  Musculoskeletal: Normal range of motion.  Neurological: She is alert.  Skin: Skin is warm.  Psychiatric: She has a normal mood and affect.  Nursing note and vitals reviewed.    ED Treatments / Results  Labs (all labs ordered are listed, but  only abnormal results are displayed) Labs Reviewed - No data to display  EKG  EKG Interpretation None       Radiology No results found.  Procedures Procedures (including critical care time)  Medications Ordered in ED Medications  diphenhydrAMINE (BENADRYL) injection 25 mg (25 mg Intramuscular Given 05/22/16 1126)  ketorolac (TORADOL) injection 60 mg (60 mg Intramuscular Given 05/22/16 1126)  metoCLOPramide (REGLAN) injection 10 mg (10 mg Intramuscular Given 05/22/16 1126)     Initial Impression / Assessment and Plan / ED Course  I have reviewed the triage vital signs and the nursing notes.  Pertinent labs & imaging results that were available during my care of the patient were reviewed by me and considered in my medical decision making (see chart for details).  Clinical Course     Pt given torodol, benadryl and reglan.  Pt reports she feels better.  Final Clinical Impressions(s) / ED Diagnoses   Final diagnoses:  Tension-type headache, not intractable, unspecified chronicity pattern    New Prescriptions New Prescriptions   No medications on file  Pt advised to follow up with her MD for recheck   Fransico Meadow, PA-C 05/22/16 [place], MD 05/22/16 1520

## 2016-05-22 NOTE — ED Notes (Signed)
Pt ambulatory to the waiting room. Pt verbalized understanding of discharge instructions.

## 2016-05-22 NOTE — ED Triage Notes (Signed)
Pt reports pain in neck behind r ear for past few days.  Reports had same problem last year and was told she had occipital neuralgia.

## 2016-05-23 ENCOUNTER — Encounter (HOSPITAL_COMMUNITY): Payer: Self-pay | Admitting: *Deleted

## 2016-05-23 ENCOUNTER — Emergency Department (HOSPITAL_COMMUNITY)
Admission: EM | Admit: 2016-05-23 | Discharge: 2016-05-23 | Disposition: A | Discharge status: Home / Self Care | Payer: Self-pay | Attending: Emergency Medicine | Admitting: Emergency Medicine

## 2016-05-23 DIAGNOSIS — F17200 Nicotine dependence, unspecified, uncomplicated: Secondary | ICD-10-CM | POA: Insufficient documentation

## 2016-05-23 DIAGNOSIS — M5481 Occipital neuralgia: Secondary | ICD-10-CM

## 2016-05-23 DIAGNOSIS — Z79899 Other long term (current) drug therapy: Secondary | ICD-10-CM | POA: Insufficient documentation

## 2016-05-23 MED ORDER — BUPIVACAINE HCL (PF) 0.5 % IJ SOLN
10.0000 mL | Freq: Once | INTRAMUSCULAR | Status: AC
  Administered 2016-05-23: 10 mL
  Filled 2016-05-23: qty 30

## 2016-05-23 NOTE — ED Notes (Signed)
Patient in room administering Bupivacaine to left occipital.

## 2016-05-23 NOTE — ED Provider Notes (Signed)
[facility] DEPT Provider Note   CSN: [id number] Arrival date & time: 05/23/16  1242   By signing my name below, I, Delton Prairie, attest that this documentation has been prepared under the direction and in the presence of Merrily Pew, MD  Electronically Signed: Delton Prairie, ED Scribe. 05/23/16. 1:30 PM.   History   Chief Complaint Chief Complaint  Patient presents with  . Headache   The history is provided by the patient. No language interpreter was used.   HPI Comments:  Destiny Beck is a 45 y.o. female, hx of headaches, migraines and occipital neuralgia, who presents to the Emergency Department complaining of sudden onset, intermittent, sharp right sided head pain which began 3 days ago. She notes her symptoms worsened today. Pt notes she was diagnosed with Occipital neuralgia about 1.5 years ago and notes today's symptoms are similar to the symptoms she experienced at that time. She also notes a sharp pain behind right ear. Pt visited the ED yesterday and was given Toradol, benadryl and reglan shots with temporary relief. She has also tried Excedrin with no relief. She takes Wellbutrin and lexapro. Pt denies any other symptoms or complaints at this time.  Past Medical History:  Diagnosis Date  . Abscess   . Anxiety     There are no active problems to display for this patient.   Past Surgical History:  Procedure Laterality Date  . CESAREAN SECTION      OB History    No data available       Home Medications    Prior to Admission medications   Medication Sig Start Date End Date Taking? Authorizing Provider  buPROPion (WELLBUTRIN) 100 MG tablet Take 100-150 mg by mouth 2 (two) times daily. 150 in the morning and 100 in the evening   Yes Historical Provider, MD  escitalopram (LEXAPRO) 20 MG tablet Take 20 mg by mouth daily.   Yes Historical Provider, MD  gabapentin (NEURONTIN) 100 MG capsule Take 100 mg by mouth 2 (two) times daily.   Yes Historical Provider, MD   ibuprofen (ADVIL,MOTRIN) 800 MG tablet Take 800 mg by mouth every 8 (eight) hours as needed for pain.   Yes Historical Provider, MD  LORazepam (ATIVAN) 0.5 MG tablet Take 0.5 mg by mouth 2 (two) times daily.   Yes Historical Provider, MD    Family History No family history on file.  Social History Social History  Substance Use Topics  . Smoking status: Current Some Day Smoker  . Smokeless tobacco: Never Used     Comment: occasional smoker   . Alcohol use Yes     Comment: Occ     Allergies   Percocet [oxycodone-acetaminophen]   Review of Systems Review of Systems  HENT: Positive for ear pain.   Skin: Negative for color change.  Neurological: Positive for headaches.  All other systems reviewed and are negative.    Physical Exam Updated Vital Signs BP 111/77 (BP Location: Left Arm)   Pulse 74   Temp 98.2 F (36.8 C) (Oral)   Resp 18   Ht 5' (1.524 m)   Wt 150 lb (68 kg)   LMP 05/15/2016   SpO2 99%   BMI 29.29 kg/m   Physical Exam  Constitutional: She is oriented to person, place, and time. She appears well-developed and well-nourished. No distress.  HENT:  Head: Normocephalic and atraumatic.  Eyes: Conjunctivae are normal.  Neck:  Pain behind ear. No redness, swelling or warmth  Cardiovascular: Normal rate.  Pulmonary/Chest: Effort normal.  Abdominal: She exhibits no distension.  Musculoskeletal: She exhibits no edema.  Neurological: She is alert and oriented to person, place, and time.  Cranial nerves intact. Upper and lower motor is grossly intact   Skin: Skin is warm and dry. No erythema.  Psychiatric: She has a normal mood and affect.  Nursing note and vitals reviewed.    ED Treatments / Results  DIAGNOSTIC STUDIES:  Oxygen Saturation is 100% on RA, normal by my interpretation.    COORDINATION OF CARE:  1:27 PM Discussed treatment plan with pt at bedside and pt agreed to plan.  Labs (all labs ordered are listed, but only abnormal results  are displayed) Labs Reviewed - No data to display  EKG  EKG Interpretation None       Radiology No results found.  Procedures .Nerve Block Date/Time: 05/23/2016 6:23 PM Performed by: Merrily Pew Authorized by: Merrily Pew   Consent:    Consent obtained:  Verbal   Consent given by:  Patient   Risks discussed:  Infection Indications:    Indications:  Pain relief Location:    Body area:  Head   Head nerve:  Lesser occipital   Laterality:  Right Pre-procedure details:    Skin preparation:  Alcohol   Preparation: Patient was prepped and draped in usual sterile fashion   Skin anesthesia (see MAR for exact dosages):    Skin anesthesia method:  None Procedure details (see MAR for exact dosages):    Block needle gauge:  25 G   Guidance comment:  Anatomy   Anesthetic injected:  Bupivacaine 0.5% w/o epi   Steroid injected:  None   Additive injected:  None   Injection procedure:  Anatomic landmarks identified, negative aspiration for blood and anatomic landmarks palpated   Paresthesia:  None Post-procedure details:    Dressing:  None   Outcome:  Pain improved   Patient tolerance of procedure:  Tolerated well, no immediate complications   (including critical care time)  Medications Ordered in ED Medications  bupivacaine (MARCAINE) 0.5 % injection 10 mL (10 mLs Infiltration Given by Other 05/23/16 1335)     Initial Impression / Assessment and Plan / ED Course  I have reviewed the triage vital signs and the nursing notes.  Pertinent labs & imaging results that were available during my care of the patient were reviewed by me and considered in my medical decision making (see chart for details).  Clinical Course     Headache, likely occipital neuralgia, improved from 10 (crying) to 1 (smiling) after nerve block. Doubt any other emergent causes for headache at this time. No e/o infection in that area. Doubt vertebral artery dissection.   Final Clinical Impressions(s)  / ED Diagnoses   Final diagnoses:  Occipital neuralgia of right side    New Prescriptions Discharge Medication List as of 05/23/2016  2:40 PM      I personally performed the services described in this documentation, which was scribed in my presence. The recorded information has been reviewed and is accurate.     Merrily Pew, MD 05/23/16 [phone]

## 2016-05-23 NOTE — ED Triage Notes (Signed)
Pt reports sharp pains in the right side of her head. Pt reports she was here at [facility] yesterday with the same pain and given 3 shots for relief of pain and after she went home and the medication wore off she started having pain again. Pt has hx of occipital neuralgia. Pt reports lightheadedness, has hx of vertigo. Denies n/v. Slight light and sound sensitivity.

## 2016-07-07 ENCOUNTER — Emergency Department (HOSPITAL_COMMUNITY)
Admission: EM | Admit: 2016-07-07 | Discharge: 2016-07-07 | Disposition: A | Discharge status: Home / Self Care | Payer: Self-pay | Attending: Emergency Medicine | Admitting: Emergency Medicine

## 2016-07-07 ENCOUNTER — Encounter (HOSPITAL_COMMUNITY): Payer: Self-pay | Admitting: Emergency Medicine

## 2016-07-07 DIAGNOSIS — B9789 Other viral agents as the cause of diseases classified elsewhere: Secondary | ICD-10-CM

## 2016-07-07 DIAGNOSIS — M545 Low back pain, unspecified: Secondary | ICD-10-CM

## 2016-07-07 DIAGNOSIS — F17210 Nicotine dependence, cigarettes, uncomplicated: Secondary | ICD-10-CM | POA: Insufficient documentation

## 2016-07-07 DIAGNOSIS — G8929 Other chronic pain: Secondary | ICD-10-CM | POA: Insufficient documentation

## 2016-07-07 DIAGNOSIS — J069 Acute upper respiratory infection, unspecified: Secondary | ICD-10-CM | POA: Insufficient documentation

## 2016-07-07 MED ORDER — HYDROCODONE-HOMATROPINE 5-1.5 MG/5ML PO SYRP: 5.0000 mL | ORAL_SOLUTION | Freq: Four times a day (QID) | ORAL | 0 refills | Status: DC | PRN

## 2016-07-07 MED ORDER — LORATADINE-PSEUDOEPHEDRINE ER 5-120 MG PO TB12: 1.0000 | ORAL_TABLET | Freq: Two times a day (BID) | ORAL | 0 refills | Status: DC

## 2016-07-07 MED ORDER — HYDROCOD POLST-CPM POLST ER 10-8 MG/5ML PO SUER
5.0000 mL | Freq: Once | ORAL | Status: AC
  Administered 2016-07-07: 5 mL via ORAL
  Filled 2016-07-07: qty 5

## 2016-07-07 MED ORDER — CYCLOBENZAPRINE HCL 10 MG PO TABS
10.0000 mg | ORAL_TABLET | Freq: Once | ORAL | Status: AC
  Administered 2016-07-07: 10 mg via ORAL
  Filled 2016-07-07: qty 1

## 2016-07-07 MED ORDER — CYCLOBENZAPRINE HCL 10 MG PO TABS: 10.0000 mg | ORAL_TABLET | Freq: Three times a day (TID) | ORAL | 0 refills | Status: DC

## 2016-07-07 NOTE — ED Triage Notes (Signed)
Patient complains of cough and congestion that started last night. Denies fever N/V/D.

## 2016-07-07 NOTE — Discharge Instructions (Signed)
Please increase fluids. Please wash hands frequently. Heating pad to your lower back maybe helpful. Use Claritin-D every 12 hours for congestion. Use Hycodan for cough. Use Flexeril for spasm pain of your lower back. Hycodan and Flexeril may cause drowsiness, please use these medications with caution. Please do not drink alcohol, drive a vehicle, free machinery, or participate in activities requiring concentration when taking this medication. Please see Dr. Edrick Oh for additional evaluation if not improving.

## 2016-07-07 NOTE — ED Provider Notes (Signed)
[facility] DEPT Provider Note   CSN: [id number] Arrival date & time: 07/07/16  0741     History   Chief Complaint Chief Complaint  Patient presents with  . Cough  . Back Pain    HPI Shavanda Maliszewski is a 45 y.o. female.  Pt exposed to child who has been sick.    URI   This is a new problem. The current episode started yesterday. The problem has been gradually worsening. Associated symptoms include congestion, cough and wheezing. Pertinent negatives include no chest pain, no abdominal pain, no diarrhea, no vomiting, no dysuria and no neck pain. She has tried rest (water and rest) for the symptoms.    Past Medical History:  Diagnosis Date  . Abscess   . Anxiety     There are no active problems to display for this patient.   Past Surgical History:  Procedure Laterality Date  . CESAREAN SECTION      OB History    No data available       Home Medications    Prior to Admission medications   Medication Sig Start Date End Date Taking? Authorizing Provider  buPROPion (WELLBUTRIN) 100 MG tablet Take 100-150 mg by mouth 2 (two) times daily. 150 in the morning and 100 in the evening    Historical Provider, MD  escitalopram (LEXAPRO) 20 MG tablet Take 20 mg by mouth daily.    Historical Provider, MD  gabapentin (NEURONTIN) 100 MG capsule Take 100 mg by mouth 2 (two) times daily.    Historical Provider, MD  ibuprofen (ADVIL,MOTRIN) 800 MG tablet Take 800 mg by mouth every 8 (eight) hours as needed for pain.    Historical Provider, MD  LORazepam (ATIVAN) 0.5 MG tablet Take 0.5 mg by mouth 2 (two) times daily.    Historical Provider, MD    Family History No family history on file.  Social History Social History  Substance Use Topics  . Smoking status: Current Some Day Smoker    Packs/day: 0.50    Types: Cigarettes  . Smokeless tobacco: Never Used     Comment: occasional smoker   . Alcohol use Yes     Comment: Occ     Allergies   Percocet  [oxycodone-acetaminophen]   Review of Systems Review of Systems  Constitutional: Negative for activity change.       All ROS Neg except as noted in HPI  HENT: Positive for congestion. Negative for nosebleeds.   Eyes: Negative for photophobia and discharge.  Respiratory: Positive for cough and wheezing. Negative for shortness of breath.   Cardiovascular: Negative for chest pain and palpitations.  Gastrointestinal: Negative for abdominal pain, blood in stool, diarrhea and vomiting.  Genitourinary: Negative for dysuria, frequency and hematuria.  Musculoskeletal: Positive for myalgias. Negative for arthralgias, back pain and neck pain.  Skin: Negative.   Neurological: Negative for dizziness, seizures and speech difficulty.  Psychiatric/Behavioral: Negative for confusion and hallucinations.     Physical Exam Updated Vital Signs BP 129/91 (BP Location: Left Arm)   Pulse 82   Temp 98.2 F (36.8 C) (Oral)   Resp 18   Ht 5' (1.524 m)   Wt 71.2 kg   LMP 06/09/2016   SpO2 100%   BMI 30.66 kg/m   Physical Exam  Constitutional: She is oriented to person, place, and time. She appears well-developed and well-nourished.  Non-toxic appearance.  HENT:  Head: Normocephalic.  Right Ear: Tympanic membrane and external ear normal.  Left Ear: Tympanic membrane and external  ear normal.  Nasal congestion present.  Eyes: EOM and lids are normal. Pupils are equal, round, and reactive to light.  Neck: Normal range of motion. Neck supple. Carotid bruit is not present.  Cardiovascular: Normal rate, regular rhythm, normal heart sounds, intact distal pulses and normal pulses.   Pulmonary/Chest: Breath sounds normal. No respiratory distress.  Diffuse soreness of the right rib area. Pain with deep breath and cough.  Abdominal: Soft. Bowel sounds are normal. There is no tenderness. There is no guarding.  Musculoskeletal: Normal range of motion.  Lower back pain with change of position and with cough. No  palpable step off. Some pain over the para spinal muscle area of the lumbar region.  Lymphadenopathy:       Head (right side): No submandibular adenopathy present.       Head (left side): No submandibular adenopathy present.    She has no cervical adenopathy.  Neurological: She is alert and oriented to person, place, and time. She has normal strength. No cranial nerve deficit or sensory deficit.  Skin: Skin is warm and dry.  Psychiatric: She has a normal mood and affect. Her speech is normal.  Nursing note and vitals reviewed.    ED Treatments / Results  Labs (all labs ordered are listed, but only abnormal results are displayed) Labs Reviewed - No data to display  EKG  EKG Interpretation None       Radiology No results found.  Procedures Procedures (including critical care time)  Medications Ordered in ED Medications - No data to display   Initial Impression / Assessment and Plan / ED Course  I have reviewed the triage vital signs and the nursing notes.  Pertinent labs & imaging results that were available during my care of the patient were reviewed by me and considered in my medical decision making (see chart for details).  Clinical Course     *I have reviewed nursing notes, vital signs, and all appropriate lab and imaging results for this patient.**  Final Clinical Impressions(s) / ED Diagnoses  Vital signs reviewed. Pulse oximetry is 100% on room air. The examination favors upper respiratory infection and chronic low back pain. a no evidence of cauda equina at this time. Patient will be treated with Flexeril, Hycodan, and decongestant education. Patient is to follow with the primary physician as soon as possible. Patient is in agreement with this plan.    Final diagnoses:  Viral URI with cough  Chronic low back pain without sciatica, unspecified back pain laterality    New Prescriptions Discharge Medication List as of 07/07/2016  9:35 AM    START taking  these medications   Details  cyclobenzaprine (FLEXERIL) 10 MG tablet Take 1 tablet (10 mg total) by mouth 3 (three) times daily., Starting Wed 07/07/2016, Print    HYDROcodone-homatropine (HYCODAN) 5-1.5 MG/5ML syrup Take 5 mLs by mouth every 6 (six) hours as needed., Starting Wed 07/07/2016, Print    loratadine-pseudoephedrine (CLARITIN-D 12 HOUR) 5-120 MG tablet Take 1 tablet by mouth 2 (two) times daily., Starting Wed 07/07/2016, Print         Lily Kocher, PA-C 07/08/16 1516    Virgel Manifold, MD 07/09/16 1150

## 2016-08-04 ENCOUNTER — Emergency Department (HOSPITAL_COMMUNITY)
Admission: EM | Admit: 2016-08-04 | Discharge: 2016-08-04 | Disposition: A | Discharge status: Home / Self Care | Payer: Self-pay | Attending: Emergency Medicine | Admitting: Emergency Medicine

## 2016-08-04 ENCOUNTER — Encounter (HOSPITAL_COMMUNITY): Payer: Self-pay | Admitting: Emergency Medicine

## 2016-08-04 DIAGNOSIS — L03312 Cellulitis of back [any part except buttock]: Secondary | ICD-10-CM | POA: Insufficient documentation

## 2016-08-04 DIAGNOSIS — Z791 Long term (current) use of non-steroidal anti-inflammatories (NSAID): Secondary | ICD-10-CM | POA: Insufficient documentation

## 2016-08-04 DIAGNOSIS — L02212 Cutaneous abscess of back [any part, except buttock]: Secondary | ICD-10-CM | POA: Insufficient documentation

## 2016-08-04 DIAGNOSIS — Z79899 Other long term (current) drug therapy: Secondary | ICD-10-CM | POA: Insufficient documentation

## 2016-08-04 DIAGNOSIS — L0291 Cutaneous abscess, unspecified: Secondary | ICD-10-CM

## 2016-08-04 DIAGNOSIS — F17210 Nicotine dependence, cigarettes, uncomplicated: Secondary | ICD-10-CM | POA: Insufficient documentation

## 2016-08-04 MED ORDER — CHLORHEXIDINE GLUCONATE 2 % EX PADS: 2.0000 | MEDICATED_PAD | Freq: Every day | CUTANEOUS | 0 refills | Status: DC

## 2016-08-04 MED ORDER — CEPHALEXIN 500 MG PO CAPS: ORAL_CAPSULE | ORAL | 0 refills | Status: DC

## 2016-08-04 MED ORDER — MUPIROCIN CALCIUM 2 % NA OINT: TOPICAL_OINTMENT | NASAL | 0 refills | Status: DC

## 2016-08-04 MED ORDER — CEPHALEXIN 500 MG PO CAPS
1000.0000 mg | ORAL_CAPSULE | Freq: Once | ORAL | Status: AC
  Administered 2016-08-04: 1000 mg via ORAL
  Filled 2016-08-04: qty 2

## 2016-08-04 MED ORDER — CHLORHEXIDINE GLUCONATE 0.12% ORAL RINSE (MEDLINE KIT): 15.0000 mL | Freq: Two times a day (BID) | OROMUCOSAL | 0 refills | Status: DC

## 2016-08-04 MED ORDER — SULFAMETHOXAZOLE-TRIMETHOPRIM 800-160 MG PO TABS: 1.0000 | ORAL_TABLET | Freq: Two times a day (BID) | ORAL | 0 refills | Status: AC

## 2016-08-04 MED ORDER — SULFAMETHOXAZOLE-TRIMETHOPRIM 800-160 MG PO TABS
1.0000 | ORAL_TABLET | Freq: Once | ORAL | Status: AC
  Administered 2016-08-04: 1 via ORAL
  Filled 2016-08-04: qty 1

## 2016-08-04 NOTE — ED Provider Notes (Signed)
[facility] DEPT Provider Note   CSN: [id number] Arrival date & time: 08/04/16  1608     History   Chief Complaint Chief Complaint  Patient presents with  . Abscess    lower back    HPI Destiny Beck is a 46 y.o. female.   Abscess  Location:  Torso Torso abscess location:  Lower back Abscess quality: draining (4 cm), induration, painful, redness, warmth and weeping   Red streaking: no   Duration:  3 days Progression:  Partially resolved Pain details:    Quality:  Sharp and pressure   Severity:  Moderate Chronicity:  Recurrent Relieved by:  Warm compresses and draining/squeezing   Past Medical History:  Diagnosis Date  . Abscess   . Anxiety     There are no active problems to display for this patient.   Past Surgical History:  Procedure Laterality Date  . CESAREAN SECTION      OB History    Gravida Para Term Preterm AB Living   3         3   SAB TAB Ectopic Multiple Live Births                   Home Medications    Prior to Admission medications   Medication Sig Start Date End Date Taking? Authorizing Provider  buPROPion (WELLBUTRIN) 100 MG tablet Take 100-150 mg by mouth 2 (two) times daily. 150 in the morning and 100 in the evening    Historical Provider, MD  cephALEXin (KEFLEX) 500 MG capsule 2 caps po bid x 10 days 08/04/16   Merrily Pew, MD  Chlorhexidine Gluconate 2 % PADS Apply 2 each topically daily. 08/04/16   Merrily Pew, MD  chlorhexidine gluconate, MEDLINE KIT, (PERIDEX) 0.12 % solution Use as directed 15 mLs in the mouth or throat 2 (two) times daily. 08/04/16   Merrily Pew, MD  cyclobenzaprine (FLEXERIL) 10 MG tablet Take 1 tablet (10 mg total) by mouth 3 (three) times daily. 07/07/16   Lily Kocher, PA-C  escitalopram (LEXAPRO) 20 MG tablet Take 20 mg by mouth daily.    Historical Provider, MD  gabapentin (NEURONTIN) 100 MG capsule Take 100 mg by mouth 2 (two) times daily.    Historical Provider, MD  HYDROcodone-homatropine  (HYCODAN) 5-1.5 MG/5ML syrup Take 5 mLs by mouth every 6 (six) hours as needed. 07/07/16   Lily Kocher, PA-C  ibuprofen (ADVIL,MOTRIN) 800 MG tablet Take 800 mg by mouth every 8 (eight) hours as needed for pain.    Historical Provider, MD  loratadine-pseudoephedrine (CLARITIN-D 12 HOUR) 5-120 MG tablet Take 1 tablet by mouth 2 (two) times daily. 07/07/16   Lily Kocher, PA-C  LORazepam (ATIVAN) 0.5 MG tablet Take 0.5 mg by mouth 2 (two) times daily.    Historical Provider, MD  mupirocin nasal ointment (BACTROBAN) 2 % Apply in each nostril daily 08/04/16   Merrily Pew, MD  sulfamethoxazole-trimethoprim (BACTRIM DS,SEPTRA DS) 800-160 MG tablet Take 1 tablet by mouth 2 (two) times daily. 08/04/16 08/11/16  Merrily Pew, MD    Family History History reviewed. No pertinent family history.  Social History Social History  Substance Use Topics  . Smoking status: Current Some Day Smoker    Packs/day: 0.50    Types: Cigarettes  . Smokeless tobacco: Never Used     Comment: occasional smoker   . Alcohol use Yes     Comment: Occ     Allergies   Percocet [oxycodone-acetaminophen]   Review of Systems Review of  Systems  All other systems reviewed and are negative.    Physical Exam Updated Vital Signs BP 132/77 (BP Location: Left Arm)   Pulse 97   Temp 97.8 F (36.6 C) (Oral)   Resp 16   Ht 5' (1.524 m)   Wt 156 lb (70.8 kg)   LMP 07/14/2016 (Exact Date)   SpO2 98%   BMI 30.47 kg/m   Physical Exam  Constitutional: She is oriented to person, place, and time. She appears well-developed and well-nourished.  HENT:  Head: Normocephalic and atraumatic.  Eyes: Conjunctivae and EOM are normal.  Neck: Normal range of motion.  Cardiovascular: Normal rate and regular rhythm.   Pulmonary/Chest: Effort normal. No stridor. No respiratory distress.  Abdominal: Soft. She exhibits no distension. There is no tenderness.  Musculoskeletal: She exhibits no edema or deformity.  Neurological: She  is alert and oriented to person, place, and time.  Skin: Skin is warm and dry. Rash (right side of lower back with 4 cm swollen indurated, nonfluctuant, warm area with scab) noted.  Nursing note and vitals reviewed.    ED Treatments / Results  Labs (all labs ordered are listed, but only abnormal results are displayed) Labs Reviewed - No data to display  EKG  EKG Interpretation None       Radiology No results found.  Procedures Procedures (including critical care time)  EMERGENCY DEPARTMENT US SOFT TISSUE INTERPRETATION "Study: Limited Soft Tissue Ultrasound"  INDICATIONS: Pain Multiple views of the body part were obtained in real-time with a multi-frequency linear probe PERFORMED BY:  Myself IMAGES ARCHIVED?: Yes SIDE:Right  BODY PART:Lower back FINDINGS: Cellulitis present INTERPRETATION:  Cellulitis present   CPT: Lower back 10175-10   Medications Ordered in ED Medications  sulfamethoxazole-trimethoprim (BACTRIM DS,SEPTRA DS) 800-160 MG per tablet 1 tablet (not administered)  cephALEXin (KEFLEX) capsule 1,000 mg (not administered)     Initial Impression / Assessment and Plan / ED Course  I have reviewed the triage vital signs and the nursing notes.  Pertinent labs & imaging results that were available during my care of the patient were reviewed by me and considered in my medical decision making (see chart for details).    Abscess and cellulitis. Abscess already drained. Has had multiple times in the past year. Will give instructions for chlorhexidine wash/gargle and bactroban nasally. Will also treat current cellulitis/abscess with bactrim/keflex.    Final Clinical Impressions(s) / ED Diagnoses   Final diagnoses:  Abscess  Cellulitis of back except buttock    New Prescriptions New Prescriptions   CEPHALEXIN (KEFLEX) 500 MG CAPSULE    2 caps po bid x 10 days   CHLORHEXIDINE GLUCONATE 2 % PADS    Apply 2 each topically daily.   CHLORHEXIDINE  GLUCONATE, MEDLINE KIT, (PERIDEX) 0.12 % SOLUTION    Use as directed 15 mLs in the mouth or throat 2 (two) times daily.   MUPIROCIN NASAL OINTMENT (BACTROBAN) 2 %    Apply in each nostril daily   SULFAMETHOXAZOLE-TRIMETHOPRIM (BACTRIM DS,SEPTRA DS) 800-160 MG TABLET    Take 1 tablet by mouth 2 (two) times daily.     Merrily Pew, MD 08/04/16 [phone]

## 2016-08-04 NOTE — ED Triage Notes (Signed)
Abscess to lower back.  History of cyst.  Pain 5/10 with movement.  Area drained yesterday, thick reddish drainage noted.

## 2016-10-29 ENCOUNTER — Emergency Department (HOSPITAL_COMMUNITY)
Admission: EM | Admit: 2016-10-29 | Discharge: 2016-10-29 | Disposition: A | Discharge status: Home / Self Care | Payer: Self-pay | Attending: Emergency Medicine | Admitting: Emergency Medicine

## 2016-10-29 ENCOUNTER — Encounter (HOSPITAL_COMMUNITY): Payer: Self-pay | Admitting: *Deleted

## 2016-10-29 ENCOUNTER — Emergency Department (HOSPITAL_COMMUNITY): Payer: Self-pay

## 2016-10-29 DIAGNOSIS — Y929 Unspecified place or not applicable: Secondary | ICD-10-CM | POA: Insufficient documentation

## 2016-10-29 DIAGNOSIS — G8929 Other chronic pain: Secondary | ICD-10-CM

## 2016-10-29 DIAGNOSIS — S161XXA Strain of muscle, fascia and tendon at neck level, initial encounter: Secondary | ICD-10-CM | POA: Insufficient documentation

## 2016-10-29 DIAGNOSIS — Z79899 Other long term (current) drug therapy: Secondary | ICD-10-CM | POA: Insufficient documentation

## 2016-10-29 DIAGNOSIS — X58XXXA Exposure to other specified factors, initial encounter: Secondary | ICD-10-CM | POA: Insufficient documentation

## 2016-10-29 DIAGNOSIS — Y999 Unspecified external cause status: Secondary | ICD-10-CM | POA: Insufficient documentation

## 2016-10-29 DIAGNOSIS — F17210 Nicotine dependence, cigarettes, uncomplicated: Secondary | ICD-10-CM | POA: Insufficient documentation

## 2016-10-29 DIAGNOSIS — Y939 Activity, unspecified: Secondary | ICD-10-CM | POA: Insufficient documentation

## 2016-10-29 DIAGNOSIS — M5441 Lumbago with sciatica, right side: Secondary | ICD-10-CM | POA: Insufficient documentation

## 2016-10-29 MED ORDER — CYCLOBENZAPRINE HCL 10 MG PO TABS: 10.0000 mg | ORAL_TABLET | Freq: Three times a day (TID) | ORAL | 0 refills | Status: DC | PRN

## 2016-10-29 MED ORDER — TRAMADOL HCL 50 MG PO TABS: 50.0000 mg | ORAL_TABLET | Freq: Four times a day (QID) | ORAL | 0 refills | Status: DC | PRN

## 2016-10-29 MED ORDER — PREDNISONE 10 MG PO TABS: ORAL_TABLET | ORAL | 0 refills | Status: DC

## 2016-10-29 NOTE — Discharge Instructions (Signed)
Alternate ice and heat to your back and neck.  Follow-up with your primary doctor for recheck if not improving

## 2016-10-29 NOTE — ED Triage Notes (Signed)
Pt c/o head and neck pain for the last few days; pt denies any obvious injury

## 2016-10-29 NOTE — ED Provider Notes (Signed)
[facility] DEPT Provider Note   CSN: [id number] Arrival date & time: 10/29/16  0707     History   Chief Complaint Chief Complaint  Patient presents with  . Neck Pain    HPI Destiny Beck is a 46 y.o. female.  HPI  Destiny Beck is a 46 y.o. female with hx of neck and lower back pain for "years" who presents to the Emergency Department complaining of worsening pain.  She reports increased activity recently, but denies known injury.  She usually takes tylenol and ibuprofen with some relief, but not helping for several days.  She reports having "spasms" to lower back after exertion.  She describes intermittent pain to her right thigh and "tingling" of both hands.  Pain to her neck is worse with arm movement.  She denies headaches, abd pain, urine or bowel changes, numbness or weakness of the extremities, fever, or chills.     Past Medical History:  Diagnosis Date  . Abscess   . Anxiety     There are no active problems to display for this patient.   Past Surgical History:  Procedure Laterality Date  . CESAREAN SECTION      OB History    Gravida Para Term Preterm AB Living   3         3   SAB TAB Ectopic Multiple Live Births                   Home Medications    Prior to Admission medications   Medication Sig Start Date End Date Taking? Authorizing Provider  buPROPion (WELLBUTRIN) 100 MG tablet Take 100-150 mg by mouth 2 (two) times daily. 150 in the morning and 100 in the evening    Historical Provider, MD  cephALEXin (KEFLEX) 500 MG capsule 2 caps po bid x 10 days 08/04/16   Merrily Pew, MD  Chlorhexidine Gluconate 2 % PADS Apply 2 each topically daily. 08/04/16   Merrily Pew, MD  chlorhexidine gluconate, MEDLINE KIT, (PERIDEX) 0.12 % solution Use as directed 15 mLs in the mouth or throat 2 (two) times daily. 08/04/16   Merrily Pew, MD  cyclobenzaprine (FLEXERIL) 10 MG tablet Take 1 tablet (10 mg total) by mouth 3 (three) times daily. 07/07/16   Lily Kocher, PA-C  escitalopram (LEXAPRO) 20 MG tablet Take 20 mg by mouth daily.    Historical Provider, MD  gabapentin (NEURONTIN) 100 MG capsule Take 100 mg by mouth 2 (two) times daily.    Historical Provider, MD  HYDROcodone-homatropine (HYCODAN) 5-1.5 MG/5ML syrup Take 5 mLs by mouth every 6 (six) hours as needed. 07/07/16   Lily Kocher, PA-C  ibuprofen (ADVIL,MOTRIN) 800 MG tablet Take 800 mg by mouth every 8 (eight) hours as needed for pain.    Historical Provider, MD  loratadine-pseudoephedrine (CLARITIN-D 12 HOUR) 5-120 MG tablet Take 1 tablet by mouth 2 (two) times daily. 07/07/16   Lily Kocher, PA-C  LORazepam (ATIVAN) 0.5 MG tablet Take 0.5 mg by mouth 2 (two) times daily.    Historical Provider, MD  mupirocin nasal ointment (BACTROBAN) 2 % Apply in each nostril daily 08/04/16   Merrily Pew, MD    Family History History reviewed. No pertinent family history.  Social History Social History  Substance Use Topics  . Smoking status: Current Some Day Smoker    Packs/day: 0.50    Types: Cigarettes  . Smokeless tobacco: Never Used     Comment: occasional smoker   . Alcohol use Yes  Comment: Occ     Allergies   Percocet [oxycodone-acetaminophen]   Review of Systems Review of Systems  Constitutional: Negative for fever.  Respiratory: Negative for shortness of breath.   Gastrointestinal: Negative for abdominal pain, constipation and vomiting.  Genitourinary: Negative for decreased urine volume, difficulty urinating, dysuria, flank pain and hematuria.  Musculoskeletal: Positive for back pain and neck pain. Negative for gait problem and joint swelling.  Skin: Negative for rash.  Neurological: Negative for dizziness, weakness, numbness ("tingling" both hands) and headaches.  All other systems reviewed and are negative.    Physical Exam Updated Vital Signs BP 124/74 (BP Location: Left Arm)   Pulse (!) 52   Temp 97.7 F (36.5 C) (Oral)   Resp 16   Ht 5' (1.524 m)    Wt 68 kg   LMP 10/22/2016   SpO2 100%   BMI 29.29 kg/m   Physical Exam  Constitutional: She is oriented to person, place, and time. She appears well-developed and well-nourished. No distress.  HENT:  Head: Normocephalic and atraumatic.  Neck: Normal range of motion. Neck supple.  Cardiovascular: Normal rate, regular rhythm, normal heart sounds and intact distal pulses.   No murmur heard. Pulmonary/Chest: Effort normal and breath sounds normal. No respiratory distress.  Abdominal: Soft. She exhibits no distension. There is no tenderness.  Musculoskeletal: She exhibits tenderness. She exhibits no edema.       Lumbar back: She exhibits tenderness and pain. She exhibits normal range of motion, no swelling, no deformity, no laceration and normal pulse.  ttp of the bilateral cervical and lumbar paraspinal muscles.  Tenderness of the right SI joint.  No spinal tenderness.  Pt has 5/5 strength against resistance of bilateral upper and lower extremities.     Neurological: She is alert and oriented to person, place, and time. She has normal strength. No sensory deficit. She exhibits normal muscle tone. Coordination and gait normal.  Reflex Scores:      Patellar reflexes are 2+ on the right side and 2+ on the left side.      Achilles reflexes are 2+ on the right side and 2+ on the left side. Skin: Skin is warm and dry. Capillary refill takes less than 2 seconds. No rash noted.  Nursing note and vitals reviewed.    ED Treatments / Results  Labs (all labs ordered are listed, but only abnormal results are displayed) Labs Reviewed - No data to display  EKG  EKG Interpretation None       Radiology Dg Cervical Spine Complete  Result Date: 10/29/2016 CLINICAL DATA:  Chronic neck and back pain. Burning and numb feeling at the base of the cervical spine for cervical days. EXAM: CERVICAL SPINE - COMPLETE 4+ VIEW COMPARISON:  None. FINDINGS: Cervical spine is visualized the skullbase through the  cervicothoracic junction. Loss of disc height and uncovertebral spurring is most evident CT C3-4 and C5-6 bilaterally. Disease at C5-6 is worse on the left with osseous foraminal narrowing bilaterally. No acute fracture traumatic subluxation is present. The prevertebral soft tissues are normal. The lung apices are clear. IMPRESSION: 1. 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 2. No acute abnormality. Electronically Signed   By: San Morelle M.D.   On: 10/29/2016 09:47   Dg Lumbar Spine Complete  Result Date: 10/29/2016 CLINICAL DATA:  Worsening chronic back pain.  No reported injury. EXAM: LUMBAR SPINE - COMPLETE 4+ VIEW COMPARISON:  12/25/2012 CT  abdomen/pelvis. FINDINGS: This report assumes 5 non rib-bearing lumbar vertebrae. Straightening of the lumbar spine. Lumbar vertebral body heights are preserved, with no fracture. Mild multilevel spondylosis throughout the lumbar spine with mild loss of disc height at L5-S1. No spondylolisthesis. No significant facet arthropathy. No aggressive appearing focal osseous lesions. 5 mm lower left renal stone. Cholelithiasis. IMPRESSION: 1. No lumbar spine fracture or spondylolisthesis. 2. Mild multilevel lumbar spondylosis. 3. Left nephrolithiasis. 4. Cholelithiasis. Electronically Signed   By: Ilona Sorrel M.D.   On: 10/29/2016 09:48     Procedures Procedures (including critical care time)  Medications Ordered in ED Medications - No data to display   Initial Impression / Assessment and Plan / ED Course  I have reviewed the triage vital signs and the nursing notes.  Pertinent labs & imaging results that were available during my care of the patient were reviewed by me and considered in my medical decision making (see chart for details).     Patient is well-appearing. Vital signs are stable. No focal neuro deficits on exam. No concerning symptoms for emergent neurological process.  No abdominal pain or dysuria.  Appears stable for d/c.  Return precautions discussed  Final Clinical Impressions(s) / ED Diagnoses   Final diagnoses:  Chronic right-sided low back pain with right-sided sciatica  Acute strain of neck muscle, initial encounter    New Prescriptions New Prescriptions   No medications on file     Bufford Lope 11/01/16 [place], MD 11/02/16 1228

## 2016-10-29 NOTE — ED Notes (Signed)
Pt to xray

## 2016-11-09 DIAGNOSIS — R5382 Chronic fatigue, unspecified: Secondary | ICD-10-CM | POA: Insufficient documentation

## 2017-02-11 ENCOUNTER — Emergency Department (HOSPITAL_COMMUNITY)
Admission: EM | Admit: 2017-02-11 | Discharge: 2017-02-11 | Disposition: A | Discharge status: Home / Self Care | Payer: Self-pay | Attending: Emergency Medicine | Admitting: Emergency Medicine

## 2017-02-11 ENCOUNTER — Emergency Department (HOSPITAL_COMMUNITY): Payer: Self-pay

## 2017-02-11 ENCOUNTER — Encounter (HOSPITAL_COMMUNITY): Payer: Self-pay | Admitting: Emergency Medicine

## 2017-02-11 DIAGNOSIS — R112 Nausea with vomiting, unspecified: Secondary | ICD-10-CM | POA: Insufficient documentation

## 2017-02-11 DIAGNOSIS — Z87891 Personal history of nicotine dependence: Secondary | ICD-10-CM | POA: Insufficient documentation

## 2017-02-11 DIAGNOSIS — N83201 Unspecified ovarian cyst, right side: Secondary | ICD-10-CM

## 2017-02-11 DIAGNOSIS — N83202 Unspecified ovarian cyst, left side: Secondary | ICD-10-CM

## 2017-02-11 DIAGNOSIS — N83292 Other ovarian cyst, left side: Secondary | ICD-10-CM | POA: Insufficient documentation

## 2017-02-11 DIAGNOSIS — R1084 Generalized abdominal pain: Secondary | ICD-10-CM | POA: Insufficient documentation

## 2017-02-11 DIAGNOSIS — K8020 Calculus of gallbladder without cholecystitis without obstruction: Secondary | ICD-10-CM | POA: Insufficient documentation

## 2017-02-11 DIAGNOSIS — R109 Unspecified abdominal pain: Secondary | ICD-10-CM

## 2017-02-11 DIAGNOSIS — N83291 Other ovarian cyst, right side: Secondary | ICD-10-CM | POA: Insufficient documentation

## 2017-02-11 DIAGNOSIS — R197 Diarrhea, unspecified: Secondary | ICD-10-CM

## 2017-02-11 DIAGNOSIS — Z79899 Other long term (current) drug therapy: Secondary | ICD-10-CM | POA: Insufficient documentation

## 2017-02-11 LAB — COMPREHENSIVE METABOLIC PANEL
ALBUMIN: 4.2 g/dL (ref 3.5–5.0)
ALT: 13 U/L — ABNORMAL LOW (ref 14–54)
ANION GAP: 7 (ref 5–15)
AST: 17 U/L (ref 15–41)
Alkaline Phosphatase: 69 U/L (ref 38–126)
BUN: 15 mg/dL (ref 6–20)
CHLORIDE: 104 mmol/L (ref 101–111)
CO2: 25 mmol/L (ref 22–32)
Calcium: 9.2 mg/dL (ref 8.9–10.3)
Creatinine, Ser: 0.91 mg/dL (ref 0.44–1.00)
GFR calc Af Amer: >60 mL/min (ref >60)
GFR calc non Af Amer: >60 mL/min (ref >60)
GLUCOSE: 96 mg/dL (ref 65–99)
POTASSIUM: 4.3 mmol/L (ref 3.5–5.1)
SODIUM: 136 mmol/L (ref 135–145)
Total Bilirubin: 0.4 mg/dL (ref 0.3–1.2)
Total Protein: 7.4 g/dL (ref 6.5–8.1)

## 2017-02-11 LAB — URINALYSIS, ROUTINE W REFLEX MICROSCOPIC
Bilirubin Urine: NEGATIVE
GLUCOSE, UA: NEGATIVE mg/dL
Hgb urine dipstick: NEGATIVE
Ketones, ur: NEGATIVE mg/dL
LEUKOCYTES UA: NEGATIVE
Nitrite: NEGATIVE
PROTEIN: NEGATIVE mg/dL
Specific Gravity, Urine: 1.023 (ref 1.005–1.030)
pH: 5 (ref 5.0–8.0)

## 2017-02-11 LAB — CBC WITH DIFFERENTIAL/PLATELET
BASOS PCT: 0 %
Basophils Absolute: 0 10*3/uL (ref 0.0–0.1)
Eosinophils Absolute: 0.3 10*3/uL (ref 0.0–0.7)
Eosinophils Relative: 4 %
HEMATOCRIT: 33.2 % — AB (ref 36.0–46.0)
HEMOGLOBIN: 10.3 g/dL — AB (ref 12.0–15.0)
LYMPHS ABS: 1.1 10*3/uL (ref 0.7–4.0)
LYMPHS PCT: 15 %
MCH: 24.3 pg — AB (ref 26.0–34.0)
MCHC: 31 g/dL (ref 30.0–36.0)
MCV: 78.3 fL (ref 78.0–100.0)
MONO ABS: 0.5 10*3/uL (ref 0.1–1.0)
MONOS PCT: 7 %
NEUTROS ABS: 5.7 10*3/uL (ref 1.7–7.7)
NEUTROS PCT: 74 %
Platelets: 250 10*3/uL (ref 150–400)
RBC: 4.24 MIL/uL (ref 3.87–5.11)
RDW: 16.2 % — ABNORMAL HIGH (ref 11.5–15.5)
WBC: 7.6 10*3/uL (ref 4.0–10.5)

## 2017-02-11 LAB — PREGNANCY, URINE: Preg Test, Ur: NEGATIVE

## 2017-02-11 LAB — LIPASE, BLOOD: Lipase: 36 U/L (ref 11–51)

## 2017-02-11 MED ORDER — PROMETHAZINE HCL 25 MG PO TABS: 25.0000 mg | ORAL_TABLET | Freq: Four times a day (QID) | ORAL | 1 refills | Status: DC | PRN

## 2017-02-11 MED ORDER — ONDANSETRON HCL 4 MG/2ML IJ SOLN
4.0000 mg | INTRAMUSCULAR | Status: DC | PRN
  Administered 2017-02-11: 4 mg via INTRAVENOUS
  Filled 2017-02-11: qty 2

## 2017-02-11 MED ORDER — IOPAMIDOL (ISOVUE-300) INJECTION 61%
100.0000 mL | Freq: Once | INTRAVENOUS | Status: AC | PRN
  Administered 2017-02-11: 100 mL via INTRAVENOUS

## 2017-02-11 MED ORDER — FAMOTIDINE IN NACL 20-0.9 MG/50ML-% IV SOLN
20.0000 mg | Freq: Once | INTRAVENOUS | Status: AC
  Administered 2017-02-11: 20 mg via INTRAVENOUS
  Filled 2017-02-11: qty 50

## 2017-02-11 NOTE — ED Provider Notes (Signed)
[facility] DEPT Provider Note   CSN: [id number] Arrival date & time: 02/11/17  1305     History   Chief Complaint Chief Complaint  Patient presents with  . Abdominal Pain    HPI Destiny Beck is a 46 y.o. female.  HPI  Pt was seen at 1335.  Per pt, c/o gradual onset and persistence of constant abd "pain" for the past 3 to 4 days. Has been associated with multiple intermittent episodes of N/V/D.  Describes the abd pain as "aching," and located more on her right side. Denies fevers, no back pain, no rash, no CP/SOB, no black or blood in stools or emesis. Denies sick contacts with similar symptoms.    Past Medical History:  Diagnosis Date  . Abscess   . Anxiety     There are no active problems to display for this patient.   Past Surgical History:  Procedure Laterality Date  . CESAREAN SECTION      OB History    Gravida Para Term Preterm AB Living   3         3   SAB TAB Ectopic Multiple Live Births                   Home Medications    Prior to Admission medications   Medication Sig Start Date End Date Taking? Authorizing Provider  buPROPion (WELLBUTRIN) 100 MG tablet Take 100-150 mg by mouth 2 (two) times daily. 150 in the morning and 100 in the evening    [provider]  Chlorhexidine Gluconate 2 % PADS Apply 2 each topically daily. Patient not taking: Reported on 10/29/2016 08/04/16   Mesner, Corene Cornea, MD  chlorhexidine gluconate, MEDLINE KIT, (PERIDEX) 0.12 % solution Use as directed 15 mLs in the mouth or throat 2 (two) times daily. Patient not taking: Reported on 10/29/2016 08/04/16   Mesner, Corene Cornea, MD  cyclobenzaprine (FLEXERIL) 10 MG tablet Take 1 tablet (10 mg total) by mouth 3 (three) times daily as needed. 10/29/16   Triplett, Tammy, PA-C  escitalopram (LEXAPRO) 20 MG tablet Take 10 mg by mouth 2 (two) times daily.     [provider]  gabapentin (NEURONTIN) 100 MG capsule Take 100 mg by mouth 2 (two) times daily.    [provider]  ibuprofen (ADVIL,MOTRIN) 200 MG tablet Take 800 mg by mouth every 6 (six) hours as needed.    [provider]  LORazepam (ATIVAN) 0.5 MG tablet Take 0.5 mg by mouth 2 (two) times daily.    [provider]  predniSONE (DELTASONE) 10 MG tablet Take 6 tablets day one, 5 tablets day two, 4 tablets day three, 3 tablets day four, 2 tablets day five, then 1 tablet day six 10/29/16   Triplett, Tammy, PA-C  traMADol (ULTRAM) 50 MG tablet Take 1 tablet (50 mg total) by mouth every 6 (six) hours as needed. 10/29/16   Kem Parkinson, PA-C    Family History History reviewed. No pertinent family history.  Social History Social History  Substance Use Topics  . Smoking status: Former Smoker    Packs/day: 0.50    Types: Cigarettes    Quit date: 01/07/2017  . Smokeless tobacco: Never Used     Comment: occasional smoker   . Alcohol use Yes     Comment: Occ     Allergies   Percocet [oxycodone-acetaminophen]   Review of Systems Review of Systems ROS: Statement: All systems negative except as marked or noted in the HPI; Constitutional: Negative  for fever and chills. ; ; Eyes: Negative for eye pain, redness and discharge. ; ; ENMT: Negative for ear pain, hoarseness, nasal congestion, sinus pressure and sore throat. ; ; Cardiovascular: Negative for chest pain, palpitations, diaphoresis, dyspnea and peripheral edema. ; ; Respiratory: Negative for cough, wheezing and stridor. ; ; Gastrointestinal: +N/V/D, abd pain. Negative for blood in stool, hematemesis, jaundice and rectal bleeding. . ; ; Genitourinary: Negative for dysuria, flank pain and hematuria. ; ; Musculoskeletal: Negative for back pain and neck pain. Negative for swelling and trauma.; ; Skin: Negative for pruritus, rash, abrasions, blisters, bruising and skin lesion.; ; Neuro: Negative for headache, lightheadedness and neck stiffness. Negative for weakness, altered level of consciousness, altered mental status,  extremity weakness, paresthesias, involuntary movement, seizure and syncope.       Physical Exam Updated Vital Signs BP 113/69 (BP Location: Right Arm)   Pulse 67   Temp 98.7 F (37.1 C) (Oral)   Resp 16   Ht 5' 5"  (1.651 m)   Wt 69.9 kg (154 lb)   LMP 01/28/2017   SpO2 100%   BMI 25.63 kg/m   Physical Exam 1340: Physical examination:  Nursing notes reviewed; Vital signs and O2 SAT reviewed;  Constitutional: Well developed, Well nourished, Well hydrated, Uncomfortable appearing.; Head:  Normocephalic, atraumatic; Eyes: EOMI, PERRL, No scleral icterus; ENMT: Mouth and pharynx normal, Mucous membranes moist; Neck: Supple, Full range of motion, No lymphadenopathy; Cardiovascular: Regular rate and rhythm, No gallop; Respiratory: Breath sounds clear & equal bilaterally, No wheezes.  Speaking full sentences with ease, Normal respiratory effort/excursion; Chest: Nontender, Movement normal; Abdomen: Soft, +diffuse tenderness to palp. No rebound or guarding. Nondistended, Normal bowel sounds; Genitourinary: No CVA tenderness; Extremities: Pulses normal, No tenderness, No edema, No calf edema or asymmetry.; Neuro: AA&Ox3, Major CN grossly intact.  Speech clear. No gross focal motor or sensory deficits in extremities.; Skin: Color normal, Warm, Dry.   ED Treatments / Results  Labs (all labs ordered are listed, but only abnormal results are displayed)   EKG  EKG Interpretation None       Radiology   Procedures Procedures (including critical care time)  Medications Ordered in ED Medications  ondansetron (ZOFRAN) injection 4 mg (not administered)  famotidine (PEPCID) IVPB 20 mg premix (not administered)     Initial Impression / Assessment and Plan / ED Course  I have reviewed the triage vital signs and the nursing notes.  Pertinent labs & imaging results that were available during my care of the patient were reviewed by me and considered in my medical decision making (see chart  for details).  MDM Reviewed: previous chart, nursing note and vitals Reviewed previous: labs Interpretation: labs   Results for orders placed or performed during the hospital encounter of 02/11/17  Urinalysis, Routine w reflex microscopic  Result Value Ref Range   Color, Urine YELLOW YELLOW   APPearance HAZY (A) CLEAR   Specific Gravity, Urine 1.023 1.005 - 1.030   pH 5.0 5.0 - 8.0   Glucose, UA NEGATIVE NEGATIVE mg/dL   Hgb urine dipstick NEGATIVE NEGATIVE   Bilirubin Urine NEGATIVE NEGATIVE   Ketones, ur NEGATIVE NEGATIVE mg/dL   Protein, ur NEGATIVE NEGATIVE mg/dL   Nitrite NEGATIVE NEGATIVE   Leukocytes, UA NEGATIVE NEGATIVE  Comprehensive metabolic panel  Result Value Ref Range   Sodium 136 135 - 145 mmol/L   Potassium 4.3 3.5 - 5.1 mmol/L   Chloride 104 101 - 111 mmol/L   CO2 25 22 - 32  mmol/L   Glucose, Bld 96 65 - 99 mg/dL   BUN 15 6 - 20 mg/dL   Creatinine, Ser 0.91 0.44 - 1.00 mg/dL   Calcium 9.2 8.9 - 10.3 mg/dL   Total Protein 7.4 6.5 - 8.1 g/dL   Albumin 4.2 3.5 - 5.0 g/dL   AST 17 15 - 41 U/L   ALT 13 (L) 14 - 54 U/L   Alkaline Phosphatase 69 38 - 126 U/L   Total Bilirubin 0.4 0.3 - 1.2 mg/dL   GFR calc non Af Amer >60 >60 mL/min   GFR calc Af Amer >60 >60 mL/min   Anion gap 7 5 - 15  Lipase, blood  Result Value Ref Range   Lipase 36 11 - 51 U/L  CBC with Differential  Result Value Ref Range   WBC 7.6 4.0 - 10.5 K/uL   RBC 4.24 3.87 - 5.11 MIL/uL   Hemoglobin 10.3 (L) 12.0 - 15.0 g/dL   HCT 33.2 (L) 36.0 - 46.0 %   MCV 78.3 78.0 - 100.0 fL   MCH 24.3 (L) 26.0 - 34.0 pg   MCHC 31.0 30.0 - 36.0 g/dL   RDW 16.2 (H) 11.5 - 15.5 %   Platelets 250 150 - 400 K/uL   Neutrophils Relative % 74 %   Neutro Abs 5.7 1.7 - 7.7 K/uL   Lymphocytes Relative 15 %   Lymphs Abs 1.1 0.7 - 4.0 K/uL   Monocytes Relative 7 %   Monocytes Absolute 0.5 0.1 - 1.0 K/uL   Eosinophils Relative 4 %   Eosinophils Absolute 0.3 0.0 - 0.7 K/uL   Basophils Relative 0 %    Basophils Absolute 0.0 0.0 - 0.1 K/uL  Pregnancy, urine  Result Value Ref Range   Preg Test, Ur NEGATIVE NEGATIVE    1555:  Workup reassuring. CT A/P pending. Dipso based on results. Sign out to Dr. Rogene Houston.    Final Clinical Impressions(s) / ED Diagnoses   Final diagnoses:  None    New Prescriptions New Prescriptions   No medications on file     Francine Graven, DO 02/11/17 1556

## 2017-02-11 NOTE — Discharge Instructions (Signed)
Close follow-up with your primary care provider is important. Return for development of any right upper abdominal pain the last 4 hours or longer. Also would recommend ultrasound follow-up to further evaluate the bilateral ovarian cyst to make sure that they're not consistent with tumor. Take the Phenergan as needed for the nausea and vomiting. Work note provided.

## 2017-02-11 NOTE — ED Triage Notes (Signed)
abd pain with N, D since last night PCP Dr Edrick Oh

## 2017-02-11 NOTE — ED Triage Notes (Signed)
Patient complaining of right sided abdominal pain and nausea x 3-4 days with diarrhea starting yesterday.

## 2017-03-12 ENCOUNTER — Encounter (HOSPITAL_COMMUNITY): Payer: Self-pay | Admitting: Emergency Medicine

## 2017-03-12 ENCOUNTER — Emergency Department (HOSPITAL_COMMUNITY): Payer: Self-pay

## 2017-03-12 ENCOUNTER — Emergency Department (HOSPITAL_COMMUNITY)
Admission: EM | Admit: 2017-03-12 | Discharge: 2017-03-12 | Disposition: A | Discharge status: Home / Self Care | Payer: Self-pay | Attending: Emergency Medicine | Admitting: Emergency Medicine

## 2017-03-12 DIAGNOSIS — R110 Nausea: Secondary | ICD-10-CM | POA: Insufficient documentation

## 2017-03-12 DIAGNOSIS — Z79899 Other long term (current) drug therapy: Secondary | ICD-10-CM | POA: Insufficient documentation

## 2017-03-12 DIAGNOSIS — R519 Headache, unspecified: Secondary | ICD-10-CM

## 2017-03-12 DIAGNOSIS — R42 Dizziness and giddiness: Secondary | ICD-10-CM | POA: Insufficient documentation

## 2017-03-12 DIAGNOSIS — Z87891 Personal history of nicotine dependence: Secondary | ICD-10-CM | POA: Insufficient documentation

## 2017-03-12 DIAGNOSIS — R51 Headache: Secondary | ICD-10-CM | POA: Insufficient documentation

## 2017-03-12 DIAGNOSIS — H538 Other visual disturbances: Secondary | ICD-10-CM | POA: Insufficient documentation

## 2017-03-12 DIAGNOSIS — R5383 Other fatigue: Secondary | ICD-10-CM | POA: Insufficient documentation

## 2017-03-12 HISTORY — DX: Headache: R51

## 2017-03-12 HISTORY — DX: Cervicalgia: M54.2

## 2017-03-12 HISTORY — DX: Chronic fatigue, unspecified: R53.82

## 2017-03-12 HISTORY — DX: Dorsalgia, unspecified: M54.9

## 2017-03-12 HISTORY — DX: Other chronic pain: G89.29

## 2017-03-12 HISTORY — DX: Headache, unspecified: R51.9

## 2017-03-12 HISTORY — DX: Major depressive disorder, single episode, unspecified: F32.9

## 2017-03-12 HISTORY — DX: Occipital neuralgia: M54.81

## 2017-03-12 HISTORY — DX: Depression, unspecified: F32.A

## 2017-03-12 HISTORY — DX: Sciatica, unspecified side: M54.30

## 2017-03-12 HISTORY — DX: Dizziness and giddiness: R42

## 2017-03-12 LAB — COMPREHENSIVE METABOLIC PANEL
ALBUMIN: 4.6 g/dL (ref 3.5–5.0)
ALK PHOS: 73 U/L (ref 38–126)
ALT: 18 U/L (ref 14–54)
ANION GAP: 8 (ref 5–15)
AST: 22 U/L (ref 15–41)
BUN: 13 mg/dL (ref 6–20)
CALCIUM: 9.3 mg/dL (ref 8.9–10.3)
CO2: 22 mmol/L (ref 22–32)
Chloride: 108 mmol/L (ref 101–111)
Creatinine, Ser: 0.88 mg/dL (ref 0.44–1.00)
GFR calc non Af Amer: >60 mL/min (ref >60)
GLUCOSE: 124 mg/dL — AB (ref 65–99)
POTASSIUM: 3.5 mmol/L (ref 3.5–5.1)
SODIUM: 138 mmol/L (ref 135–145)
TOTAL PROTEIN: 7.6 g/dL (ref 6.5–8.1)
Total Bilirubin: 0.4 mg/dL (ref 0.3–1.2)

## 2017-03-12 LAB — CBG MONITORING, ED
GLUCOSE-CAPILLARY: 118 mg/dL — AB (ref 65–99)
GLUCOSE-CAPILLARY: 131 mg/dL — AB (ref 65–99)

## 2017-03-12 LAB — CBC WITH DIFFERENTIAL/PLATELET
Basophils Absolute: 0 10*3/uL (ref 0.0–0.1)
Basophils Relative: 0 %
EOS ABS: 0.1 10*3/uL (ref 0.0–0.7)
EOS PCT: 2 %
HEMATOCRIT: 35.9 % — AB (ref 36.0–46.0)
Hemoglobin: 11.4 g/dL — ABNORMAL LOW (ref 12.0–15.0)
LYMPHS ABS: 1.2 10*3/uL (ref 0.7–4.0)
LYMPHS PCT: 18 %
MCH: 26 pg (ref 26.0–34.0)
MCHC: 31.8 g/dL (ref 30.0–36.0)
MCV: 82 fL (ref 78.0–100.0)
Monocytes Absolute: 0.5 10*3/uL (ref 0.1–1.0)
Monocytes Relative: 7 %
NEUTROS PCT: 73 %
Neutro Abs: 4.8 10*3/uL (ref 1.7–7.7)
Platelets: 248 10*3/uL (ref 150–400)
RBC: 4.38 MIL/uL (ref 3.87–5.11)
RDW: 20 % — AB (ref 11.5–15.5)
WBC: 6.6 10*3/uL (ref 4.0–10.5)

## 2017-03-12 LAB — LIPASE, BLOOD: Lipase: 34 U/L (ref 11–51)

## 2017-03-12 LAB — URINALYSIS, ROUTINE W REFLEX MICROSCOPIC
BILIRUBIN URINE: NEGATIVE
Glucose, UA: NEGATIVE mg/dL
Hgb urine dipstick: NEGATIVE
KETONES UR: NEGATIVE mg/dL
LEUKOCYTES UA: NEGATIVE
NITRITE: NEGATIVE
PROTEIN: NEGATIVE mg/dL
Specific Gravity, Urine: 1.002 — ABNORMAL LOW (ref 1.005–1.030)
pH: 6 (ref 5.0–8.0)

## 2017-03-12 LAB — PREGNANCY, URINE: Preg Test, Ur: NEGATIVE

## 2017-03-12 LAB — TROPONIN I

## 2017-03-12 MED ORDER — METOCLOPRAMIDE HCL 5 MG/ML IJ SOLN
10.0000 mg | Freq: Once | INTRAMUSCULAR | Status: AC
  Administered 2017-03-12: 10 mg via INTRAVENOUS
  Filled 2017-03-12: qty 2

## 2017-03-12 MED ORDER — METOCLOPRAMIDE HCL 10 MG PO TABS: 10.0000 mg | ORAL_TABLET | Freq: Four times a day (QID) | ORAL | 0 refills | Status: DC | PRN

## 2017-03-12 MED ORDER — SODIUM CHLORIDE 0.9 % IV BOLUS (SEPSIS)
1000.0000 mL | Freq: Once | INTRAVENOUS | Status: AC
  Administered 2017-03-12: 1000 mL via INTRAVENOUS

## 2017-03-12 MED ORDER — DIPHENHYDRAMINE HCL 50 MG/ML IJ SOLN
50.0000 mg | Freq: Once | INTRAMUSCULAR | Status: AC
Start: 2017-03-12 — End: 2017-03-12
  Administered 2017-03-12: 50 mg via INTRAVENOUS
  Filled 2017-03-12: qty 1

## 2017-03-12 MED ORDER — KETOROLAC TROMETHAMINE 30 MG/ML IJ SOLN
30.0000 mg | Freq: Once | INTRAMUSCULAR | Status: AC
  Administered 2017-03-12: 30 mg via INTRAVENOUS
  Filled 2017-03-12: qty 1

## 2017-03-12 NOTE — ED Provider Notes (Signed)
[facility] DEPT Provider Note   CSN: [id number] Arrival date & time: 03/12/17  1526     History   Chief Complaint Chief Complaint  Patient presents with  . Dizziness    HPI Destiny Beck is a 46 y.o. female.  HPI  Pt was seen at 1540. Per pt, c/o gradual onset and persistence of constant nausea for the past 3+ months. Has been associated with acute flair of her chronic headaches, "dizziness" (lightheadedness), blurry vision, and fatigue. Pt states she took her CBG at work [employer] and it was "230." Pt has been evaluated by her PMD for these complaints, had labs completed, but does not recall the definitive diagnosis. Pt denies any change in her usual chronic symptoms over the past 3 months. Describes the headache as per her usual chronic headache pain pattern for many months.  Denies headache was sudden or maximal in onset or at any time.  Denies visual field cuts, no focal motor weakness, no tingling/numbness in extremities, no fevers, no neck pain, no rash, no CP/palpitations, no SOB/cough, no abd pain, no vomiting/diarrhea.    Past Medical History:  Diagnosis Date  . Abscess   . Anxiety   . Chronic back pain   . Chronic fatigue   . Chronic neck pain   . Depression   . Dizziness   . Headache   . Occipital neuralgia   . Sciatica     There are no active problems to display for this patient.   Past Surgical History:  Procedure Laterality Date  . CESAREAN SECTION      OB History    Gravida Para Term Preterm AB Living   3         3   SAB TAB Ectopic Multiple Live Births                   Home Medications    Prior to Admission medications   Medication Sig Start Date End Date Taking? Authorizing Provider  acetaminophen (TYLENOL) 500 MG tablet Take 1,000 mg by mouth every 6 (six) hours as needed for moderate pain.    [provider]  buPROPion (WELLBUTRIN) 100 MG tablet Take 100-150 mg by mouth 2 (two) times daily. 150 in the morning and 100 in the evening     [provider]  cyclobenzaprine (FLEXERIL) 10 MG tablet Take 1 tablet (10 mg total) by mouth 3 (three) times daily as needed. 10/29/16   Triplett, Tammy, PA-C  escitalopram (LEXAPRO) 20 MG tablet Take 20 mg by mouth daily.     [provider]  gabapentin (NEURONTIN) 300 MG capsule Take 300 mg by mouth 3 (three) times daily.    [provider]  ibuprofen (ADVIL,MOTRIN) 200 MG tablet Take 800 mg by mouth every 6 (six) hours as needed.    [provider]  LORazepam (ATIVAN) 0.5 MG tablet Take 0.5 mg by mouth 2 (two) times daily.    [provider]  Multiple Vitamin (MULTIVITAMIN WITH MINERALS) TABS tablet Take 1 tablet by mouth daily.    [provider]  naproxen sodium (ANAPROX) 220 MG tablet Take 220 mg by mouth daily as needed (pain).    [provider]  predniSONE (DELTASONE) 10 MG tablet Take 6 tablets day one, 5 tablets day two, 4 tablets day three, 3 tablets day four, 2 tablets day five, then 1 tablet day six Patient not taking: Reported on 02/11/2017 10/29/16   Kem Parkinson, PA-C  promethazine (PHENERGAN) 25 MG tablet  Take 1 tablet (25 mg total) by mouth every 6 (six) hours as needed. 02/11/17   Fredia Sorrow, MD    Family History History reviewed. No pertinent family history.  Social History Social History  Substance Use Topics  . Smoking status: Former Smoker    Packs/day: 0.50    Types: Cigarettes    Quit date: 01/07/2017  . Smokeless tobacco: Never Used     Comment: occasional smoker   . Alcohol use Yes     Comment: Occ     Allergies   Percocet [oxycodone-acetaminophen]   Review of Systems Review of Systems ROS: Statement: All systems negative except as marked or noted in the HPI; Constitutional: Negative for fever and chills. +fatigue.; ; Eyes: Negative for eye pain, redness and discharge. ; ; ENMT: Negative for ear pain, hoarseness, nasal congestion, sinus pressure and sore throat. ; ; Cardiovascular:  Negative for chest pain, palpitations, diaphoresis, dyspnea and peripheral edema. ; ; Respiratory: Negative for cough, wheezing and stridor. ; ; Gastrointestinal: +nausea. Negative for vomiting, diarrhea, abdominal pain, blood in stool, hematemesis, jaundice and rectal bleeding. . ; ; Genitourinary: Negative for dysuria, flank pain and hematuria. ; ; Musculoskeletal: Negative for back pain and neck pain. Negative for swelling and trauma.; ; Skin: Negative for pruritus, rash, abrasions, blisters, bruising and skin lesion.; ; Neuro: +chronic headache, "dizziness" (lightheadedness). Negative for neck stiffness. Negative for altered level of consciousness, altered mental status, extremity weakness, paresthesias, involuntary movement, seizure and syncope.       Physical Exam Updated Vital Signs BP 118/83 (BP Location: Left Arm)   Pulse 87   Temp 99.3 F (37.4 C) (Oral)   Resp 11   Ht 5' (1.524 m)   Wt 70.8 kg (156 lb)   LMP 02/09/2017   SpO2 98%   BMI 30.47 kg/m   17:07 Orthostatic Vital Signs VB  Orthostatic Lying   BP- Lying: 103/72  Pulse- Lying: 58      Orthostatic Sitting  BP- Sitting: 110/73  Pulse- Sitting: 67      Orthostatic Standing at 0 minutes  BP- Standing at 0 minutes: 108/75  Pulse- Standing at 0 minutes: 65     Physical Exam 1545: Physical examination:  Nursing notes reviewed; Vital signs and O2 SAT reviewed;  Constitutional: Well developed, Well nourished, Well hydrated, In no acute distress; Head:  Normocephalic, atraumatic; Eyes: EOMI, PERRL, No scleral icterus; ENMT: TM's clear bilat. +edemetous nasal turbinates bilat with clear rhinorrhea. Mouth and pharynx normal, Mucous membranes moist; Neck: Supple, Full range of motion, No lymphadenopathy; Cardiovascular: Regular rate and rhythm, No murmur, rub, or gallop; Respiratory: Breath sounds clear & equal bilaterally, No rales, rhonchi, wheezes.  Speaking full sentences with ease, Normal respiratory effort/excursion;  Chest: Nontender, Movement normal; Abdomen: Soft, Nontender, Nondistended, Normal bowel sounds; Genitourinary: No CVA tenderness; Extremities: Pulses normal, No tenderness, No edema, No calf edema or asymmetry.; Neuro: AA&Ox3, Major CN grossly intact. No facial droop. Speech clear. No gross focal motor or sensory deficits in extremities.; Skin: Color normal, Warm, Dry.; Psych:  Affect flat, poor eye contact.    ED Treatments / Results  Labs (all labs ordered are listed, but only abnormal results are displayed)   EKG  EKG Interpretation  Date/Time:  Saturday March 12 2017 15:44:29 EDT Ventricular Rate:  88 PR Interval:    QRS Duration: 68 QT Interval:  361 QTC Calculation: 437 R Axis:   77 Text Interpretation:  Sinus rhythm No old tracing to compare Confirmed by Thurnell Garbe,  Nunzio Cory [id number]) on 03/12/2017 4:05:11 PM       Radiology   Procedures Procedures (including critical care time)  Medications Ordered in ED Medications  metoCLOPramide (REGLAN) injection 10 mg (not administered)  ketorolac (TORADOL) 30 MG/ML injection 30 mg (not administered)  sodium chloride 0.9 % bolus 1,000 mL (1,000 mLs Intravenous New Bag/Given 03/12/17 1555)  diphenhydrAMINE (BENADRYL) injection 50 mg (50 mg Intravenous Given 03/12/17 1556)     Initial Impression / Assessment and Plan / ED Course  I have reviewed the triage vital signs and the nursing notes.  Pertinent labs & imaging results that were available during my care of the patient were reviewed by me and considered in my medical decision making (see chart for details).  MDM Reviewed: previous chart, nursing note and vitals Reviewed previous: labs Interpretation: labs, ECG, x-ray and CT scan    Results for orders placed or performed during the hospital encounter of 03/12/17  Urinalysis, Routine w reflex microscopic  Result Value Ref Range   Color, Urine COLORLESS (A) YELLOW   APPearance CLEAR CLEAR   Specific Gravity, Urine 1.002  (L) 1.005 - 1.030   pH 6.0 5.0 - 8.0   Glucose, UA NEGATIVE NEGATIVE mg/dL   Hgb urine dipstick NEGATIVE NEGATIVE   Bilirubin Urine NEGATIVE NEGATIVE   Ketones, ur NEGATIVE NEGATIVE mg/dL   Protein, ur NEGATIVE NEGATIVE mg/dL   Nitrite NEGATIVE NEGATIVE   Leukocytes, UA NEGATIVE NEGATIVE  Pregnancy, urine  Result Value Ref Range   Preg Test, Ur NEGATIVE NEGATIVE  Comprehensive metabolic panel  Result Value Ref Range   Sodium 138 135 - 145 mmol/L   Potassium 3.5 3.5 - 5.1 mmol/L   Chloride 108 101 - 111 mmol/L   CO2 22 22 - 32 mmol/L   Glucose, Bld 124 (H) 65 - 99 mg/dL   BUN 13 6 - 20 mg/dL   Creatinine, Ser 0.88 0.44 - 1.00 mg/dL   Calcium 9.3 8.9 - 10.3 mg/dL   Total Protein 7.6 6.5 - 8.1 g/dL   Albumin 4.6 3.5 - 5.0 g/dL   AST 22 15 - 41 U/L   ALT 18 14 - 54 U/L   Alkaline Phosphatase 73 38 - 126 U/L   Total Bilirubin 0.4 0.3 - 1.2 mg/dL   GFR calc non Af Amer >60 >60 mL/min   GFR calc Af Amer >60 >60 mL/min   Anion gap 8 5 - 15  Lipase, blood  Result Value Ref Range   Lipase 34 11 - 51 U/L  Troponin I  Result Value Ref Range   Troponin I <0.03 <0.03 ng/mL  CBC with Differential  Result Value Ref Range   WBC 6.6 4.0 - 10.5 K/uL   RBC 4.38 3.87 - 5.11 MIL/uL   Hemoglobin 11.4 (L) 12.0 - 15.0 g/dL   HCT 35.9 (L) 36.0 - 46.0 %   MCV 82.0 78.0 - 100.0 fL   MCH 26.0 26.0 - 34.0 pg   MCHC 31.8 30.0 - 36.0 g/dL   RDW 20.0 (H) 11.5 - 15.5 %   Platelets 248 150 - 400 K/uL   Neutrophils Relative % 73 %   Neutro Abs 4.8 1.7 - 7.7 K/uL   Lymphocytes Relative 18 %   Lymphs Abs 1.2 0.7 - 4.0 K/uL   Monocytes Relative 7 %   Monocytes Absolute 0.5 0.1 - 1.0 K/uL   Eosinophils Relative 2 %   Eosinophils Absolute 0.1 0.0 - 0.7 K/uL   Basophils Relative 0 %  Basophils Absolute 0.0 0.0 - 0.1 K/uL  CBG monitoring, ED  Result Value Ref Range   Glucose-Capillary 118 (H) 65 - 99 mg/dL  CBG monitoring, ED  Result Value Ref Range   Glucose-Capillary 131 (H) 65 - 99 mg/dL    Dg Chest 2 View Result Date: 03/12/2017 CLINICAL DATA:  Weakness EXAM: CHEST  2 VIEW COMPARISON:  12/25/2012 chest radiograph. FINDINGS: Stable cardiomediastinal silhouette with top-normal heart some. No pneumothorax. No pleural effusion. Lungs appear clear, with no acute consolidative airspace disease and no pulmonary edema. IMPRESSION: No active cardiopulmonary disease. Electronically Signed   By: Ilona Sorrel M.D.   On: 03/12/2017 17:03   Ct Head Wo Contrast Result Date: 03/12/2017 CLINICAL DATA:  Headache and blurred vision EXAM: CT HEAD WITHOUT CONTRAST TECHNIQUE: Contiguous axial images were obtained from the base of the skull through the vertex without intravenous contrast. COMPARISON:  None. FINDINGS: Brain: No evidence of acute infarction, hemorrhage, hydrocephalus, extra-axial collection or mass lesion/mass effect. Vascular: No hyperdense vessel or unexpected calcification. Skull: Normal. Negative for fracture or focal lesion. Sinuses/Orbits: No acute finding. Other: None. IMPRESSION: No focal acute intracranial abnormality identified. Electronically Signed   By: Abelardo Diesel M.D.   On: 03/12/2017 16:45    1835:  Pt has tol PO well without N/V. Headache improved after usual meds cocktail. Pt states she is ready to go home now; family would like to take her home now. Tx symptomatically at this time.  Dx and testing d/w pt and family.  Questions answered.  Verb understanding, agreeable to d/c home with outpt f/u.    Final Clinical Impressions(s) / ED Diagnoses   Final diagnoses:  None    New Prescriptions New Prescriptions   No medications on file     Francine Graven, DO 03/16/17 1347

## 2017-03-12 NOTE — Discharge Instructions (Signed)
Take over the counter tylenol, ibuprofen and benadryl, as directed on packaging, with the prescription given to you today, as needed for headache.  Keep a headache diary.  Call your regular medical doctor on Tuesday to schedule a follow up appointment within the next 3  days.  Call the Neurologist on Tuesday to schedule a follow up appointment within the next week. Return to the Emergency Department immediately sooner if worsening.

## 2017-03-12 NOTE — ED Notes (Signed)
Pt given crackers and gingerale.

## 2017-03-12 NOTE — ED Triage Notes (Signed)
Patient complaining of headache, dizziness, and blurry vision x 45 minutes. States symptoms have been intermittent "for a while" but just started again prior to arrival to ER.

## 2017-03-12 NOTE — ED Notes (Signed)
Dr. Thurnell Garbe informed of pt symptoms.

## 2017-06-15 DIAGNOSIS — F4001 Agoraphobia with panic disorder: Secondary | ICD-10-CM | POA: Insufficient documentation

## 2017-09-05 DIAGNOSIS — F321 Major depressive disorder, single episode, moderate: Secondary | ICD-10-CM | POA: Insufficient documentation

## 2018-03-21 ENCOUNTER — Emergency Department (HOSPITAL_COMMUNITY)
Admission: EM | Admit: 2018-03-21 | Discharge: 2018-03-21 | Disposition: A | Discharge status: Home / Self Care | Payer: 59 | Attending: Emergency Medicine | Admitting: Emergency Medicine

## 2018-03-21 ENCOUNTER — Encounter (HOSPITAL_COMMUNITY): Payer: Self-pay | Admitting: Emergency Medicine

## 2018-03-21 ENCOUNTER — Other Ambulatory Visit: Payer: Self-pay

## 2018-03-21 DIAGNOSIS — R1013 Epigastric pain: Secondary | ICD-10-CM

## 2018-03-21 DIAGNOSIS — Z79899 Other long term (current) drug therapy: Secondary | ICD-10-CM | POA: Insufficient documentation

## 2018-03-21 DIAGNOSIS — Z87891 Personal history of nicotine dependence: Secondary | ICD-10-CM | POA: Insufficient documentation

## 2018-03-21 DIAGNOSIS — R109 Unspecified abdominal pain: Secondary | ICD-10-CM | POA: Insufficient documentation

## 2018-03-21 LAB — CBC WITH DIFFERENTIAL/PLATELET
BASOS PCT: 0 %
Basophils Absolute: 0 10*3/uL (ref 0.0–0.1)
EOS ABS: 0.3 10*3/uL (ref 0.0–0.7)
Eosinophils Relative: 3 %
HEMATOCRIT: 37 % (ref 36.0–46.0)
HEMOGLOBIN: 11.4 g/dL — AB (ref 12.0–15.0)
LYMPHS ABS: 1.7 10*3/uL (ref 0.7–4.0)
Lymphocytes Relative: 18 %
MCH: 25.9 pg — AB (ref 26.0–34.0)
MCHC: 30.8 g/dL (ref 30.0–36.0)
MCV: 84.1 fL (ref 78.0–100.0)
MONO ABS: 0.7 10*3/uL (ref 0.1–1.0)
Monocytes Relative: 7 %
NEUTROS ABS: 7 10*3/uL (ref 1.7–7.7)
NEUTROS PCT: 72 %
Platelets: 277 10*3/uL (ref 150–400)
RBC: 4.4 MIL/uL (ref 3.87–5.11)
RDW: 16.3 % — AB (ref 11.5–15.5)
WBC: 9.8 10*3/uL (ref 4.0–10.5)

## 2018-03-21 LAB — COMPREHENSIVE METABOLIC PANEL
ALK PHOS: 85 U/L (ref 38–126)
ALT: 20 U/L (ref 0–44)
AST: 20 U/L (ref 15–41)
Albumin: 4.4 g/dL (ref 3.5–5.0)
Anion gap: 8 (ref 5–15)
BUN: 18 mg/dL (ref 6–20)
CALCIUM: 9.6 mg/dL (ref 8.9–10.3)
CHLORIDE: 105 mmol/L (ref 98–111)
CO2: 25 mmol/L (ref 22–32)
CREATININE: 1.15 mg/dL — AB (ref 0.44–1.00)
GFR calc Af Amer: >60 mL/min (ref >60)
GFR calc non Af Amer: 57 mL/min — ABNORMAL LOW (ref >60)
GLUCOSE: 89 mg/dL (ref 70–99)
Potassium: 4 mmol/L (ref 3.5–5.1)
SODIUM: 138 mmol/L (ref 135–145)
Total Bilirubin: 0.3 mg/dL (ref 0.3–1.2)
Total Protein: 7.9 g/dL (ref 6.5–8.1)

## 2018-03-21 LAB — URINALYSIS, ROUTINE W REFLEX MICROSCOPIC
BILIRUBIN URINE: NEGATIVE
Glucose, UA: NEGATIVE mg/dL
Hgb urine dipstick: NEGATIVE
KETONES UR: 20 mg/dL — AB
Leukocytes, UA: NEGATIVE
NITRITE: NEGATIVE
PH: 5 (ref 5.0–8.0)
Protein, ur: NEGATIVE mg/dL
SPECIFIC GRAVITY, URINE: 1.029 (ref 1.005–1.030)

## 2018-03-21 LAB — LIPASE, BLOOD: LIPASE: 44 U/L (ref 11–51)

## 2018-03-21 MED ORDER — FAMOTIDINE IN NACL 20-0.9 MG/50ML-% IV SOLN
20.0000 mg | Freq: Once | INTRAVENOUS | Status: AC
  Administered 2018-03-21: 20 mg via INTRAVENOUS
  Filled 2018-03-21: qty 50

## 2018-03-21 MED ORDER — SUCRALFATE 1 GM/10ML PO SUSP
1.0000 g | Freq: Three times a day (TID) | ORAL | Status: DC
  Administered 2018-03-21: 1 g via ORAL
  Filled 2018-03-21: qty 10

## 2018-03-21 MED ORDER — FAMOTIDINE 20 MG PO TABS: 20.0000 mg | ORAL_TABLET | Freq: Two times a day (BID) | ORAL | 0 refills | Status: DC

## 2018-03-21 MED ORDER — SUCRALFATE 1 G PO TABS: 1.0000 g | ORAL_TABLET | Freq: Three times a day (TID) | ORAL | 0 refills | Status: DC

## 2018-03-21 MED ORDER — SODIUM CHLORIDE 0.9 % IV BOLUS
1000.0000 mL | Freq: Once | INTRAVENOUS | Status: AC
  Administered 2018-03-21: 1000 mL via INTRAVENOUS

## 2018-03-21 NOTE — ED Provider Notes (Signed)
[facility] EMERGENCY DEPARTMENT Provider Note   CSN: [id number] Arrival date & time: 03/21/18  1453     History   Chief Complaint Chief Complaint  Patient presents with  . Abdominal Pain    HPI Destiny Beck is a 47 y.o. female.  HPI Patient presents with concern of epigastric pain, nausea, burning sensation. Onset was about 3 days ago, and since onset symptoms have been persistent, worsening. She notes that she has anorexia, and increasing symptoms with food intake. Symptoms are also worse with supine positioning with a burning sensation in her throat. No syncope, no other chest pain, no dyspnea. Patient has no history of gastritis, nor GI investigation. She notes that she takes ibuprofen and NSAIDs frequently for pain, and has substantial ongoing life stress.  Past Medical History:  Diagnosis Date  . Abscess   . Anxiety   . Chronic back pain   . Chronic fatigue   . Chronic neck pain   . Depression   . Dizziness   . Headache   . Occipital neuralgia   . Sciatica     There are no active problems to display for this patient.   Past Surgical History:  Procedure Laterality Date  . CESAREAN SECTION       OB History    Gravida  3   Para      Term      Preterm      AB      Living  3     SAB      TAB      Ectopic      Multiple      Live Births               Home Medications    Prior to Admission medications   Medication Sig Start Date End Date Taking? Authorizing Provider  acetaminophen (TYLENOL) 500 MG tablet Take 1,000 mg by mouth every 6 (six) hours as needed for moderate pain.   Yes [provider]  ALPRAZolam Duanne Moron) 1 MG tablet Take 1 mg by mouth 3 (three) times daily.   Yes [provider]  Aspirin-Salicylamide-Caffeine (BC HEADACHE) 325-95-16 MG TABS Take 1 packet by mouth daily as needed (for pain).    Yes [provider]  buPROPion (WELLBUTRIN XL) 150 MG 24 hr tablet Take 150 mg by mouth every  morning.   Yes [provider]  cyclobenzaprine (FLEXERIL) 10 MG tablet Take 1 tablet (10 mg total) by mouth 3 (three) times daily as needed. Patient taking differently: Take 10 mg by mouth 3 (three) times daily as needed for muscle spasms.  10/29/16  Yes Triplett, Tammy, PA-C  escitalopram (LEXAPRO) 20 MG tablet Take 20 mg by mouth daily.    Yes [provider]  gabapentin (NEURONTIN) 400 MG capsule Take 400 mg by mouth 3 (three) times daily.   Yes [provider]  ibuprofen (ADVIL,MOTRIN) 200 MG tablet Take 600-800 mg by mouth every 6 (six) hours as needed.    Yes [provider]    Family History History reviewed. No pertinent family history.  Social History Social History   Tobacco Use  . Smoking status: Former Smoker    Packs/day: 0.50    Types: Cigarettes    Last attempt to quit: 01/07/2017    Years since quitting: 1.2  . Smokeless tobacco: Never Used  . Tobacco comment: occasional smoker   Substance Use Topics  . Alcohol use: Yes    Comment: Occ  .  Drug use: No     Allergies   Percocet [oxycodone-acetaminophen]   Review of Systems Review of Systems  Constitutional:       Per HPI, otherwise negative  HENT:       Per HPI, otherwise negative  Respiratory:       Per HPI, otherwise negative  Cardiovascular:       Per HPI, otherwise negative  Gastrointestinal: Negative for vomiting.  Endocrine:       Negative aside from HPI  Genitourinary:       Neg aside from HPI   Musculoskeletal:       Per HPI, otherwise negative  Skin: Negative.   Neurological: Negative for syncope.     Physical Exam Updated Vital Signs BP 113/83   Pulse 79   Temp 98.9 F (37.2 C) (Oral)   Resp 15   Ht 5\' 1"  (1.549 m)   Wt 64.4 kg   LMP 02/18/2018   SpO2 97%   BMI 26.83 kg/m   Physical Exam  Constitutional: She is oriented to person, place, and time. She appears well-developed and well-nourished. No distress.  HENT:  Head: Normocephalic and  atraumatic.  Eyes: Conjunctivae and EOM are normal.  Cardiovascular: Normal rate and regular rhythm.  Pulmonary/Chest: Effort normal and breath sounds normal. No stridor. No respiratory distress.  Abdominal: She exhibits no distension. There is tenderness in the epigastric area.  Musculoskeletal: She exhibits no edema.  Neurological: She is alert and oriented to person, place, and time. No cranial nerve deficit.  Skin: Skin is warm and dry.  Psychiatric: She has a normal mood and affect.  Nursing note and vitals reviewed.    ED Treatments / Results  Labs (all labs ordered are listed, but only abnormal results are displayed) Labs Reviewed  COMPREHENSIVE METABOLIC PANEL - Abnormal; Notable for the following components:      Result Value   Creatinine, Ser 1.15 (*)    GFR calc non Af Amer 57 (*)    All other components within normal limits  CBC WITH DIFFERENTIAL/PLATELET - Abnormal; Notable for the following components:   Hemoglobin 11.4 (*)    MCH 25.9 (*)    RDW 16.3 (*)    All other components within normal limits  URINALYSIS, ROUTINE W REFLEX MICROSCOPIC - Abnormal; Notable for the following components:   APPearance HAZY (*)    Ketones, ur 20 (*)    All other components within normal limits  LIPASE, BLOOD    EKG None  Radiology No results found.  Procedures Procedures (including critical care time)  Medications Ordered in ED Medications  sucralfate (CARAFATE) 1 GM/10ML suspension 1 g (1 g Oral Given 03/21/18 1638)  sodium chloride 0.9 % bolus 1,000 mL (0 mLs Intravenous Stopped 03/21/18 1748)  famotidine (PEPCID) IVPB 20 mg premix (0 mg Intravenous Stopped 03/21/18 1707)     Initial Impression / Assessment and Plan / ED Course  I have reviewed the triage vital signs and the nursing notes.  Pertinent labs & imaging results that were available during my care of the patient were reviewed by me and considered in my medical decision making (see chart for details).      7:20 PM On repeat exam the patient is in similar condition. Labs reviewed, unremarkable, with no evidence for pancreatitis, no leukocytosis, no evidence for hepatobiliary dysfunction.  Had improvement in her condition with oral sucralfate, IV Pepcid, fluids. She has been tolerant of oral intake.  I discussed likelihood of gastroesophageal etiology given  the otherwise reassuring findings, absence of evidence for peritonitis, bacteremia, sepsis. Patient will start a course of H2 therapy, follow-up with primary care as needed.  Final Clinical Impressions(s) / ED Diagnoses  Abdominal pain   Carmin Muskrat, MD 03/21/18 1921

## 2018-03-21 NOTE — Discharge Instructions (Signed)
As discussed, it is important that you follow-up with your physician, and begin taking medication as prescribed. Your pain is likely due to irritation of your stomach and or esophagus. Medication should improve her condition over the next 2 or 3 days. If you develop new, or concerning changes be sure to return here.

## 2018-03-21 NOTE — ED Notes (Signed)
Pt given crackers and gingerale.

## 2018-03-21 NOTE — ED Triage Notes (Signed)
PT c/o upper abdominal burning and nausea x2 days. Last BM today and was normal. Pt denies any urinary symptoms.

## 2018-06-01 ENCOUNTER — Ambulatory Visit (HOSPITAL_COMMUNITY)
Admission: RE | Admit: 2018-06-01 | Discharge: 2018-06-01 | Disposition: A | Discharge status: Home / Self Care | Payer: Medicaid Other | Source: Ambulatory Visit | Attending: Adult Health Nurse Practitioner | Admitting: Adult Health Nurse Practitioner

## 2018-06-01 ENCOUNTER — Other Ambulatory Visit (HOSPITAL_COMMUNITY): Payer: Self-pay | Admitting: Adult Health Nurse Practitioner

## 2018-06-01 DIAGNOSIS — M5441 Lumbago with sciatica, right side: Secondary | ICD-10-CM | POA: Insufficient documentation

## 2018-06-01 DIAGNOSIS — M4807 Spinal stenosis, lumbosacral region: Secondary | ICD-10-CM | POA: Insufficient documentation

## 2018-06-01 DIAGNOSIS — N200 Calculus of kidney: Secondary | ICD-10-CM | POA: Insufficient documentation

## 2018-06-01 DIAGNOSIS — M5440 Lumbago with sciatica, unspecified side: Secondary | ICD-10-CM | POA: Insufficient documentation

## 2018-06-01 DIAGNOSIS — Z79899 Other long term (current) drug therapy: Secondary | ICD-10-CM | POA: Insufficient documentation

## 2018-06-14 ENCOUNTER — Other Ambulatory Visit: Payer: Self-pay | Admitting: Adult Health Nurse Practitioner

## 2018-06-14 ENCOUNTER — Other Ambulatory Visit (HOSPITAL_COMMUNITY): Payer: Self-pay | Admitting: Adult Health Nurse Practitioner

## 2018-06-14 ENCOUNTER — Ambulatory Visit (HOSPITAL_COMMUNITY)
Admission: RE | Admit: 2018-06-14 | Discharge: 2018-06-14 | Disposition: A | Discharge status: Home / Self Care | Payer: Medicaid Other | Source: Ambulatory Visit | Attending: Adult Health Nurse Practitioner | Admitting: Adult Health Nurse Practitioner

## 2018-06-14 DIAGNOSIS — M5030 Other cervical disc degeneration, unspecified cervical region: Secondary | ICD-10-CM | POA: Diagnosis not present

## 2018-06-14 DIAGNOSIS — K8050 Calculus of bile duct without cholangitis or cholecystitis without obstruction: Secondary | ICD-10-CM

## 2018-06-15 ENCOUNTER — Other Ambulatory Visit (HOSPITAL_COMMUNITY): Payer: Self-pay | Admitting: Adult Health Nurse Practitioner

## 2018-06-15 DIAGNOSIS — K8050 Calculus of bile duct without cholangitis or cholecystitis without obstruction: Secondary | ICD-10-CM

## 2018-06-16 ENCOUNTER — Emergency Department (HOSPITAL_COMMUNITY): Payer: Medicaid Other

## 2018-06-16 ENCOUNTER — Emergency Department (HOSPITAL_COMMUNITY)
Admission: EM | Admit: 2018-06-16 | Discharge: 2018-06-16 | Disposition: A | Discharge status: Home / Self Care | Payer: Medicaid Other | Attending: Emergency Medicine | Admitting: Emergency Medicine

## 2018-06-16 ENCOUNTER — Encounter (HOSPITAL_COMMUNITY): Payer: Self-pay

## 2018-06-16 ENCOUNTER — Other Ambulatory Visit: Payer: Self-pay

## 2018-06-16 DIAGNOSIS — M47812 Spondylosis without myelopathy or radiculopathy, cervical region: Secondary | ICD-10-CM | POA: Diagnosis not present

## 2018-06-16 DIAGNOSIS — Z79899 Other long term (current) drug therapy: Secondary | ICD-10-CM | POA: Diagnosis not present

## 2018-06-16 DIAGNOSIS — Z87891 Personal history of nicotine dependence: Secondary | ICD-10-CM | POA: Diagnosis not present

## 2018-06-16 DIAGNOSIS — M542 Cervicalgia: Secondary | ICD-10-CM | POA: Diagnosis present

## 2018-06-16 NOTE — ED Provider Notes (Signed)
[facility] EMERGENCY DEPARTMENT Provider Note   CSN: [id number] Arrival date & time: 06/16/18  1057     History   Chief Complaint No chief complaint on file.   HPI Destiny Beck is a 47 y.o. female.  Patient is a 47 year old female who presents to the emergency department because of neck pain and back pain and an abnormality noted on plain x-ray films.  The patient states that she has chronic back pain.  She had x-rays about a week ago, and her primary physician advised her to come here to be seen because of a question of a dens fracture on the x-ray.  She does not give a history of recent injury.  She has some pain moving her neck, but has had problems with that for some time.  There is been no double vision, no loss of consciousness, no problems with swallowing.  The history is provided by the patient.    Past Medical History:  Diagnosis Date  . Abscess   . Anxiety   . Chronic back pain   . Chronic fatigue   . Chronic neck pain   . Depression   . Dizziness   . Headache   . Occipital neuralgia   . Sciatica     There are no active problems to display for this patient.   Past Surgical History:  Procedure Laterality Date  . CESAREAN SECTION       OB History    Gravida  3   Para      Term      Preterm      AB      Living  3     SAB      TAB      Ectopic      Multiple      Live Births               Home Medications    Prior to Admission medications   Medication Sig Start Date End Date Taking? Authorizing Provider  acetaminophen (TYLENOL) 500 MG tablet Take 1,000 mg by mouth every 6 (six) hours as needed for moderate pain.    [provider]  ALPRAZolam Duanne Moron) 1 MG tablet Take 1 mg by mouth 3 (three) times daily.    [provider]  Aspirin-Salicylamide-Caffeine (BC HEADACHE) 325-95-16 MG TABS Take 1 packet by mouth daily as needed (for pain).     [provider]  buPROPion (WELLBUTRIN XL) 150 MG 24 hr tablet  Take 150 mg by mouth every morning.    [provider]  cyclobenzaprine (FLEXERIL) 10 MG tablet Take 1 tablet (10 mg total) by mouth 3 (three) times daily as needed. Patient taking differently: Take 10 mg by mouth 3 (three) times daily as needed for muscle spasms.  10/29/16   Triplett, Tammy, PA-C  escitalopram (LEXAPRO) 20 MG tablet Take 20 mg by mouth daily.     [provider]  famotidine (PEPCID) 20 MG tablet Take 1 tablet (20 mg total) by mouth 2 (two) times daily. 03/21/18   Carmin Muskrat, MD  gabapentin (NEURONTIN) 400 MG capsule Take 400 mg by mouth 3 (three) times daily.    [provider]  ibuprofen (ADVIL,MOTRIN) 200 MG tablet Take 600-800 mg by mouth every 6 (six) hours as needed.     [provider]  sucralfate (CARAFATE) 1 g tablet Take 1 tablet (1 g total) by mouth 4 (four) times daily -  with meals and at bedtime. 03/21/18  Carmin Muskrat, MD    Family History No family history on file.  Social History Social History   Tobacco Use  . Smoking status: Former Smoker    Packs/day: 0.50    Types: Cigarettes    Last attempt to quit: 01/07/2017    Years since quitting: 1.4  . Smokeless tobacco: Never Used  . Tobacco comment: occasional smoker   Substance Use Topics  . Alcohol use: Yes    Comment: Occ  . Drug use: No     Allergies   Percocet [oxycodone-acetaminophen]   Review of Systems Review of Systems  Constitutional: Negative for activity change.       All ROS Neg except as noted in HPI  HENT: Negative for nosebleeds.   Eyes: Negative for photophobia and discharge.  Respiratory: Negative for cough, shortness of breath and wheezing.   Cardiovascular: Negative for chest pain and palpitations.  Gastrointestinal: Negative for abdominal pain and blood in stool.  Genitourinary: Negative for dysuria, frequency and hematuria.  Musculoskeletal: Positive for back pain, neck pain and neck stiffness. Negative for arthralgias.  Skin:  Negative.   Neurological: Negative for dizziness, seizures and speech difficulty.  Psychiatric/Behavioral: Negative for confusion and hallucinations.     Physical Exam Updated Vital Signs BP 133/86 (BP Location: Right Arm)   Pulse 94   Temp 98.8 F (37.1 C) (Oral)   Resp 18   Ht 5' (1.524 m)   Wt 73.9 kg   LMP 05/25/2018   SpO2 100%   BMI 31.83 kg/m   Physical Exam  Constitutional: She is oriented to person, place, and time. She appears well-developed and well-nourished.  Non-toxic appearance.  HENT:  Head: Normocephalic.  Right Ear: Tympanic membrane and external ear normal.  Left Ear: Tympanic membrane and external ear normal.  Eyes: Pupils are equal, round, and reactive to light. EOM and lids are normal.  Neck: Normal range of motion. Neck supple. Carotid bruit is not present.  Cardiovascular: Normal rate, regular rhythm, normal heart sounds, intact distal pulses and normal pulses.  Pulmonary/Chest: Breath sounds normal. No respiratory distress.  Abdominal: Soft. Bowel sounds are normal. There is no tenderness. There is no guarding.  Musculoskeletal:       Cervical back: She exhibits decreased range of motion, bony tenderness and pain. She exhibits no deformity.  Lymphadenopathy:       Head (right side): No submandibular adenopathy present.       Head (left side): No submandibular adenopathy present.    She has no cervical adenopathy.  Neurological: She is alert and oriented to person, place, and time. She has normal strength. No cranial nerve deficit or sensory deficit.  Grip is symmetrical.  No sensory deficits appreciated.  Gait is slow but steady.  No foot drop noted.  Skin: Skin is warm and dry.  Psychiatric: She has a normal mood and affect. Her speech is normal.  Nursing note and vitals reviewed.    ED Treatments / Results  Labs (all labs ordered are listed, but only abnormal results are displayed) Labs Reviewed - No data to  display  EKG None  Radiology Ct Cervical Spine Wo Contrast  Result Date: 06/16/2018 CLINICAL DATA:  Neck and back pain for years. No reported injury. Possible dens fracture on cervical spine radiographs obtained 2 days ago. EXAM: CT CERVICAL SPINE WITHOUT CONTRAST TECHNIQUE: Multidetector CT imaging of the cervical spine was performed without intravenous contrast. Multiplanar CT image reconstructions were also generated. COMPARISON:  Cervical spine radiographs dated 06/14/2018.  FINDINGS: Alignment: Normal. Skull base and vertebrae: No acute fracture. No primary bone lesion or focal pathologic process. Specifically, no dens fracture is seen. Soft tissues and spinal canal: No prevertebral fluid or swelling. No visible canal hematoma. Disc levels: Moderate disc space narrowing with mild to moderate anterior and moderate posterior spur formation at the C5-6 level. Mild anterior and posterior spur formation at the C3-4 level. Upper chest: Clear lung apices. Other: None. IMPRESSION: 1. No fracture or subluxation. 2. Degenerative changes, as described above. Electronically Signed   By: Claudie Revering M.D.   On: 06/16/2018 13:39    Procedures Procedures (including critical care time)  Medications Ordered in ED Medications - No data to display   Initial Impression / Assessment and Plan / ED Course  I have reviewed the triage vital signs and the nursing notes.  Pertinent labs & imaging results that were available during my care of the patient were reviewed by me and considered in my medical decision making (see chart for details).      Final Clinical Impressions(s) / ED Diagnoses MDM  Patient states that she was told by her primary physician that there was some abnormalities of her cervical x-ray films.  She was sent to the emergency department for additional evaluation.  No gross neurologic deficits appreciated on examination.  Gait is slow but steady.  No evidence of any foot drop.  CT scan of the  cervical spine shows degenerative changes at multiple sites, but no fracture, no dislocation.  I discussed these findings with the patient in terms of which she understands.  I am giving the patient a copy of the CT report to give to Ms. Dewitt Hoes who is her primary provider.  Patient is invited to return to the emergency department if any changes in her condition, problems, or concerns.   Final diagnoses:  Osteoarthritis of cervical spine, unspecified spinal osteoarthritis complication status    ED Discharge Orders    None       Lily Kocher, PA-C 06/16/18 [place], [place], DO 06/18/18 [phone]

## 2018-06-16 NOTE — Discharge Instructions (Addendum)
Your vital signs are within normal limits.  A CT scan of your cervical spine shows degenerative changes at the C3-C4 level, as well as the C5-C6 level.  There is no mass or fluid.  There is no evidence of a hematoma or abscess.  Please share this report with Dr. Ginnie Smart, and the findings can be further discussed with you.  Please return to the emergency department if any emergent changes in your condition, problems, or concerns.

## 2018-06-16 NOTE — ED Triage Notes (Signed)
Pt is having back pain that is extends from her neck to her lumbar area. No bladder incontinence. Chronic back pain. Has had xrays in the last week, but PCP advised her to come here to be seen.

## 2018-06-29 DIAGNOSIS — M5136 Other intervertebral disc degeneration, lumbar region: Secondary | ICD-10-CM | POA: Insufficient documentation

## 2018-07-19 ENCOUNTER — Ambulatory Visit: Payer: Medicaid Other | Attending: Adult Health Nurse Practitioner | Admitting: Physical Therapy

## 2018-07-19 ENCOUNTER — Encounter: Payer: Self-pay | Admitting: Physical Therapy

## 2018-07-19 ENCOUNTER — Other Ambulatory Visit (HOSPITAL_COMMUNITY): Payer: Self-pay | Admitting: Adult Health Nurse Practitioner

## 2018-07-19 ENCOUNTER — Other Ambulatory Visit: Payer: Self-pay | Admitting: Adult Health Nurse Practitioner

## 2018-07-19 ENCOUNTER — Other Ambulatory Visit: Payer: Self-pay

## 2018-07-19 DIAGNOSIS — R262 Difficulty in walking, not elsewhere classified: Secondary | ICD-10-CM | POA: Diagnosis present

## 2018-07-19 DIAGNOSIS — M5136 Other intervertebral disc degeneration, lumbar region: Secondary | ICD-10-CM

## 2018-07-19 DIAGNOSIS — G8929 Other chronic pain: Secondary | ICD-10-CM | POA: Insufficient documentation

## 2018-07-19 DIAGNOSIS — M6281 Muscle weakness (generalized): Secondary | ICD-10-CM | POA: Insufficient documentation

## 2018-07-19 DIAGNOSIS — M5441 Lumbago with sciatica, right side: Secondary | ICD-10-CM | POA: Insufficient documentation

## 2018-07-19 NOTE — Therapy (Addendum)
[facility] [place], [place], [address] Phone: [phone]   Fax:  [phone]  Physical Therapy Evaluation PHYSICAL THERAPY DISCHARGE SUMMARY  Visits from Start of Care: 1  Current functional level related to goals / functional outcomes: See below   Remaining deficits: See goals   Education / Equipment: HEP Plan: Patient agrees to discharge.  Patient goals were not met. Patient is being discharged due to not returning since the last visit.  ?????  Destiny Beck, PT, DPT    Patient Details  Name: Destiny Beck MRN: [id number] Date of Birth: 07-11-1971 Referring Provider (PT): Lars Mage, ANP   Encounter Date: 07/19/2018  PT End of Session - 07/19/18 1446    Visit Number  1    Number of Visits  8    Date for PT Re-Evaluation  08/23/18    Authorization Type  Medicaid;    PT Start Time  0900    PT Stop Time  0946    PT Time Calculation (min)  46 min    Activity Tolerance  Patient limited by pain    Behavior During Therapy  Restless       Past Medical History:  Diagnosis Date  . Abscess   . Anxiety   . Chronic back pain   . Chronic fatigue   . Chronic neck pain   . Depression   . Dizziness   . Headache   . Occipital neuralgia   . Sciatica     Past Surgical History:  Procedure Laterality Date  . CESAREAN SECTION      There were no vitals filed for this visit.   Subjective Assessment - 07/19/18 1440    Subjective  Patient arrives to physical therapy with reports of low back pain that radiates down bilateral LEs R>L that progressively gotten worse 6 months to a year ago. 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 Patient reports pain at worst is 10/10 and pain at best is 3-10 and is relieved by laying on ice and taking Tylenol or Ibuprofen. Patient's goals are to decrease pain, improve movement, improve strength, stand and sit longer, and have less difficulties with walking and home activities.    Patient is accompained by:  Family member   Mother   Limitations  Sitting;Lifting;Standing;Walking;House hold activities    How long can you sit comfortably?  "few minutes"    How long can you stand comfortably?  "few minutes"    How long can you walk comfortably?  "few minutes"    Diagnostic tests  x-ray: disc space narrowing at L5-S1    Patient Stated Goals  "Take more than a few steps a time"    Currently in Pain?  Yes    Pain Score  6     Pain Location  Back    Pain Orientation  Lower    Pain Descriptors / Indicators  Constant;Discomfort;Sharp    Pain Type  Chronic pain    Pain Radiating Towards  right calf    Pain Onset  More than a month ago    Pain Frequency  Constant    Aggravating Factors   "keep pushing myself when pain is felt"  Pain Relieving Factors  "lay on ice"    Effect of Pain on Daily Activities  "everything is limited"         [facility] PT Assessment - 07/19/18 0001      Assessment   Medical Diagnosis  DDD, Lumbar    Referring Provider (PT)  Lars Mage, ANP    Onset Date/Surgical Date  --   January 2018   Next MD Visit  not scheduled    Prior Therapy  no      Precautions   Precautions  None      Restrictions   Weight Bearing Restrictions  No      Balance Screen   Has the patient fallen in the past 6 months  Yes    How many times?  1    Has the patient had a decrease in activity level because of a fear of falling?   Yes    Is the patient reluctant to leave their home because of a fear of falling?   Yes      Home Environment   Living Environment  Private residence    Type of Home  Apartment   handicap accessible apartment     Prior  Function   Level of Independence  Independent with basic ADLs    Vocation  Unemployed   filing for disability     Observation/Other Assessments   Observations  Normal DTR bilaterally      Sensation   Light Touch  Appears Intact      Posture/Postural Control   Posture/Postural Control  Postural limitations    Postural Limitations  Rounded Shoulders;Forward head;Flexed trunk      ROM / Strength   AROM / PROM / Strength  AROM;Strength      AROM   AROM Assessment Site  Lumbar    Lumbar Flexion  finger tips to top of shoes    Lumbar Extension  10 degrees    Lumbar - Right Side Bend  21.5 inches finger tip to floor    Lumbar - Left Side Bend  20.5 inches finger tip to floor      Strength   Overall Strength  Due to pain    Overall Strength Comments  Pain with all MMT    Strength Assessment Site  Hip;Knee    Right/Left Hip  Right;Left    Right Hip Flexion  3+/5    Right Hip Extension  3-/5    Right Hip ABduction  3/5    Left Hip Flexion  3+/5    Left Hip Extension  3-/5    Left Hip External Rotation  3/5    Right/Left Knee  Right;Left    Right Knee Flexion  3/5    Right Knee Extension  3/5    Left Knee Flexion  3/5    Left Knee Extension  3/5      Palpation   Palpation comment  very tender to palpation to bilateral QLs, lumbar paraspinals, upper gluteals and bilateral lateral hips      Special Tests    Special Tests  Lumbar    Lumbar Tests  Slump Test      Slump test   Findings  Positive    Side  Right    Comment  no change/worsening of syptoms after cervical extension desentization      Bed Mobility   Bed Mobility  Left Sidelying to Sit    Left Sidelying to Sit  Contact Guard/Touching assist;Supervision/Verbal cueing  Transfers   Transfers  Sit to Stand;Stand to Sit    Sit to Stand  6: Modified independent (Device/Increase time)    Stand to Sit  6: Modified independent (Device/Increase time)    Comments  very slow during transitional movements with bilateral  UE support to control decent      Ambulation/Gait   Gait Pattern  Step-to pattern;Step-through pattern;Decreased stride length;Decreased hip/knee flexion - left;Decreased hip/knee flexion - right;Antalgic;Decreased trunk rotation                Objective measurements completed on examination: See above findings.              PT Education - 07/19/18 1445    Education Details  transverse abdominis, LTR, SKTC, DKTC    Person(s) Educated  Patient;Parent(s)    Methods  Explanation;Demonstration;Handout    Comprehension  Verbalized understanding       PT Short Term Goals - 07/19/18 1448      PT SHORT TERM GOAL #1   Title  Patient will be independent with initial HEP    Baseline  no knowledge of exercises    Time  3    Period  Weeks   visits   Status  New      PT SHORT TERM GOAL #2   Title  Patient will report ability to perform ADLs with pain less than or equal to 7/10.    Baseline  10/10 with all activities    Time  3    Period  Weeks    Status  New      PT SHORT TERM GOAL #3   Title  Patient will demonstrate proper log rolling technique to protect back during transitional movements.    Baseline  attempts to long sit from supine and required verbal cuing for sequencing    Time  3    Period  Weeks    Status  New        PT Long Term Goals - 07/19/18 1451      PT LONG TERM GOAL #1   Title  Patient will be independent with advanced HEP    Baseline  no knowledge of advanced HEP exercises.    Time  4    Period  Weeks    Status  New      PT LONG TERM GOAL #2   Title  Patient will demonstrate 4/5 LE strength bilaterally to improve stability during functional tasks.     Baseline  3-/5 to 4-/5 in LEs.    Time  4    Period  Weeks    Status  New      PT LONG TERM GOAL #3   Title  Patient will report ability to perform ADLs with low back pain less or equal to 5/10.     Baseline  10/10 with all activities.     Time  4    Period  Weeks    Status  New       PT LONG TERM GOAL #4   Title  Patient will report centralization of neurological symptoms to mid thigh or higher to reduce nerve irritation bilaterally.    Baseline  right calf, left knee    Time  4    Period  Weeks    Status  New             Plan - 07/19/18 1447    Clinical Impression Statement  Patient is a 48 year old female who presents to  physical therapy with low back pain R>L; decrease LE strength bilaterally. Patient noted with normal patellar and Achilles DTR bilaterally. Patient noted with normal sensation bilaterally. Patient very tender to palpation to bilateral QLs, upper gluteals, and bilateral lateral hips. Patient sit<->stand transitions were slow with UE support to control descent. Patient's reported reproduction and increase of radicular symptoms during right slump test; when asked to extend cervical spine to desensitize, patient reported symptoms were the same and worsened as she extended. Patient ambulates with an antalgic gait pattern, decreased stride length, decreased trunk rotation, and decreased gait speed. Patient would benefit from skilled physical therapy to address deficits and address patient's goals.     Clinical Presentation  Evolving    Clinical Presentation due to:  not improving, chronicity of pain    Clinical Decision Making  Low    Rehab Potential  Fair    PT Frequency  2x / week    PT Duration  4 weeks    PT Treatment/Interventions  Electrical Stimulation;ADLs/Self Care Home Management;Iontophoresis 41m/ml Dexamethasone;Cryotherapy;Moist Heat;Traction;Ultrasound;Functional mobility training;Stair training;Gait training;Therapeutic activities;Therapeutic exercise;Balance training;Neuromuscular re-education;Manual techniques;Dry needling;Passive range of motion;Patient/family education;Taping    PT Next Visit Plan  nustep, LE strengthening, modalities PRN for pain relief    PT Home Exercise Plan  see patient education section    Consulted and Agree  with Plan of Care  Patient       Patient will benefit from skilled therapeutic intervention in order to improve the following deficits and impairments:  Pain, Decreased range of motion, Decreased strength, Difficulty walking, Postural dysfunction  Visit Diagnosis: Chronic low back pain with right-sided sciatica, unspecified back pain laterality - Plan: PT plan of care cert/re-cert  Muscle weakness (generalized) - Plan: PT plan of care cert/re-cert  Difficulty in walking, not elsewhere classified - Plan: PT plan of care cert/re-cert     Problem List There are no active problems to display for this patient.  KGabriela Beck PT, DPT 07/19/2018, 2:58 PM  [facility]MRose [address] N[place] [address]Phone: [phone]  Fax:  [phone] Name: DDeanie JupiterMRN: [id number] of Birth: 1December 20, 1972

## 2018-07-21 ENCOUNTER — Ambulatory Visit (HOSPITAL_COMMUNITY)
Admission: RE | Admit: 2018-07-21 | Discharge: 2018-07-21 | Disposition: A | Discharge status: Home / Self Care | Payer: Medicaid Other | Source: Ambulatory Visit | Attending: Adult Health Nurse Practitioner | Admitting: Adult Health Nurse Practitioner

## 2018-07-21 DIAGNOSIS — K8050 Calculus of bile duct without cholangitis or cholecystitis without obstruction: Secondary | ICD-10-CM | POA: Insufficient documentation

## 2018-07-25 ENCOUNTER — Ambulatory Visit (INDEPENDENT_AMBULATORY_CARE_PROVIDER_SITE_OTHER): Payer: Medicaid Other | Admitting: Internal Medicine

## 2018-07-25 ENCOUNTER — Ambulatory Visit (HOSPITAL_COMMUNITY)
Admission: RE | Admit: 2018-07-25 | Discharge: 2018-07-25 | Disposition: A | Discharge status: Home / Self Care | Payer: Medicaid Other | Source: Ambulatory Visit | Attending: Adult Health Nurse Practitioner | Admitting: Adult Health Nurse Practitioner

## 2018-07-25 ENCOUNTER — Encounter (INDEPENDENT_AMBULATORY_CARE_PROVIDER_SITE_OTHER): Payer: Self-pay | Admitting: Internal Medicine

## 2018-07-25 VITALS — BP 136/89 | HR 112 | Temp 98.2°F | Ht 60.0 in | Wt 171.6 lb

## 2018-07-25 DIAGNOSIS — D508 Other iron deficiency anemias: Secondary | ICD-10-CM | POA: Diagnosis not present

## 2018-07-25 DIAGNOSIS — M5136 Other intervertebral disc degeneration, lumbar region: Secondary | ICD-10-CM | POA: Insufficient documentation

## 2018-07-25 NOTE — Progress Notes (Addendum)
Subjective:    Patient ID: Destiny Beck, female    DOB: [birth year], 48 y.o.   MRN: [id number]  HPI Referred by Lars Mage for + stool. She has not seen any blood in her stools. Her appetite is okay. No weight loss. Has a BM daily or every other day. She denies any black stools.  No abdominal pain. Family hx of colon cancer in an paternal aunt x 2 in their 26s.  Her periods are heavy the first 2 days and then menstrual period.   slows down .    Hx of chronic back pain.    Stopped the [facility] powder for headache about a month ago. She was taking the BC's daily x 1.  Take Tylenol EC and 4 Ibuprofen every 6 hrs. Has been taken Ibuprofen for a few years. 06/26/2018 Hemoglobin 8.8 05/28/2013 hemoglobin 12.3.  Review of Systems Past Medical History:  Diagnosis Date  . Abscess   . Anxiety   . Chronic back pain   . Chronic fatigue   . Chronic neck pain   . Depression   . Dizziness   . Headache   . Occipital neuralgia   . Sciatica     Past Surgical History:  Procedure Laterality Date  . CESAREAN SECTION      Allergies  Allergen Reactions  . Percocet [Oxycodone-Acetaminophen] Itching and Nausea Only    Current Outpatient Medications on File Prior to Visit  Medication Sig Dispense Refill  . ALPRAZolam (XANAX) 1 MG tablet Take 1 mg by mouth 3 (three) times daily.    Marland Kitchen buPROPion (WELLBUTRIN XL) 300 MG 24 hr tablet Take 300 mg by mouth daily.    . cyclobenzaprine (FLEXERIL) 10 MG tablet Take 1 tablet (10 mg total) by mouth 3 (three) times daily as needed. (Patient taking differently: Take 10 mg by mouth 3 (three) times daily as needed for muscle spasms. ) 21 tablet 0  . DULoxetine (CYMBALTA) 30 MG capsule Take 30 mg by mouth daily.    Marland Kitchen escitalopram (LEXAPRO) 20 MG tablet Take 20 mg by mouth daily.     . famotidine (PEPCID) 20 MG tablet Take 1 tablet (20 mg total) by mouth 2 (two) times daily. (Patient taking differently: Take 20 mg by mouth daily. ) 30 tablet 0  .  ferrous sulfate 325 (65 FE) MG EC tablet Take 325 mg by mouth 3 (three) times daily with meals.    . gabapentin (NEURONTIN) 400 MG capsule Take 400 mg by mouth 3 (three) times daily.    Marland Kitchen ibuprofen (ADVIL,MOTRIN) 200 MG tablet Take 600-800 mg by mouth every 6 (six) hours as needed.      No current facility-administered medications on file prior to visit.         Objective:   Physical Exam Blood pressure 136/89, pulse (!) 112, temperature 98.2 F (36.8 C), height 5' (1.524 m), weight 171 lb 9.6 oz (77.8 kg). Alert and oriented. Skin warm and dry. Oral mucosa is moist.   . Sclera anicteric, conjunctivae is pink. Thyroid not enlarged. No cervical lymphadenopathy. Lungs clear. Heart regular rate and rhythm.  Abdomen is soft. Bowel sounds are positive. No hepatomegaly. No abdominal masses felt. No tenderness.  No edema to lower extremities.          Assessment & Plan:  Positive stool card. Anemia. Am going to get Iron, Ferritin, H and H Colonoscopy to rule out colonic neoplasm.  EGD to rule ulcer since she has been taken excessive amt of  Ibuprofen. Patient will stop the Motrin.

## 2018-07-25 NOTE — Patient Instructions (Signed)
The risks of bleeding, perforation and infection were reviewed with patient.  

## 2018-07-26 ENCOUNTER — Other Ambulatory Visit (INDEPENDENT_AMBULATORY_CARE_PROVIDER_SITE_OTHER): Payer: Self-pay | Admitting: Internal Medicine

## 2018-07-26 ENCOUNTER — Telehealth (INDEPENDENT_AMBULATORY_CARE_PROVIDER_SITE_OTHER): Payer: Self-pay | Admitting: *Deleted

## 2018-07-26 ENCOUNTER — Encounter (INDEPENDENT_AMBULATORY_CARE_PROVIDER_SITE_OTHER): Payer: Self-pay | Admitting: *Deleted

## 2018-07-26 DIAGNOSIS — D649 Anemia, unspecified: Secondary | ICD-10-CM | POA: Insufficient documentation

## 2018-07-26 DIAGNOSIS — R195 Other fecal abnormalities: Secondary | ICD-10-CM

## 2018-07-26 DIAGNOSIS — D508 Other iron deficiency anemias: Secondary | ICD-10-CM

## 2018-07-26 LAB — IRON, TOTAL/TOTAL IRON BINDING CAP
%SAT: 66 % — AB (ref 16–45)
IRON: 292 ug/dL — AB (ref 40–190)
TIBC: 442 ug/dL (ref 250–450)

## 2018-07-26 LAB — FERRITIN: Ferritin: 21 ng/mL (ref 16–232)

## 2018-07-26 LAB — HEMOGLOBIN AND HEMATOCRIT, BLOOD
HEMATOCRIT: 31.9 % — AB (ref 35.0–45.0)
HEMOGLOBIN: 10.1 g/dL — AB (ref 11.7–15.5)

## 2018-07-26 MED ORDER — SUPREP BOWEL PREP KIT 17.5-3.13-1.6 GM/177ML PO SOLN: 1.0000 | Freq: Once | ORAL | 0 refills | Status: DC

## 2018-07-26 NOTE — Telephone Encounter (Signed)
Patient needs suprep 

## 2018-07-26 NOTE — Patient Instructions (Signed)
Destiny Beck  07/26/2018     @PREFPERIOPPHARMACY @   Your procedure is scheduled on  08/04/2018.  Report to [facility] at  1020   A.M.  Call this number if you have problems the morning of surgery:  [phone]   Remember:  Follow the diet and prep instructions given to you by Dr Olevia Perches office.                      Take these medicines the morning of surgery with A SIP OF WATER  Xanax( if needed), buproprion, flexaril ( if needed), cymbalta, lexapro, pepcid, gabapentin.    Do not wear jewelry, make-up or nail polish.  Do not wear lotions, powders, or perfumes, or deodorant.  Do not shave 48 hours prior to surgery.  Men may shave face and neck.  Do not bring valuables to the hospital.  [facility] is not responsible for any belongings or valuables.  Contacts, dentures or bridgework may not be worn into surgery.  Leave your suitcase in the car.  After surgery it may be brought to your room.  For patients admitted to the hospital, discharge time will be determined by your treatment team.  Patients discharged the day of surgery will not be allowed to drive home.   Name and phone number of your driver:   Family Special instructions:  None  Please read over the following fact sheets that you were given. Anesthesia Post-op Instructions and Care and Recovery After Surgery       Upper Endoscopy, Adult Upper endoscopy is a procedure to look inside the upper GI (gastrointestinal) tract. The upper GI tract is made up of:  The part of the body that moves food from your mouth to your stomach (esophagus).  The stomach.  The first part of your small intestine (duodenum). This procedure is also called esophagogastroduodenoscopy (EGD) or gastroscopy. In this procedure, your health care provider passes a thin, flexible tube (endoscope) through your mouth and down your esophagus into your stomach. A small camera is attached to the end of the tube. Images from  the camera appear on a monitor in the exam room. During this procedure, your health care provider may also remove a small piece of tissue to be sent to a lab and examined under a microscope (biopsy). Your health care provider may do an upper endoscopy to diagnose cancers of the upper GI tract. You may also have this procedure to find the cause of other conditions, such as:  Stomach pain.  Heartburn.  Pain or problems when swallowing.  Nausea and vomiting.  Stomach bleeding.  Stomach ulcers. Tell a health care provider about:  Any allergies you have.  All medicines you are taking, including vitamins, herbs, eye drops, creams, and over-the-counter medicines.  Any problems you or family members have had with anesthetic medicines.  Any blood disorders you have.  Any surgeries you have had.  Any medical conditions you have.  Whether you are pregnant or may be pregnant. What are the risks? Generally, this is a safe procedure. However, problems may occur, including:  Infection.  Bleeding.  Allergic reactions to medicines.  A tear or hole (perforation) in the esophagus, stomach, or duodenum. What happens before the procedure? Staying hydrated Follow instructions from your health care provider about hydration, which may include:  Up to 2 hours before the procedure - you may continue to drink clear  liquids, such as water, clear fruit juice, black coffee, and plain tea.  Eating and drinking restrictions Follow instructions from your health care provider about eating and drinking, which may include:  8 hours before the procedure - stop eating heavy meals or foods, such as meat, fried foods, or fatty foods.  6 hours before the procedure - stop eating light meals or foods, such as toast or cereal.  6 hours before the procedure - stop drinking milk or drinks that contain milk.  2 hours before the procedure - stop drinking clear liquids. Medicines Ask your health care provider  about:  Changing or stopping your regular medicines. This is especially important if you are taking diabetes medicines or blood thinners.  Taking medicines such as aspirin and ibuprofen. These medicines can thin your blood. Do not take these medicines unless your health care provider tells you to take them.  Taking over-the-counter medicines, vitamins, herbs, and supplements. General instructions  Plan to have someone take you home from the hospital or clinic.  If you will be going home right after the procedure, plan to have someone with you for 24 hours.  Ask your health care provider what steps will be taken to help prevent infection. What happens during the procedure?   An IV will be inserted into one of your veins.  You may be given one or more of the following: ? A medicine to help you relax (sedative). ? A medicine to numb the throat (local anesthetic).  You will lie on your left side on an exam table.  Your health care provider will pass the endoscope through your mouth and down your esophagus.  Your health care provider will use the scope to check the inside of your esophagus, stomach, and duodenum. Biopsies may be taken.  The endoscope will be removed. The procedure may vary among health care providers and hospitals. What happens after the procedure?  Your blood pressure, heart rate, breathing rate, and blood oxygen level will be monitored until you leave the hospital or clinic.  Do not drive for 24 hours if you were given a sedative during your procedure.  When your throat is no longer numb, you may be given some fluids to drink.  It is up to you to get the results of your procedure. Ask your health care provider, or the department that is doing the procedure, when your results will be ready. Summary  Upper endoscopy is a procedure to look inside the upper GI tract.  During the procedure, an IV will be inserted into one of your veins. You may be given a medicine  to help you relax.  A medicine will be used to numb your throat.  The endoscope will be passed through your mouth and down your esophagus. This information is not intended to replace advice given to you by your health care provider. Make sure you discuss any questions you have with your health care provider. Document Released: 06/25/2000 Document Revised: 11/28/2017 Document Reviewed: 11/28/2017 Elsevier Interactive Patient Education  2019 [place] Endoscopy, Adult, Care After This sheet gives you information about how to care for yourself after your procedure. Your health care provider may also give you more specific instructions. If you have problems or questions, contact your health care provider. What can I expect after the procedure? After the procedure, it is common to have:  A sore throat.  Mild stomach pain or discomfort.  Bloating.  Nausea. Follow these instructions at home:  Follow instructions from your health care provider about what to eat or drink after your procedure.  Return to your normal activities as told by your health care provider. Ask your health care provider what activities are safe for you.  Take over-the-counter and prescription medicines only as told by your health care provider.  Do not drive for 24 hours if you were given a sedative during your procedure.  Keep all follow-up visits as told by your health care provider. This is important. Contact a health care provider if you have:  A sore throat that lasts longer than one day.  Trouble swallowing. Get help right away if:  You vomit blood or your vomit looks like coffee grounds.  You have: ? A fever. ? Bloody, black, or tarry stools. ? A severe sore throat or you cannot swallow. ? Difficulty breathing. ? Severe pain in your chest or abdomen. Summary  After the procedure, it is common to have a sore throat, mild stomach discomfort, bloating, and nausea.  Do not drive for 24  hours if you were given a sedative during the procedure.  Follow instructions from your health care provider about what to eat or drink after your procedure.  Return to your normal activities as told by your health care provider. This information is not intended to replace advice given to you by your health care provider. Make sure you discuss any questions you have with your health care provider. Document Released: 12/28/2011 Document Revised: 11/28/2017 Document Reviewed: 11/28/2017 Elsevier Interactive Patient Education  2019 [employer].  Colonoscopy, Adult A colonoscopy is an exam to look at the large intestine. It is done to check for problems, such as:  Lumps (tumors).  Growths (polyps).  Swelling (inflammation).  Bleeding. What happens before the procedure? Eating and drinking Follow instructions from your doctor about eating and drinking. These instructions may include:  A few days before the procedure - follow a low-fiber diet. ? Avoid nuts. ? Avoid seeds. ? Avoid dried fruit. ? Avoid raw fruits. ? Avoid vegetables.  1-3 days before the procedure - follow a clear liquid diet. Avoid liquids that have red or purple dye. Drink only clear liquids, such as: ? Clear broth or bouillon. ? Black coffee or tea. ? Clear juice. ? Clear soft drinks or sports drinks. ? Gelatin dessert. ? Popsicles.  On the day of the procedure - do not eat or drink anything during the 2 hours before the procedure. Up to 2 hours before the procedure, you may continue to drink clear liquids, such as water or clear fruit juice.  Bowel prep If you were prescribed an oral bowel prep:  Take it as told by your doctor. Starting the day before your procedure, you will need to drink a lot of liquid. The liquid will cause you to poop (have bowel movements) until your poop is almost clear or light green.  To clean out your colon, you may also be given: ? Laxative medicines. ? Instructions about how  to use an enema.  If your skin or butt gets irritated from diarrhea, you may: ? Wipe the area with wipes that have medicine in them, such as adult wet wipes with aloe and vitamin E. ? Put something on your skin that soothes the area, such as petroleum jelly.  If you throw up (vomit) while drinking the bowel prep, take a break for up to 60 minutes. Then begin the bowel prep again. If you keep throwing up and you cannot take  the bowel prep without throwing up, call your doctor. General instructions  Ask your doctor about: ? Changing or stopping your normal medicines. This is important if you take iron pills, diabetes medicines, or blood thinners. ? Taking medicines such as aspirin and ibuprofen. These medicines can thin your blood. Do not take these medicines unless your doctor tells you to take them.  Plan to have someone take you home from the hospital or clinic. What happens during the procedure?   An IV tube may be put into one of your veins.  You will be given medicine to help you relax (sedative).  To reduce your risk of infection: ? Your doctors will wash their hands. ? Your anal area will be washed with soap.  You will be asked to lie on your side with your knees bent.  Your doctor will get a long, thin, flexible tube ready. The tube will have a camera and a light on the end.  The tube will be put into your anus.  The tube will be gently put into your large intestine.  Air will be delivered into your large intestine to keep it open. You may feel some pressure or cramping.  The camera will be used to take photos.  A small tissue sample may be removed for testing (biopsy).  If small growths are found, your doctor may remove them and have them checked for cancer.  The tube that was put into your anus will be slowly removed. The procedure may vary among doctors and hospitals. What happens after the procedure?  Your doctor will check on you often until the medicines you  were given have worn off.  Do not drive for 24 hours after the procedure.  You may have a small amount of blood in your poop.  You may pass gas.  You may have mild cramps or bloating in your belly (abdomen).  It is up to you to get the results of your procedure. Ask your doctor, or the department performing the procedure, when your results will be ready. Summary  A colonoscopy is an exam to look at the large intestine.  Follow instructions from your doctor about eating and drinking before the procedure.  If you were prescribed an oral bowel prep to clean out your colon, take it as told by your doctor.  Your doctor will check on you often until the medicines you were given have worn off.  Plan to have someone take you home from the hospital or clinic. This information is not intended to replace advice given to you by your health care provider. Make sure you discuss any questions you have with your health care provider. Document Released: 07/31/2010 Document Revised: 04/27/2017 Document Reviewed: 09/09/2015 Elsevier Interactive Patient Education  2019 [employer]  Colonoscopy, Adult, Care After This sheet gives you information about how to care for yourself after your procedure. Your health care provider may also give you more specific instructions. If you have problems or questions, contact your health care provider. What can I expect after the procedure? After the procedure, it is common to have:  A small amount of blood in your stool for 24 hours after the procedure.  Some gas.  Mild abdominal cramping or bloating. Follow these instructions at home: General instructions  For the first 24 hours after the procedure: ? Do not drive or use machinery. ? Do not sign important documents. ? Do not drink alcohol. ? Do your regular daily activities at a slower pace  than normal. ? Eat soft, easy-to-digest foods.  Take over-the-counter or prescription medicines only as told by  your health care provider. Relieving cramping and bloating   Try walking around when you have cramps or feel bloated.  Apply heat to your abdomen as told by your health care provider. Use a heat source that your health care provider recommends, such as a moist heat pack or a heating pad. ? Place a towel between your skin and the heat source. ? Leave the heat on for 20-30 minutes. ? Remove the heat if your skin turns bright red. This is especially important if you are unable to feel pain, heat, or cold. You may have a greater risk of getting burned. Eating and drinking   Drink enough fluid to keep your urine pale yellow.  Resume your normal diet as instructed by your health care provider. Avoid heavy or fried foods that are hard to digest.  Avoid drinking alcohol for as long as instructed by your health care provider. Contact a health care provider if:  You have blood in your stool 2-3 days after the procedure. Get help right away if:  You have more than a small spotting of blood in your stool.  You pass large blood clots in your stool.  Your abdomen is swollen.  You have nausea or vomiting.  You have a fever.  You have increasing abdominal pain that is not relieved with medicine. Summary  After the procedure, it is common to have a small amount of blood in your stool. You may also have mild abdominal cramping and bloating.  For the first 24 hours after the procedure, do not drive or use machinery, sign important documents, or drink alcohol.  Contact your health care provider if you have a lot of blood in your stool, nausea or vomiting, a fever, or increased abdominal pain. This information is not intended to replace advice given to you by your health care provider. Make sure you discuss any questions you have with your health care provider. Document Released: 02/10/2004 Document Revised: 04/20/2017 Document Reviewed: 09/09/2015 Elsevier Interactive Patient Education  2019  [place] Anesthesia is a term that refers to techniques, procedures, and medicines that help a person stay safe and comfortable during a medical procedure. Monitored anesthesia care, or sedation, is one type of anesthesia. Your anesthesia specialist may recommend sedation if you will be having a procedure that does not require you to be unconscious, such as:  Cataract surgery.  A dental procedure.  A biopsy.  A colonoscopy. During the procedure, you may receive a medicine to help you relax (sedative). There are three levels of sedation:  Mild sedation. At this level, you may feel awake and relaxed. You will be able to follow directions.  Moderate sedation. At this level, you will be sleepy. You may not remember the procedure.  Deep sedation. At this level, you will be asleep. You will not remember the procedure. The more medicine you are given, the deeper your level of sedation will be. Depending on how you respond to the procedure, the anesthesia specialist may change your level of sedation or the type of anesthesia to fit your needs. An anesthesia specialist will monitor you closely during the procedure. Let your health care provider know about:  Any allergies you have.  All medicines you are taking, including vitamins, herbs, eye drops, creams, and over-the-counter medicines.  Any use of steroids (by mouth or as a cream).  Any problems  you or family members have had with sedatives and anesthetic medicines.  Any blood disorders you have.  Any surgeries you have had.  Any medical conditions you have, such as sleep apnea.  Whether you are pregnant or may be pregnant.  Any use of cigarettes, alcohol, or street drugs. What are the risks? Generally, this is a safe procedure. However, problems may occur, including:  Getting too much medicine (oversedation).  Nausea.  Allergic reaction to medicines.  Trouble breathing. If this happens, a  breathing tube may be used to help with breathing. It will be removed when you are awake and breathing on your own.  Heart trouble.  Lung trouble. Before the procedure Staying hydrated Follow instructions from your health care provider about hydration, which may include:  Up to 2 hours before the procedure - you may continue to drink clear liquids, such as water, clear fruit juice, black coffee, and plain tea. Eating and drinking restrictions Follow instructions from your health care provider about eating and drinking, which may include:  8 hours before the procedure - stop eating heavy meals or foods such as meat, fried foods, or fatty foods.  6 hours before the procedure - stop eating light meals or foods, such as toast or cereal.  6 hours before the procedure - stop drinking milk or drinks that contain milk.  2 hours before the procedure - stop drinking clear liquids. Medicines Ask your health care provider about:  Changing or stopping your regular medicines. This is especially important if you are taking diabetes medicines or blood thinners.  Taking medicines such as aspirin and ibuprofen. These medicines can thin your blood. Do not take these medicines before your procedure if your health care provider instructs you not to. Tests and exams  You will have a physical exam.  You may have blood tests done to show: ? How well your kidneys and liver are working. ? How well your blood can clot. General instructions  Plan to have someone take you home from the hospital or clinic.  If you will be going home right after the procedure, plan to have someone with you for 24 hours.  What happens during the procedure?  Your blood pressure, heart rate, breathing, level of pain and overall condition will be monitored.  An IV tube will be inserted into one of your veins.  Your anesthesia specialist will give you medicines as needed to keep you comfortable during the procedure. This  may mean changing the level of sedation.  The procedure will be performed. After the procedure  Your blood pressure, heart rate, breathing rate, and blood oxygen level will be monitored until the medicines you were given have worn off.  Do not drive for 24 hours if you received a sedative.  You may: ? Feel sleepy, clumsy, or nauseous. ? Feel forgetful about what happened after the procedure. ? Have a sore throat if you had a breathing tube during the procedure. ? Vomit. This information is not intended to replace advice given to you by your health care provider. Make sure you discuss any questions you have with your health care provider. Document Released: 03/24/2005 Document Revised: 12/05/2015 Document Reviewed: 10/19/2015 Elsevier Interactive Patient Education  2019 [place], Care After These instructions provide you with information about caring for yourself after your procedure. Your health care provider may also give you more specific instructions. Your treatment has been planned according to current medical practices, but problems sometimes occur. Call your  health care provider if you have any problems or questions after your procedure. What can I expect after the procedure? After your procedure, you may:  Feel sleepy for several hours.  Feel clumsy and have poor balance for several hours.  Feel forgetful about what happened after the procedure.  Have poor judgment for several hours.  Feel nauseous or vomit.  Have a sore throat if you had a breathing tube during the procedure. Follow these instructions at home: For at least 24 hours after the procedure:      Have a responsible adult stay with you. It is important to have someone help care for you until you are awake and alert.  Rest as needed.  Do not: ? Participate in activities in which you could fall or become injured. ? Drive. ? Use heavy machinery. ? Drink alcohol. ? Take  sleeping pills or medicines that cause drowsiness. ? Make important decisions or sign legal documents. ? Take care of children on your own. Eating and drinking  Follow the diet that is recommended by your health care provider.  If you vomit, drink water, juice, or soup when you can drink without vomiting.  Make sure you have little or no nausea before eating solid foods. General instructions  Take over-the-counter and prescription medicines only as told by your health care provider.  If you have sleep apnea, surgery and certain medicines can increase your risk for breathing problems. Follow instructions from your health care provider about wearing your sleep device: ? Anytime you are sleeping, including during daytime naps. ? While taking prescription pain medicines, sleeping medicines, or medicines that make you drowsy.  If you smoke, do not smoke without supervision.  Keep all follow-up visits as told by your health care provider. This is important. Contact a health care provider if:  You keep feeling nauseous or you keep vomiting.  You feel light-headed.  You develop a rash.  You have a fever. Get help right away if:  You have trouble breathing. Summary  For several hours after your procedure, you may feel sleepy and have poor judgment.  Have a responsible adult stay with you for at least 24 hours or until you are awake and alert. This information is not intended to replace advice given to you by your health care provider. Make sure you discuss any questions you have with your health care provider. Document Released: 10/19/2015 Document Revised: 02/11/2017 Document Reviewed: 10/19/2015 Elsevier Interactive Patient Education  2019 [employer].

## 2018-08-02 ENCOUNTER — Other Ambulatory Visit: Payer: Self-pay

## 2018-08-02 ENCOUNTER — Encounter (HOSPITAL_COMMUNITY): Payer: Self-pay

## 2018-08-02 ENCOUNTER — Encounter (HOSPITAL_COMMUNITY)
Admission: RE | Admit: 2018-08-02 | Discharge: 2018-08-02 | Disposition: A | Discharge status: Home / Self Care | Payer: Medicaid Other | Source: Ambulatory Visit | Attending: Internal Medicine | Admitting: Internal Medicine

## 2018-08-02 DIAGNOSIS — Z01812 Encounter for preprocedural laboratory examination: Secondary | ICD-10-CM | POA: Insufficient documentation

## 2018-08-02 DIAGNOSIS — D508 Other iron deficiency anemias: Secondary | ICD-10-CM

## 2018-08-02 DIAGNOSIS — R195 Other fecal abnormalities: Secondary | ICD-10-CM

## 2018-08-02 LAB — HCG, SERUM, QUALITATIVE: Preg, Serum: NEGATIVE

## 2018-08-04 ENCOUNTER — Ambulatory Visit (HOSPITAL_COMMUNITY): Payer: Medicaid Other | Admitting: Anesthesiology

## 2018-08-04 ENCOUNTER — Encounter (HOSPITAL_COMMUNITY)
Admission: RE | Disposition: A | Discharge status: Home / Self Care | Payer: Self-pay | Source: Home / Self Care | Attending: Internal Medicine

## 2018-08-04 ENCOUNTER — Encounter (HOSPITAL_COMMUNITY): Payer: Self-pay | Admitting: Anesthesiology

## 2018-08-04 ENCOUNTER — Ambulatory Visit (HOSPITAL_COMMUNITY)
Admission: RE | Admit: 2018-08-04 | Discharge: 2018-08-04 | Disposition: A | Discharge status: Home / Self Care | Payer: Medicaid Other | Attending: Internal Medicine | Admitting: Internal Medicine

## 2018-08-04 DIAGNOSIS — K210 Gastro-esophageal reflux disease with esophagitis: Secondary | ICD-10-CM | POA: Diagnosis not present

## 2018-08-04 DIAGNOSIS — F419 Anxiety disorder, unspecified: Secondary | ICD-10-CM | POA: Insufficient documentation

## 2018-08-04 DIAGNOSIS — D649 Anemia, unspecified: Secondary | ICD-10-CM

## 2018-08-04 DIAGNOSIS — Z79899 Other long term (current) drug therapy: Secondary | ICD-10-CM | POA: Insufficient documentation

## 2018-08-04 DIAGNOSIS — D508 Other iron deficiency anemias: Secondary | ICD-10-CM

## 2018-08-04 DIAGNOSIS — Z885 Allergy status to narcotic agent status: Secondary | ICD-10-CM | POA: Insufficient documentation

## 2018-08-04 DIAGNOSIS — K2950 Unspecified chronic gastritis without bleeding: Secondary | ICD-10-CM | POA: Diagnosis not present

## 2018-08-04 DIAGNOSIS — F329 Major depressive disorder, single episode, unspecified: Secondary | ICD-10-CM | POA: Diagnosis not present

## 2018-08-04 DIAGNOSIS — Z87891 Personal history of nicotine dependence: Secondary | ICD-10-CM | POA: Diagnosis not present

## 2018-08-04 DIAGNOSIS — D500 Iron deficiency anemia secondary to blood loss (chronic): Secondary | ICD-10-CM | POA: Insufficient documentation

## 2018-08-04 DIAGNOSIS — Z800 Family history of malignant neoplasm of digestive organs: Secondary | ICD-10-CM | POA: Diagnosis not present

## 2018-08-04 DIAGNOSIS — R195 Other fecal abnormalities: Secondary | ICD-10-CM | POA: Insufficient documentation

## 2018-08-04 DIAGNOSIS — K644 Residual hemorrhoidal skin tags: Secondary | ICD-10-CM | POA: Insufficient documentation

## 2018-08-04 DIAGNOSIS — K3189 Other diseases of stomach and duodenum: Secondary | ICD-10-CM

## 2018-08-04 DIAGNOSIS — K449 Diaphragmatic hernia without obstruction or gangrene: Secondary | ICD-10-CM | POA: Diagnosis not present

## 2018-08-04 DIAGNOSIS — K2970 Gastritis, unspecified, without bleeding: Secondary | ICD-10-CM

## 2018-08-04 HISTORY — PX: ESOPHAGOGASTRODUODENOSCOPY (EGD) WITH PROPOFOL: SHX5813

## 2018-08-04 HISTORY — PX: BIOPSY: SHX5522

## 2018-08-04 HISTORY — PX: COLONOSCOPY WITH PROPOFOL: SHX5780

## 2018-08-04 SURGERY — COLONOSCOPY WITH PROPOFOL
Anesthesia: General

## 2018-08-04 MED ORDER — PROPOFOL 500 MG/50ML IV EMUL
INTRAVENOUS | Status: DC | PRN
  Administered 2018-08-04: 11:00:00 via INTRAVENOUS
  Administered 2018-08-04: 150 ug/kg/min via INTRAVENOUS

## 2018-08-04 MED ORDER — KETAMINE HCL 50 MG/5ML IJ SOSY
PREFILLED_SYRINGE | INTRAMUSCULAR | Status: AC
  Filled 2018-08-04: qty 5

## 2018-08-04 MED ORDER — CHLORHEXIDINE GLUCONATE CLOTH 2 % EX PADS: 6.0000 | MEDICATED_PAD | Freq: Once | CUTANEOUS | Status: DC

## 2018-08-04 MED ORDER — KETAMINE HCL 10 MG/ML IJ SOLN
INTRAMUSCULAR | Status: DC | PRN
  Administered 2018-08-04: 10 mg via INTRAVENOUS

## 2018-08-04 MED ORDER — PROPOFOL 10 MG/ML IV BOLUS
INTRAVENOUS | Status: AC
  Filled 2018-08-04: qty 40

## 2018-08-04 MED ORDER — FAMOTIDINE 20 MG PO TABS: 20.0000 mg | ORAL_TABLET | Freq: Two times a day (BID) | ORAL | 5 refills | Status: DC

## 2018-08-04 MED ORDER — LACTATED RINGERS IV SOLN
INTRAVENOUS | Status: DC
  Administered 2018-08-04: 11:00:00 via INTRAVENOUS

## 2018-08-04 NOTE — H&P (Signed)
Destiny Beck is an 48 y.o. female.   Chief Complaint: Patient is here for EGD and colonoscopy. HPI: Patient is 48 year old Caucasian female who was recently found to have anemia.  Her stool was guaiac positive.  Serum ferritin was low at 11.  Serum iron reported to be high but felt to be lab error.  Patient denies melena or rectal bleeding.  She has had intermittent heartburn and burning epigastric pain.  She had been taking ibuprofen as much as 3200 mg daily but this was stopped after her visit to our office.  She has back pain.  She has undergone an MRI and found to have a disc disease as well as arthritis and is scheduled to see back specialist at [place] in near future.  She states her periods have always been heavy. Family history significant for colon carcinoma and 2 paternal aunts.  They were in their 82s and died within few months or few years of diagnosis because of metastatic disease She says colon cancer also runs on mother side of the family but she does not have complete information.  Past Medical History:  Diagnosis Date  . Abscess   . Anxiety   . Chronic back pain   . Chronic fatigue   . Chronic neck pain   . Depression   . Dizziness   . Headache   . Occipital neuralgia   . Sciatica     Past Surgical History:  Procedure Laterality Date  . CESAREAN SECTION     x2  . TUBAL LIGATION      History reviewed. No pertinent family history. Social History:  reports that she quit smoking about 18 months ago. Her smoking use included cigarettes. She has a 15.00 pack-year smoking history. She has never used smokeless tobacco. She reports current alcohol use. She reports current drug use. Drug: Marijuana.  Allergies:  Allergies  Allergen Reactions  . Percocet [Oxycodone-Acetaminophen] Itching and Nausea Only    Medications Prior to Admission  Medication Sig Dispense Refill  . acetaminophen (TYLENOL) 500 MG tablet Take 1,000 mg by mouth every 6 (six) hours as needed (pain).     Marland Kitchen ALPRAZolam (XANAX) 1 MG tablet Take 1 mg by mouth 2 (two) times daily.     Marland Kitchen buPROPion (WELLBUTRIN XL) 300 MG 24 hr tablet Take 300 mg by mouth daily.    . cyclobenzaprine (FLEXERIL) 10 MG tablet Take 1 tablet (10 mg total) by mouth 3 (three) times daily as needed. (Patient taking differently: Take 10 mg by mouth 2 (two) times daily. ) 21 tablet 0  . DULoxetine (CYMBALTA) 30 MG capsule Take 30 mg by mouth 2 (two) times daily.     Marland Kitchen escitalopram (LEXAPRO) 20 MG tablet Take 20 mg by mouth daily.     . famotidine (PEPCID) 20 MG tablet Take 1 tablet (20 mg total) by mouth 2 (two) times daily. (Patient taking differently: Take 20 mg by mouth daily. ) 30 tablet 0  . ferrous sulfate 325 (65 FE) MG EC tablet Take 325 mg by mouth daily with breakfast.     . gabapentin (NEURONTIN) 400 MG capsule Take 400 mg by mouth 3 (three) times daily.    . Multiple Vitamins-Minerals (MULTIVITAMIN WITH MINERALS) tablet Take 1 tablet by mouth daily.      No results found for this or any previous visit (from the past 48 hour(s)). No results found.  ROS  Blood pressure (!) 135/91, pulse 99, temperature 98.2 F (36.8 C), temperature source Oral, resp.  rate 18, last menstrual period 07/12/2018, SpO2 95 %. Physical Exam  Constitutional: She appears well-developed and well-nourished.  HENT:  Mouth/Throat: Oropharynx is clear and moist.  Eyes: Conjunctivae are normal. No scleral icterus.  Neck: No thyromegaly present.  Cardiovascular: Normal rate, regular rhythm and normal heart sounds.  No murmur heard. Respiratory: Effort normal and breath sounds normal.  GI:  Abdomen is symmetrical.  It is soft.  She has mild periumbilical tenderness.  No organomegaly or masses.  Lymphadenopathy:    She has no cervical adenopathy.  Neurological: She is alert.  Skin: Skin is warm and dry.     Assessment/Plan Iron deficiency anemia and heme positive stool. History of NSAID use. Family history of CRC in 2 paternal  aunts. Diagnostic EGD and colonoscopy.  Hildred Laser, MD 08/04/2018, 11:06 AM

## 2018-08-04 NOTE — Anesthesia Preprocedure Evaluation (Signed)
Anesthesia Evaluation  Patient identified by MRN, date of birth, ID band Patient awake    Reviewed: Allergy & Precautions, H&P , NPO status , Patient's Chart, lab work & pertinent test results, reviewed documented beta blocker date and time   Airway Mallampati: II  TM Distance: >3 FB Neck ROM: full    Dental no notable dental hx.    Pulmonary neg pulmonary ROS, former smoker,    Pulmonary exam normal breath sounds clear to auscultation       Cardiovascular Exercise Tolerance: Good negative cardio ROS   Rhythm:regular Rate:Normal     Neuro/Psych  Headaches, PSYCHIATRIC DISORDERS Anxiety Depression  Neuromuscular disease    GI/Hepatic negative GI ROS, Neg liver ROS,   Endo/Other  negative endocrine ROS  Renal/GU negative Renal ROS  negative genitourinary   Musculoskeletal   Abdominal   Peds  Hematology  (+) Blood dyscrasia, anemia ,   Anesthesia Other Findings   Reproductive/Obstetrics negative OB ROS                             Anesthesia Physical Anesthesia Plan  ASA: II  Anesthesia Plan: General   Post-op Pain Management:    Induction:   PONV Risk Score and Plan:   Airway Management Planned:   Additional Equipment:   Intra-op Plan:   Post-operative Plan:   Informed Consent: I have reviewed the patients History and Physical, chart, labs and discussed the procedure including the risks, benefits and alternatives for the proposed anesthesia with the patient or authorized representative who has indicated his/her understanding and acceptance.     Dental Advisory Given  Plan Discussed with: CRNA  Anesthesia Plan Comments:         Anesthesia Quick Evaluation

## 2018-08-04 NOTE — Discharge Instructions (Signed)
No aspirin or NSAIDs for 24 hours. Resume usual medications as before.  Remember to take famotidine twice a day instead of once a day. Resume usual diet. No driving for 24 hours. Physician will call with biopsy results. H&H in 1 month.  Office will call.

## 2018-08-04 NOTE — Transfer of Care (Signed)
Immediate Anesthesia Transfer of Care Note  Patient: Destiny Beck  Procedure(s) Performed: COLONOSCOPY WITH PROPOFOL (N/A ) ESOPHAGOGASTRODUODENOSCOPY (EGD) WITH PROPOFOL (N/A ) BIOPSY  Patient Location: PACU  Anesthesia Type:General  Level of Consciousness: awake and patient cooperative  Airway & Oxygen Therapy: Patient Spontanous Breathing and Patient connected to nasal cannula oxygen  Post-op Assessment: Report given to RN, Post -op Vital signs reviewed and stable and Patient moving all extremities  Post vital signs: Reviewed and stable  Last Vitals:  Vitals Value Taken Time  BP    Temp    Pulse    Resp    SpO2      Last Pain:  Vitals:   08/04/18 1112  TempSrc:   PainSc: 0-No pain      Patients Stated Pain Goal: 5 (67/20/94 [id number])  Complications: No apparent anesthesia complications

## 2018-08-04 NOTE — Op Note (Signed)
[facility] Patient Name: Destiny Beck Procedure Date: 08/04/2018 10:49 AM MRN: [id number] Date of Birth: 08-Oct-1970 Attending MD: Hildred Laser , MD CSN: [id number] Age: 48 Admit Type: Outpatient Procedure:                Colonoscopy Indications:              Iron deficiency anemia secondary to chronic blood                            loss Providers:                Hildred Laser, MD, Otis Peak B. Sharon Seller, RN, Gerome Sam, RN, Randa Spike, Technician Referring MD:             Lars Mage, NP Medicines:                Propofol per Anesthesia Complications:            No immediate complications. Estimated Blood Loss:     Estimated blood loss: none. Procedure:                Pre-Anesthesia Assessment:                           - Prior to the procedure, a History and Physical                            was performed, and patient medications and                            allergies were reviewed. The patient's tolerance of                            previous anesthesia was also reviewed. The risks                            and benefits of the procedure and the sedation                            options and risks were discussed with the patient.                            All questions were answered, and informed consent                            was obtained. Prior Anticoagulants: The patient                            last took ibuprofen 14 days prior to the procedure.                            ASA Grade Assessment: II - A patient with mild  systemic disease. After reviewing the risks and                            benefits, the patient was deemed in satisfactory                            condition to undergo the procedure.                           - Prior to the procedure, a History and Physical                            was performed, and patient medications and                            allergies were  reviewed. The patient's tolerance of                            previous anesthesia was also reviewed. The risks                            and benefits of the procedure and the sedation                            options and risks were discussed with the patient.                            All questions were answered, and informed consent                            was obtained. Prior Anticoagulants: The patient                            last took ibuprofen 14 days prior to the procedure.                            ASA Grade Assessment: II - A patient with mild                            systemic disease. After reviewing the risks and                            benefits, the patient was deemed in satisfactory                            condition to undergo the procedure.                           After obtaining informed consent, the colonoscope                            was passed under direct vision. Throughout the  procedure, the patient's blood pressure, pulse, and                            oxygen saturations were monitored continuously. The                            [id number]-[id number] ([id number]) scope was introduced through                            the anus and advanced to the the cecum, identified                            by appendiceal orifice and ileocecal valve. The                            colonoscopy was performed without difficulty. The                            patient tolerated the procedure well. The quality                            of the bowel preparation was excellent. The                            ileocecal valve, appendiceal orifice, and rectum                            were photographed. Scope In: 11:30:47 AM Scope Out: 11:45:35 AM Scope Withdrawal Time: 0 hours 10 minutes 31 seconds  Total Procedure Duration: 0 hours 14 minutes 48 seconds  Findings:      The perianal and digital rectal examinations were normal.      The colon (entire  examined portion) appeared normal.      External hemorrhoids were found during retroflexion. The hemorrhoids       were small. Impression:               - The entire examined colon is normal.                           - External hemorrhoids.                           - No specimens collected. Moderate Sedation:      Per Anesthesia Care Recommendation:           - Patient has a contact number available for                            emergencies. The signs and symptoms of potential                            delayed complications were discussed with the                            patient. Return to normal activities tomorrow.  Written discharge instructions were provided to the                            patient.                           - Resume previous diet today.                           - Continue present medications.                           - H/H in one month.                           - Repeat colonoscopy in 5 years for screening                            purposes. Procedure Code(s):        --- Professional ---                           [phone], Colonoscopy, flexible; diagnostic, including                            collection of specimen(s) by brushing or washing,                            when performed (separate procedure) Diagnosis Code(s):        --- Professional ---                           K64.4, Residual hemorrhoidal skin tags                           D50.0, Iron deficiency anemia secondary to blood                            loss (chronic) CPT copyright 2018 American [employer]. All rights reserved. The codes documented in this report are preliminary and upon coder review may  be revised to meet current compliance requirements. Hildred Laser, MD Hildred Laser, MD 08/04/2018 12:01:39 PM This report has been signed electronically. Number of Addenda: 0

## 2018-08-04 NOTE — Op Note (Signed)
[facility] Patient Name: Destiny Beck Procedure Date: 08/04/2018 10:48 AM MRN: [id number] Date of Birth: 11-Apr-1971 Attending MD: Hildred Laser , MD CSN: [id number] Age: 48 Admit Type: Outpatient Procedure:                Upper GI endoscopy Indications:              Iron deficiency anemia secondary to chronic blood                            loss and NSAID use Providers:                Hildred Laser, MD, Otis Peak B. Sharon Seller, RN, Gerome Sam, RN, Randa Spike, Technician Referring MD:             Lars Mage, NP Medicines:                Propofol per Anesthesia Complications:            No immediate complications. Estimated Blood Loss:     Estimated blood loss was minimal. Procedure:                Pre-Anesthesia Assessment:                           - Prior to the procedure, a History and Physical                            was performed, and patient medications and                            allergies were reviewed. The patient's tolerance of                            previous anesthesia was also reviewed. The risks                            and benefits of the procedure and the sedation                            options and risks were discussed with the patient.                            All questions were answered, and informed consent                            was obtained. Prior Anticoagulants: The patient                            last took ibuprofen 14 days prior to the procedure.                            ASA Grade Assessment: II - A patient with mild  systemic disease. After reviewing the risks and                            benefits, the patient was deemed in satisfactory                            condition to undergo the procedure.                           After obtaining informed consent, the endoscope was                            passed under direct vision. Throughout the              procedure, the patient's blood pressure, pulse, and                            oxygen saturations were monitored continuously. The                            GIF-H190 ([id number]) was introduced through the and                            advanced to the second part of duodenum. The upper                            GI endoscopy was accomplished without difficulty.                            The patient tolerated the procedure well. Scope In: 11:18:52 AM Scope Out: 11:26:43 AM Total Procedure Duration: 0 hours 7 minutes 51 seconds  Findings:      The proximal esophagus, mid esophagus and distal esophagus were normal.      LA Grade A (one or more mucosal breaks less than 5 mm, not extending       between tops of 2 mucosal folds) esophagitis was found 35 cm from the       incisors.      A 2 cm hiatal hernia was present.      A healed ulcer was found in the prepyloric region of the stomach.      Patchy mild inflammation characterized by congestion (edema) and       erythema was found in the gastric antrum. Biopsies were taken with a       cold forceps for histology.      The exam of the stomach was otherwise normal.      The duodenal bulb and second portion of the duodenum were normal. Impression:               - Normal proximal esophagus, mid esophagus and                            distal esophagus.                           - LA Grade A reflux esophagitis.                           -  2 cm hiatal hernia.                           - Scar in the prepyloric region of the stomach.                           - Gastritis. Biopsied.                           - Normal duodenal bulb and second portion of the                            duodenum. Moderate Sedation:      Per Anesthesia Care Recommendation:           - Resume previous diet today.                           - Continue present medications including iron pills.                           - No aspirin, ibuprofen, naproxen, or other                             non-steroidal anti-inflammatory drugs.                           - Await pathology results.                           - See the other procedure note for documentation of                            additional recommendations. Procedure Code(s):        --- Professional ---                           [phone], Esophagogastroduodenoscopy, flexible,                            transoral; with biopsy, single or multiple Diagnosis Code(s):        --- Professional ---                           K21.0, Gastro-esophageal reflux disease with                            esophagitis                           K44.9, Diaphragmatic hernia without obstruction or                            gangrene                           K31.89, Other diseases of stomach and duodenum  K29.70, Gastritis, unspecified, without bleeding                           D50.0, Iron deficiency anemia secondary to blood                            loss (chronic) CPT copyright 2018 American Medical Association. All rights reserved. The codes documented in this report are preliminary and upon coder review may  be revised to meet current compliance requirements. Hildred Laser, MD Hildred Laser, MD 08/04/2018 11:58:10 AM This report has been signed electronically. Number of Addenda: 0

## 2018-08-04 NOTE — Anesthesia Postprocedure Evaluation (Signed)
Anesthesia Post Note  Patient: Kennisha Qin  Procedure(s) Performed: COLONOSCOPY WITH PROPOFOL (N/A ) ESOPHAGOGASTRODUODENOSCOPY (EGD) WITH PROPOFOL (N/A ) BIOPSY  Patient location during evaluation: PACU Anesthesia Type: General Level of consciousness: awake and patient cooperative Pain management: pain level controlled Vital Signs Assessment: post-procedure vital signs reviewed and stable Respiratory status: spontaneous breathing, nonlabored ventilation and respiratory function stable Cardiovascular status: blood pressure returned to baseline Postop Assessment: no apparent nausea or vomiting Anesthetic complications: no     Last Vitals:  Vitals:   08/04/18 1039  BP: (!) 135/91  Pulse: 99  Resp: 18  Temp: 36.8 C  SpO2: 95%    Last Pain:  Vitals:   08/04/18 1112  TempSrc:   PainSc: 0-No pain                 Jesson Foskey J

## 2018-08-08 ENCOUNTER — Encounter (HOSPITAL_COMMUNITY): Payer: Self-pay | Admitting: Internal Medicine

## 2018-08-09 ENCOUNTER — Encounter (INDEPENDENT_AMBULATORY_CARE_PROVIDER_SITE_OTHER): Payer: Self-pay | Admitting: *Deleted

## 2018-08-09 ENCOUNTER — Other Ambulatory Visit (INDEPENDENT_AMBULATORY_CARE_PROVIDER_SITE_OTHER): Payer: Self-pay | Admitting: *Deleted

## 2018-08-09 ENCOUNTER — Ambulatory Visit: Payer: Medicaid Other | Admitting: Physical Therapy

## 2018-08-09 DIAGNOSIS — D508 Other iron deficiency anemias: Secondary | ICD-10-CM

## 2018-08-09 DIAGNOSIS — R195 Other fecal abnormalities: Secondary | ICD-10-CM

## 2018-08-10 ENCOUNTER — Ambulatory Visit: Payer: Medicaid Other | Admitting: Physical Therapy

## 2018-08-15 DIAGNOSIS — N920 Excessive and frequent menstruation with regular cycle: Secondary | ICD-10-CM | POA: Insufficient documentation

## 2018-08-15 DIAGNOSIS — D219 Benign neoplasm of connective and other soft tissue, unspecified: Secondary | ICD-10-CM | POA: Insufficient documentation

## 2018-08-27 DIAGNOSIS — M47812 Spondylosis without myelopathy or radiculopathy, cervical region: Secondary | ICD-10-CM | POA: Insufficient documentation

## 2018-08-27 DIAGNOSIS — M5459 Other low back pain: Secondary | ICD-10-CM | POA: Insufficient documentation

## 2018-08-27 DIAGNOSIS — G894 Chronic pain syndrome: Secondary | ICD-10-CM | POA: Insufficient documentation

## 2018-09-17 ENCOUNTER — Emergency Department (HOSPITAL_COMMUNITY)
Admission: EM | Admit: 2018-09-17 | Discharge: 2018-09-17 | Disposition: A | Discharge status: Home / Self Care | Payer: Medicaid Other | Attending: Emergency Medicine | Admitting: Emergency Medicine

## 2018-09-17 ENCOUNTER — Encounter (HOSPITAL_COMMUNITY): Payer: Self-pay

## 2018-09-17 ENCOUNTER — Other Ambulatory Visit: Payer: Self-pay

## 2018-09-17 DIAGNOSIS — F1210 Cannabis abuse, uncomplicated: Secondary | ICD-10-CM | POA: Insufficient documentation

## 2018-09-17 DIAGNOSIS — Z79899 Other long term (current) drug therapy: Secondary | ICD-10-CM | POA: Diagnosis not present

## 2018-09-17 DIAGNOSIS — R519 Headache, unspecified: Secondary | ICD-10-CM

## 2018-09-17 DIAGNOSIS — G8929 Other chronic pain: Secondary | ICD-10-CM | POA: Insufficient documentation

## 2018-09-17 DIAGNOSIS — M549 Dorsalgia, unspecified: Secondary | ICD-10-CM | POA: Diagnosis not present

## 2018-09-17 DIAGNOSIS — R51 Headache: Secondary | ICD-10-CM | POA: Diagnosis present

## 2018-09-17 MED ORDER — PROCHLORPERAZINE EDISYLATE 10 MG/2ML IJ SOLN
10.0000 mg | Freq: Once | INTRAMUSCULAR | Status: AC
  Administered 2018-09-17: 10 mg via INTRAVENOUS
  Filled 2018-09-17: qty 2

## 2018-09-17 MED ORDER — KETOROLAC TROMETHAMINE 15 MG/ML IJ SOLN
15.0000 mg | Freq: Once | INTRAMUSCULAR | Status: AC
  Administered 2018-09-17: 15 mg via INTRAVENOUS
  Filled 2018-09-17: qty 1

## 2018-09-17 MED ORDER — SODIUM CHLORIDE 0.9 % IV BOLUS
1000.0000 mL | Freq: Once | INTRAVENOUS | Status: AC
  Administered 2018-09-17: 1000 mL via INTRAVENOUS

## 2018-09-17 NOTE — ED Provider Notes (Signed)
[facility] Emergency Department Provider Note MRN:  [id number]  Arrival date & time: 09/17/18     Chief Complaint   Headache and Back Pain   History of Present Illness   Destiny Beck is a 48 y.o. year-old female with a history of migraine, chronic pain presenting to the ED with chief complaint of headache.  Headache similar to prior headaches in the past, which patient has frequently.  Frontal, gradual onset, began 8 hours ago, constant, worse with bright lights, worse with loud noises.  Patient took some extra doses of her Xanax and Percocet medications to try to help, did not help.  Having nausea, 3 episodes of nonbloody nonbilious emesis.  Denies chest pain or shortness of breath, no abdominal pain, no numbness weakness to the arms or legs.  Experiencing her normal chronic back pain, no bowel or bladder dysfunction.  Review of Systems  A complete 10 system review of systems was obtained and all systems are negative except as noted in the HPI and PMH.   Patient's Health History    Past Medical History:  Diagnosis Date  . Abscess   . Anxiety   . Chronic back pain   . Chronic fatigue   . Chronic neck pain   . Depression   . Dizziness   . Headache   . Occipital neuralgia   . Sciatica     Past Surgical History:  Procedure Laterality Date  . BIOPSY  08/04/2018   Procedure: BIOPSY;  Surgeon: Rogene Houston, MD;  [facility];  Service: Endoscopy;;  gastric   . CESAREAN SECTION     x2  . COLONOSCOPY WITH PROPOFOL N/A 08/04/2018   Procedure: COLONOSCOPY WITH PROPOFOL;  Surgeon: Rogene Houston, MD;  Location: [facility];  Service: Endoscopy;  Laterality: N/A;  11.50  . ESOPHAGOGASTRODUODENOSCOPY (EGD) WITH PROPOFOL N/A 08/04/2018   Procedure: ESOPHAGOGASTRODUODENOSCOPY (EGD) WITH PROPOFOL;  Surgeon: Rogene Houston, MD;  Location: [facility];  Service: Endoscopy;  Laterality: N/A;  . TUBAL LIGATION      History reviewed. No pertinent  family history.  Social History   Socioeconomic History  . Marital status: Divorced    Spouse name: Not on file  . Number of children: Not on file  . Years of education: Not on file  . Highest education level: Not on file  Occupational History  . Not on file  Social Needs  . Financial resource strain: Not on file  . Food insecurity:    Worry: Not on file    Inability: Not on file  . Transportation needs:    Medical: Not on file    Non-medical: Not on file  Tobacco Use  . Smoking status: Former Smoker    Packs/day: 0.50    Years: 30.00    Pack years: 15.00    Types: Cigarettes    Last attempt to quit: 01/07/2017    Years since quitting: 1.6  . Smokeless tobacco: Never Used  Substance and Sexual Activity  . Alcohol use: Yes    Comment: Occ  . Drug use: Yes    Types: Marijuana    Comment: last used 07/30/2018  . Sexual activity: Yes    Birth control/protection: Surgical  Lifestyle  . Physical activity:    Days per week: Not on file    Minutes per session: Not on file  . Stress: Not on file  Relationships  . Social connections:    Talks on phone: Not on file  Gets together: Not on file    Attends religious service: Not on file    Active member of club or organization: Not on file    Attends meetings of clubs or organizations: Not on file    Relationship status: Not on file  . Intimate partner violence:    Fear of current or ex partner: Not on file    Emotionally abused: Not on file    Physically abused: Not on file    Forced sexual activity: Not on file  Other Topics Concern  . Not on file  Social History Narrative  . Not on file     Physical Exam  Vital Signs and Nursing Notes reviewed Vitals:   09/17/18 0618  BP: 132/88  Pulse: 93  Resp: 20  Temp: 97.9 F (36.6 C)  SpO2: 100%    CONSTITUTIONAL: Well-appearing, NAD NEURO:  Alert and oriented x 3, no focal deficits, mildly intoxicated, mild slurred speech, no meningismus EYES:  eyes equal and  reactive ENT/NECK:  no LAD, no JVD CARDIO: Regular rate, well-perfused, normal S1 and S2 PULM:  CTAB no wheezing or rhonchi GI/GU:  normal bowel sounds, non-distended, non-tender MSK/SPINE:  No gross deformities, no edema SKIN:  no rash, atraumatic PSYCH:  Appropriate speech and behavior  Diagnostic and Interventional Summary    Labs Reviewed - No data to display  No orders to display    Medications  sodium chloride 0.9 % bolus 1,000 mL (1,000 mLs Intravenous New Bag/Given 09/17/18 0732)  ketorolac (TORADOL) 15 MG/ML injection 15 mg (15 mg Intravenous Given 09/17/18 0733)  prochlorperazine (COMPAZINE) injection 10 mg (10 mg Intravenous Given 09/17/18 1610)     Procedures Critical Care  ED Course and Medical Decision Making  I have reviewed the triage vital signs and the nursing notes.  Pertinent labs & imaging results that were available during my care of the patient were reviewed by me and considered in my medical decision making (see below for details).  Consistent with prior migraines per patient, no concerning neurological features, no fever, will attempt migraine specific medications and monitor the patient closely giving her self-medication with Xanax and Percocets at home.  Upon reassessment patient's headache is resolved, requesting discharge.  Repeat neurological exam reassuring.  After the discussed management above, the patient was determined to be safe for discharge.  The patient was in agreement with this plan and all questions regarding their care were answered.  ED return precautions were discussed and the patient will return to the ED with any significant worsening of condition.  Barth Kirks. Sedonia Small, MD [place] [email or web address]  Final Clinical Impressions(s) / ED Diagnoses     ICD-10-CM   1. Nonintractable headache, unspecified chronicity pattern, unspecified headache type R51     ED Discharge Orders    None           Maudie Flakes, MD 09/17/18 [phone]

## 2018-09-17 NOTE — Discharge Instructions (Signed)
You were evaluated in the Emergency Department and after careful evaluation, we did not find any emergent condition requiring admission or further testing in the hospital. ° °Please return to the Emergency Department if you experience any worsening of your condition.  We encourage you to follow up with a primary care provider.  Thank you for allowing us to be a part of your care. °

## 2018-09-17 NOTE — ED Triage Notes (Signed)
Pt has had head pain the last 5 hours. Says medication isnt working any more.    10 hydrocodone 1.5 blue Xanax  In last 5 hours. Did not help

## 2018-09-19 DIAGNOSIS — G43009 Migraine without aura, not intractable, without status migrainosus: Secondary | ICD-10-CM | POA: Insufficient documentation

## 2018-09-22 ENCOUNTER — Other Ambulatory Visit: Payer: Self-pay

## 2018-09-22 ENCOUNTER — Emergency Department (HOSPITAL_COMMUNITY): Payer: Medicaid Other

## 2018-09-22 ENCOUNTER — Emergency Department (HOSPITAL_COMMUNITY)
Admission: EM | Admit: 2018-09-22 | Discharge: 2018-09-23 | Disposition: A | Discharge status: Home / Self Care | Payer: Medicaid Other | Attending: Emergency Medicine | Admitting: Emergency Medicine

## 2018-09-22 ENCOUNTER — Encounter (HOSPITAL_COMMUNITY): Payer: Self-pay

## 2018-09-22 DIAGNOSIS — Z79899 Other long term (current) drug therapy: Secondary | ICD-10-CM | POA: Insufficient documentation

## 2018-09-22 DIAGNOSIS — F411 Generalized anxiety disorder: Secondary | ICD-10-CM | POA: Diagnosis not present

## 2018-09-22 DIAGNOSIS — F329 Major depressive disorder, single episode, unspecified: Secondary | ICD-10-CM | POA: Diagnosis present

## 2018-09-22 DIAGNOSIS — F332 Major depressive disorder, recurrent severe without psychotic features: Secondary | ICD-10-CM | POA: Insufficient documentation

## 2018-09-22 DIAGNOSIS — R4182 Altered mental status, unspecified: Secondary | ICD-10-CM | POA: Diagnosis not present

## 2018-09-22 DIAGNOSIS — F99 Mental disorder, not otherwise specified: Secondary | ICD-10-CM

## 2018-09-22 LAB — CBC
HCT: 41.3 % (ref 36.0–46.0)
Hemoglobin: 13.1 g/dL (ref 12.0–15.0)
MCH: 29.4 pg (ref 26.0–34.0)
MCHC: 31.7 g/dL (ref 30.0–36.0)
MCV: 92.8 fL (ref 80.0–100.0)
Platelets: 273 10*3/uL (ref 150–400)
RBC: 4.45 MIL/uL (ref 3.87–5.11)
RDW: 19.1 % — ABNORMAL HIGH (ref 11.5–15.5)
WBC: 5.3 10*3/uL (ref 4.0–10.5)
nRBC: 0 % (ref 0.0–0.2)

## 2018-09-22 LAB — RAPID URINE DRUG SCREEN, HOSP PERFORMED
Amphetamines: NOT DETECTED
Barbiturates: NOT DETECTED
Benzodiazepines: POSITIVE — AB
Cocaine: NOT DETECTED
Opiates: POSITIVE — AB
Tetrahydrocannabinol: NOT DETECTED

## 2018-09-22 LAB — COMPREHENSIVE METABOLIC PANEL
ALT: 52 U/L — ABNORMAL HIGH (ref 0–44)
AST: 46 U/L — AB (ref 15–41)
Albumin: 4.4 g/dL (ref 3.5–5.0)
Alkaline Phosphatase: 74 U/L (ref 38–126)
Anion gap: 9 (ref 5–15)
BUN: 11 mg/dL (ref 6–20)
CHLORIDE: 109 mmol/L (ref 98–111)
CO2: 21 mmol/L — ABNORMAL LOW (ref 22–32)
Calcium: 8.9 mg/dL (ref 8.9–10.3)
Creatinine, Ser: 0.91 mg/dL (ref 0.44–1.00)
GFR calc Af Amer: >60 mL/min (ref >60)
GFR calc non Af Amer: >60 mL/min (ref >60)
Glucose, Bld: 120 mg/dL — ABNORMAL HIGH (ref 70–99)
Potassium: 4 mmol/L (ref 3.5–5.1)
Sodium: 139 mmol/L (ref 135–145)
Total Bilirubin: 0.3 mg/dL (ref 0.3–1.2)
Total Protein: 7.3 g/dL (ref 6.5–8.1)

## 2018-09-22 LAB — DIFFERENTIAL
Basophils Absolute: 0 10*3/uL (ref 0.0–0.1)
Basophils Relative: 1 %
Eosinophils Absolute: 0.1 10*3/uL (ref 0.0–0.5)
Eosinophils Relative: 2 %
Lymphocytes Relative: 18 %
Lymphs Abs: 1 10*3/uL (ref 0.7–4.0)
MONOS PCT: 10 %
Monocytes Absolute: 0.5 10*3/uL (ref 0.1–1.0)
NEUTROS ABS: 3.6 10*3/uL (ref 1.7–7.7)
Neutrophils Relative %: 69 %

## 2018-09-22 LAB — ETHANOL: Alcohol, Ethyl (B): <10 mg/dL (ref <10)

## 2018-09-22 MED ORDER — IBUPROFEN 400 MG PO TABS: 400.0000 mg | ORAL_TABLET | Freq: Once | ORAL | Status: DC

## 2018-09-22 MED ORDER — ACETAMINOPHEN 325 MG PO TABS
650.0000 mg | ORAL_TABLET | Freq: Once | ORAL | Status: AC
  Administered 2018-09-22: 650 mg via ORAL

## 2018-09-22 MED ORDER — FAMOTIDINE 20 MG PO TABS
20.0000 mg | ORAL_TABLET | Freq: Two times a day (BID) | ORAL | Status: DC
  Administered 2018-09-22 – 2018-09-23 (×2): 20 mg via ORAL
  Filled 2018-09-22 (×2): qty 1

## 2018-09-22 MED ORDER — IBUPROFEN 400 MG PO TABS
400.0000 mg | ORAL_TABLET | ORAL | Status: DC | PRN
  Filled 2018-09-22: qty 1

## 2018-09-22 MED ORDER — ACETAMINOPHEN 325 MG PO TABS
ORAL_TABLET | ORAL | Status: AC
  Filled 2018-09-22: qty 2

## 2018-09-22 MED ORDER — LORAZEPAM 2 MG/ML IJ SOLN
1.0000 mg | Freq: Once | INTRAMUSCULAR | Status: AC
  Administered 2018-09-22: 1 mg via INTRAVENOUS
  Filled 2018-09-22: qty 1

## 2018-09-22 MED ORDER — LORAZEPAM 1 MG PO TABS
1.0000 mg | ORAL_TABLET | Freq: Three times a day (TID) | ORAL | Status: DC
  Administered 2018-09-22 – 2018-09-23 (×2): 1 mg via ORAL
  Filled 2018-09-22 (×2): qty 1

## 2018-09-22 MED ORDER — HYDROMORPHONE HCL 1 MG/ML IJ SOLN
1.0000 mg | Freq: Once | INTRAMUSCULAR | Status: AC
  Administered 2018-09-22: 1 mg via INTRAVENOUS
  Filled 2018-09-22: qty 1

## 2018-09-22 NOTE — ED Notes (Signed)
Teleneurology  Consult deferred by Dr. Roderic Palau at this time.

## 2018-09-22 NOTE — ED Notes (Signed)
Patient receiving teleneuro at this time.

## 2018-09-22 NOTE — ED Notes (Signed)
Gave patient meal tray.

## 2018-09-22 NOTE — ED Provider Notes (Addendum)
[name] Note   CSN: [id number] Arrival date & time: 09/22/18  1335    History   Chief Complaint Chief Complaint  Patient presents with   V70.1    HPI Destiny Beck is a 48 y.o. female.     Patient was sent here because of suicidal thoughts.  Patient has a history of anxiety and depression.  Her doctor stopped her Xanax because she tested positive for marijuana.  She has not been on it for 2 weeks and according to her daughter she has been having difficulty speaking for about 2 weeks but the last 24 hours she cannot get her words out almost not at all  The history is provided by the patient. No language interpreter was used.  Altered Mental Status  Presenting symptoms: behavior changes   Severity:  Moderate Most recent episode:  Yesterday Episode history:  Single Timing:  Constant Progression:  Waxing and waning Chronicity:  New Context: not alcohol use   Associated symptoms: no abdominal pain, no hallucinations, no headaches, no rash and no seizures     Past Medical History:  Diagnosis Date   Abscess    Anxiety    Chronic back pain    Chronic fatigue    Chronic neck pain    Depression    Dizziness    Headache    Occipital neuralgia    Sciatica     Patient Active Problem List   Diagnosis Date Noted   Heme positive stool 07/26/2018   Absolute anemia 07/26/2018    Past Surgical History:  Procedure Laterality Date   BIOPSY  08/04/2018   Procedure: BIOPSY;  Surgeon: Rogene Houston, MD;  [facility];  Service: Endoscopy;;  gastric    CESAREAN SECTION     x2   COLONOSCOPY WITH PROPOFOL N/A 08/04/2018   Procedure: COLONOSCOPY WITH PROPOFOL;  Surgeon: Rogene Houston, MD;  [facility];  Service: Endoscopy;  Laterality: N/A;  11.50   ESOPHAGOGASTRODUODENOSCOPY (EGD) WITH PROPOFOL N/A 08/04/2018   Procedure: ESOPHAGOGASTRODUODENOSCOPY (EGD) WITH PROPOFOL;  Surgeon: Rogene Houston, MD;   [facility];  Service: Endoscopy;  Laterality: N/A;   TUBAL LIGATION       OB History    Gravida  3   Para      Term      Preterm      AB      Living  3     SAB      TAB      Ectopic      Multiple      Live Births               Home Medications    Prior to Admission medications   Medication Sig Start Date End Date Taking? Authorizing Provider  acetaminophen (TYLENOL) 500 MG tablet Take 1,000 mg by mouth every 6 (six) hours as needed (pain).    [provider]  ALPRAZolam Duanne Moron) 1 MG tablet Take 1 mg by mouth 2 (two) times daily.     [provider]  buPROPion (WELLBUTRIN XL) 300 MG 24 hr tablet Take 300 mg by mouth daily.    [provider]  cyclobenzaprine (FLEXERIL) 10 MG tablet Take 1 tablet (10 mg total) by mouth 3 (three) times daily as needed. Patient taking differently: Take 10 mg by mouth 2 (two) times daily.  10/29/16   Triplett, Tammy, PA-C  DULoxetine (CYMBALTA) 30 MG capsule Take 30 mg  by mouth 2 (two) times daily.     [provider]  escitalopram (LEXAPRO) 20 MG tablet Take 20 mg by mouth daily.     [provider]  famotidine (PEPCID) 20 MG tablet Take 1 tablet (20 mg total) by mouth 2 (two) times daily. 08/04/18   Rogene Houston, MD  ferrous sulfate 325 (65 FE) MG EC tablet Take 325 mg by mouth daily with breakfast.     [provider]  gabapentin (NEURONTIN) 400 MG capsule Take 400 mg by mouth 3 (three) times daily.    [provider]  Multiple Vitamins-Minerals (MULTIVITAMIN WITH MINERALS) tablet Take 1 tablet by mouth daily.    [provider]    Family History No family history on file.  Social History Social History   Tobacco Use   Smoking status: Former Smoker    Packs/day: 0.50    Years: 30.00    Pack years: 15.00    Types: Cigarettes    Last attempt to quit: 01/07/2017    Years since quitting: 1.7   Smokeless tobacco: Never Used  Substance  Use Topics   Alcohol use: Yes    Comment: Occ   Drug use: Not Currently    Types: Marijuana     Allergies   Percocet [oxycodone-acetaminophen]   Review of Systems Review of Systems  Constitutional: Negative for appetite change and fatigue.  HENT: Negative for congestion, ear discharge and sinus pressure.   Eyes: Negative for discharge.  Respiratory: Negative for cough.   Cardiovascular: Negative for chest pain.  Gastrointestinal: Negative for abdominal pain and diarrhea.  Genitourinary: Negative for frequency and hematuria.  Musculoskeletal: Negative for back pain.  Skin: Negative for rash.  Neurological: Negative for seizures and headaches.       Slurred speech  Psychiatric/Behavioral: Positive for dysphoric mood. Negative for hallucinations.     Physical Exam Updated Vital Signs BP 101/70 (BP Location: Left Arm)    Pulse 85    Temp 98.8 F (37.1 C) (Oral)    Resp 19    Ht 5' (1.524 m)    Wt 81.2 kg    LMP 09/03/2018    SpO2 94%    BMI 34.96 kg/m   Physical Exam Vitals signs and nursing note reviewed.  Constitutional:      Appearance: She is well-developed.  HENT:     Head: Normocephalic.  Eyes:     General: No scleral icterus.    Conjunctiva/sclera: Conjunctivae normal.  Neck:     Musculoskeletal: Neck supple.     Thyroid: No thyromegaly.  Cardiovascular:     Rate and Rhythm: Normal rate and regular rhythm.     Heart sounds: No murmur. No friction rub. No gallop.   Pulmonary:     Breath sounds: No stridor. No wheezing or rales.  Chest:     Chest wall: No tenderness.  Abdominal:     General: There is no distension.     Tenderness: There is no abdominal tenderness. There is no rebound.  Musculoskeletal: Normal range of motion.  Lymphadenopathy:     Cervical: No cervical adenopathy.  Skin:    Findings: No erythema or rash.  Neurological:     Mental Status: She is oriented to person, place, and time.     Motor: No abnormal muscle tone.      Coordination: Coordination normal.     Comments: With slurred speech and stutter  Psychiatric:     Comments: Depressed but not suicidal or homicidal  and not hallucinating      ED Treatments / Results  Labs (all labs ordered are listed, but only abnormal results are displayed) Labs Reviewed  COMPREHENSIVE METABOLIC PANEL - Abnormal; Notable for the following components:      Result Value   CO2 21 (*)    Glucose, Bld 120 (*)    AST 46 (*)    ALT 52 (*)    All other components within normal limits  CBC - Abnormal; Notable for the following components:   RDW 19.1 (*)    All other components within normal limits  ETHANOL  DIFFERENTIAL  RAPID URINE DRUG SCREEN, HOSP PERFORMED  URINALYSIS, ROUTINE W REFLEX MICROSCOPIC    EKG None  Radiology Ct Head Wo Contrast  Result Date: 09/22/2018 CLINICAL DATA:  Altered mental status EXAM: CT HEAD WITHOUT CONTRAST TECHNIQUE: Contiguous axial images were obtained from the base of the skull through the vertex without intravenous contrast. COMPARISON:  Head CT 03/12/2017 FINDINGS: Brain: There is no mass, hemorrhage or extra-axial collection. The size and configuration of the ventricles and extra-axial CSF spaces are normal. The brain parenchyma is normal, without acute or chronic infarction. Vascular: No abnormal hyperdensity of the major intracranial arteries or dural venous sinuses. No intracranial atherosclerosis. Skull: The visualized skull base, calvarium and extracranial soft tissues are normal. Sinuses/Orbits: No fluid levels or advanced mucosal thickening of the visualized paranasal sinuses. No mastoid or middle ear effusion. The orbits are normal. IMPRESSION: Normal head CT. Electronically Signed   By: Ulyses Jarred M.D.   On: 09/22/2018 15:11   Ct Soft Tissue Neck Wo Contrast  Result Date: 09/22/2018 CLINICAL DATA:  Dystonia. EXAM: CT NECK WITHOUT CONTRAST TECHNIQUE: Multidetector CT imaging of the neck was performed following the standard  protocol without intravenous contrast. COMPARISON:  None. FINDINGS: PHARYNX AND LARYNX: --Nasopharynx: Fossae of Rosenmuller are clear. Normal adenoid tonsils for age. --Oral cavity and oropharynx: The palatine and lingual tonsils are normal. The visible oral cavity and floor of mouth are normal. --Hypopharynx: Normal vallecula and pyriform sinuses. --Larynx: Normal epiglottis and pre-epiglottic space. Normal aryepiglottic and vocal folds. --Retropharyngeal space: No abscess, effusion or lymphadenopathy. SALIVARY GLANDS: --Parotid: No mass lesion or inflammation. No sialolithiasis or ductal dilatation. --Submandibular: Symmetric without inflammation. No sialolithiasis or ductal dilatation. --Sublingual: Normal. No ranula or other visible lesion of the base of tongue and floor of mouth. THYROID: Normal. LYMPH NODES: No enlarged or abnormal density lymph nodes. VASCULAR: Limited assessment without IV contrast but unremarkable. LIMITED INTRACRANIAL: Normal. VISUALIZED ORBITS: Normal. MASTOIDS AND VISUALIZED PARANASAL SINUSES: No fluid levels or advanced mucosal thickening. No mastoid effusion. SKELETON: No bony spinal canal stenosis. No lytic or blastic lesions. UPPER CHEST: Clear. OTHER: None. IMPRESSION: Normal CT of the neck. Electronically Signed   By: Ulyses Jarred M.D.   On: 09/22/2018 15:15   Mr Brain Wo Contrast  Result Date: 09/22/2018 CLINICAL DATA:  Altered level of consciousness for 2 days. History of headache, chronic fatigue. EXAM: MRI HEAD WITHOUT CONTRAST TECHNIQUE: Multiplanar, multiecho pulse sequences of the brain and surrounding structures were obtained without intravenous contrast. COMPARISON:  CT HEAD October 09, 2018 FINDINGS: Mild motion degraded examination. INTRACRANIAL CONTENTS: No reduced diffusion to suggest acute ischemia or hyperacute demyelination. No susceptibility artifact to suggest hemorrhage. No parenchymal brain volume loss for age. No hydrocephalus. No suspicious parenchymal  signal, masses, mass effect. No abnormal extra-axial fluid collections. No extra-axial masses. VASCULAR: Normal major intracranial vascular flow voids present at skull base. SKULL AND UPPER  CERVICAL SPINE: No abnormal sellar expansion. No suspicious calvarial bone marrow signal. Craniocervical junction maintained. SINUSES/ORBITS: Trace frontal sinus mucosal thickening without air-fluid levels. Trace LEFT mastoid effusion.The included ocular globes and orbital contents are non-suspicious. OTHER: None. IMPRESSION: Negative mildly motion degraded noncontrast MRI head. Electronically Signed   By: Elon Alas M.D.   On: 09/22/2018 18:21    Procedures Procedures (including critical care time)  Medications Ordered in ED Medications  LORazepam (ATIVAN) injection 1 mg (1 mg Intravenous Given 09/22/18 1444)  HYDROmorphone (DILAUDID) injection 1 mg (1 mg Intravenous Given 09/22/18 1444)     Initial Impression / Assessment and Plan / ED Course  I have reviewed the triage vital signs and the nursing notes.  Pertinent labs & imaging results that were available during my care of the patient were reviewed by me and considered in my medical decision making (see chart for details). The patient was seen by neurology and it was decided she does not have a stroke that her stuttering is probably more psychiatric related.  Patient will be seen by behavioral health for medication suggestion and evaluation for suicidal thoughts   Patient was seen by behavioral health.  Behavioral health has decided to agree with neurology and start the patient on Ativan 1 mg 3 times a day to see if her speech improves.  They will reevaluate her for the speech problem and her suicidal ideations in the morning      Final Clinical Impressions(s) / ED Diagnoses   Final diagnoses:  None    ED Discharge Orders    None       Milton Ferguson, MD 09/22/18 Leeanne Mannan    Milton Ferguson, MD 09/22/18 2123

## 2018-09-22 NOTE — ED Triage Notes (Addendum)
Pt brought to ED via [facility] for SI. Pt states her MD took her off her xanax because she failed a drug screen for marijuana a few weeks ago and the past few days she has had increased depression, anxiety and suicidal thoughts without plan. Pt states her stuttering has gotten worse since stopping xanax.
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
## 2018-09-22 NOTE — ED Notes (Signed)
Pt c/o lower back pain 7/10

## 2018-09-22 NOTE — Consult Note (Signed)
[facility] TeleNeurology Consult Services Asked to see this patient in telemedicine consultation. Consultation was performed with assistance of ancillary / medical staff at bedside.  Impression: Stuttering speech for several weeks; MRI brain shows no acute stroke Differential Diagnosis: - Functional speech disorder.  Presentation: 48 year old female with stuttering speech for the last few weeks, which worsened in the last day.  She states that this started after Xanax was recently discontinued.     Telestroke Assessment: Patient is in no apparent distress. Patient appears as stated age. No obvious acute respiratory or cardiac distress. Patient is well groomed and well-nourished.  Mental status: awake, alert, oriented x 3.  Speech is stuttering but non-dysarthric. Cranial nerve testing: PERRL, EOMI, no facial asymmetry Motor: no pronator drift; no drift in the legs.  Mild right torticollis. Sensory: intact LT in the arms and legs.  Comments: STAT consult   Recommendations: 1. Psychiatry consultation. 2. Trial of restart of Xanax. 3. Outpatient neurology follow-up.   Discussed with ED physician. Please call with questions.  Dr. Collins [place]. Tommi Rumps [facility]  Medical Decision Making: - Extensive number of diagnosis or management options are considered above. - Extensive amount of complex data reviewed. - High risk of complication and/or morbidity or mortality are associated with differential diagnostic considerations above. - There may be uncertain outcome and increased probability of prolonged functional impairment or high probability of severe prolonged functional impairment associated with some of these differential diagnosis. Medical Data Reviewed: 1. Data reviewed include clinical labs, radiology, medical tests; 2. Tests results discussed w/performing or interpreting physician; 3. Obtaining/reviewing old medical records; 4. Obtaining case history from another  source; 5. Independent review of images.

## 2018-09-23 LAB — URINALYSIS, ROUTINE W REFLEX MICROSCOPIC
Bilirubin Urine: NEGATIVE
Glucose, UA: NEGATIVE mg/dL
Ketones, ur: NEGATIVE mg/dL
Leukocytes,Ua: NEGATIVE
Nitrite: NEGATIVE
PH: 5 (ref 5.0–8.0)
Protein, ur: 30 mg/dL — AB
RBC / HPF: >50 RBC/hpf — ABNORMAL HIGH (ref 0–5)
Specific Gravity, Urine: 1.023 (ref 1.005–1.030)

## 2018-09-23 MED ORDER — LORAZEPAM 1 MG PO TABS: 1.0000 mg | ORAL_TABLET | Freq: Three times a day (TID) | ORAL | 0 refills | Status: DC | PRN

## 2018-09-23 NOTE — Progress Notes (Signed)
Patient is seen by me via tele-psych and I have consulted with Dr. Parke Poisson.  Patient denies any suicidal homicidal ideations and denies any hallucinations.  She complains of having issues with stuttering and ambulation for the last couple weeks since she stopped her Xanax.  Discussed patient's medications and research the PMP aware database.  Patient Xanax was prescribed on 08/23/2026 1 mg p.o. twice daily and she reports that she overused them and ran out approximately 2 weeks ago however she is still positive for benzos today.  Patient was also prescribed Norco 5-3 25 on 09/01/2018 and on 09/08/2018 #42 tabs for 7 days eats prescription.  I also reviewed patient's chart and confirmed with patient that she has been currently taking Lexapro 20 mg p.o. daily and Wellbutrin 300 mg p.o. daily and was started on Cymbalta 30 mg daily with the intention of increasing to 60 approximately 2 weeks ago.  Patient also reports that she takes gabapentin 400 mg p.o. 3 times daily.  Discussed the medications and the patient's issues with Dr. Parke Poisson and recommended that due to the high risk of serotonin syndrome to discontinue the Cymbalta as well as the was started approximately the same time the patient reported problems starting.  Recommended that the patient could potentially go home with a Ativan detox protocol but that is left up to Dr. Sedonia Small if he feels comfortable prescribing it for the patient.  Patient reports that she has a psych appointment scheduled in May.  Also did note patient's speech does not appear to be stuttering, but more as a forced speech and is slow.  She does not appear to have any myoclonus, but reports having difficulty walking.  At this time patient does not meet inpatient criteria for psychiatric cleared.  I have contacted Dr. Sedonia Small and notified him of the recommendations.

## 2018-09-23 NOTE — ED Notes (Signed)
TTS in progress at this time.  

## 2018-09-23 NOTE — ED Notes (Signed)
Pt still presents with stuttering speech, weakness and unsteady gait. Will continue using wheelchair for transferring to restroom. Yellow socks and arm band have been placed on Pt.

## 2018-09-23 NOTE — ED Notes (Signed)
Patient's daughter, Luetta Nutting, called and wanted update on patient. Patient gave this nurse permission to discuss all medical and psych care with daughter. Advised daughter of need to observe patient overnight and re-assess in the morning. Daughter verbalized understanding.

## 2018-09-23 NOTE — ED Provider Notes (Signed)
Patient was evaluated by TTS, who recommends stopping Cymbalta as she is on a number of similar mechanism medications.  Patient is no longer endorsing suicidal or homicidal ideation according to TTS.  This was echoed during my assessment as well.  I personally contracted the patient for safety, who agrees to come to the emergency department prior to any actions related to self-harm or the harm of others.  Hesitantly, I prescribed patient small amount of Ativan to prevent significant benzo withdrawal, I advise close PCP follow-up.  Barth Kirks. Sedonia Small, [place] [email or web address]    Maudie Flakes, MD 09/23/18 [phone]

## 2018-09-23 NOTE — ED Notes (Signed)
Message left for pt's daughter Luetta Nutting) to call ED for transport home.

## 2018-09-23 NOTE — Discharge Instructions (Addendum)
You were evaluated in the Emergency Department and after careful evaluation, we did not find any emergent condition requiring admission or further testing in the hospital.  Please stop taking your Cymbalta and follow-up with your regular doctor.  Use the Ativan prescription sparingly, only if you begin to experience significant anxiety or tremulousness.  Please return to the Emergency Department if you experience any worsening of your condition.  We encourage you to follow up with a primary care provider.  Thank you for allowing [place] to be a part of your care.

## 2018-09-23 NOTE — ED Notes (Signed)
Patient taken to bathroom via wheelchair with tech/sitter at side. Urine sample collected. Patient returned to room and placed back in bed.

## 2018-10-30 ENCOUNTER — Telehealth: Payer: Self-pay

## 2018-11-01 ENCOUNTER — Encounter (INDEPENDENT_AMBULATORY_CARE_PROVIDER_SITE_OTHER): Payer: Self-pay

## 2018-11-02 ENCOUNTER — Telehealth (INDEPENDENT_AMBULATORY_CARE_PROVIDER_SITE_OTHER): Payer: Self-pay | Admitting: Internal Medicine

## 2018-11-02 ENCOUNTER — Encounter (INDEPENDENT_AMBULATORY_CARE_PROVIDER_SITE_OTHER): Payer: Self-pay

## 2018-11-02 NOTE — Telephone Encounter (Signed)
err

## 2018-11-02 NOTE — Telephone Encounter (Signed)
The previous message was not for this patient.

## 2018-11-06 ENCOUNTER — Other Ambulatory Visit: Payer: Self-pay

## 2018-11-06 ENCOUNTER — Ambulatory Visit (INDEPENDENT_AMBULATORY_CARE_PROVIDER_SITE_OTHER): Payer: Medicaid Other | Admitting: Internal Medicine

## 2018-11-06 ENCOUNTER — Encounter (INDEPENDENT_AMBULATORY_CARE_PROVIDER_SITE_OTHER): Payer: Self-pay | Admitting: Internal Medicine

## 2018-11-06 ENCOUNTER — Telehealth (INDEPENDENT_AMBULATORY_CARE_PROVIDER_SITE_OTHER): Payer: Self-pay | Admitting: Internal Medicine

## 2018-11-06 DIAGNOSIS — K5900 Constipation, unspecified: Secondary | ICD-10-CM | POA: Diagnosis not present

## 2018-11-06 DIAGNOSIS — R140 Abdominal distension (gaseous): Secondary | ICD-10-CM | POA: Diagnosis not present

## 2018-11-06 MED ORDER — LUBIPROSTONE 24 MCG PO CAPS: 24.0000 ug | ORAL_CAPSULE | Freq: Two times a day (BID) | ORAL | 3 refills | Status: DC

## 2018-11-06 NOTE — Progress Notes (Addendum)
Subjective:  PCP Lars Mage NP  Patient ID: Destiny Beck, female    DOB: 22-May-1971, 48 y.o.   MRN: [id number] Start time 2:55 Ended 3:10pm. OV visit time 15 minutes.   This a telephone OV. Patient consents to this OV. She is at home. I am in the office. This is a telephone OV due to risk of COVID-19.   Chief compaint: bloating, constipation, abdominal distention. She says she bloats just around her breast bone. Has taken 2 enemas for constipation. .  Had good results with the 1st enema. . She is taking stool softeners, eating prunes, and fruits for fiber. . She has hard time walking because of the tightness in her upper abdomen. Feels better after an enema.   She is eating more fiber. She tells me she looks pregnant. She does have a BM daily or everyday and her stools are hard.  He symptoms started for a few months now.  She has gained about 20 pounds over the last few months. She admits her appetite has increased due to an anti depressent that was added to her medicine.  Underwent a colonoscopy/EGD in January of this year. (Chronic blood loss and NSAID use).  EGD: Impression:               - Normal proximal esophagus, mid esophagus and                            distal esophagus.                           - LA Grade A reflux esophagitis.                           - 2 cm hiatal hernia.                           - Scar in the prepyloric region of the stomach.                           - Gastritis. Biopsied.                           - Normal duodenal bulb and second portion of the   Gastric biopsy shows chronic gastritis but no evidence of H. Pylori. Gastritis possibly related to NSAID use which she has stopped.                             duodenum. Colonoscopy: Impression:               - The entire examined colon is normal.                           - External hemorrhoids.                           - No specimens collected.   Review of Systems  Past Medical History:   Diagnosis Date  . Abscess   . Anxiety   . Chronic back pain   . Chronic fatigue   . Chronic neck pain   . Depression   . Dizziness   .  Headache   . Occipital neuralgia   . Sciatica     Past Surgical History:  Procedure Laterality Date  . BIOPSY  08/04/2018   Procedure: BIOPSY;  Surgeon: Rogene Houston, MD;  [facility];  Service: Endoscopy;;  gastric   . CESAREAN SECTION     x2  . COLONOSCOPY WITH PROPOFOL N/A 08/04/2018   Procedure: COLONOSCOPY WITH PROPOFOL;  Surgeon: Rogene Houston, MD;  [facility];  Service: Endoscopy;  Laterality: N/A;  11.50  . ESOPHAGOGASTRODUODENOSCOPY (EGD) WITH PROPOFOL N/A 08/04/2018   Procedure: ESOPHAGOGASTRODUODENOSCOPY (EGD) WITH PROPOFOL;  Surgeon: Rogene Houston, MD;  [facility];  Service: Endoscopy;  Laterality: N/A;  . TUBAL LIGATION      Allergies  Allergen Reactions  . Percocet [Oxycodone-Acetaminophen] Itching and Nausea Only    Current Outpatient Medications on File Prior to Visit  Medication Sig Dispense Refill  . acetaminophen (TYLENOL) 500 MG tablet Take 1,000 mg by mouth every 6 (six) hours as needed (pain).    Marland Kitchen buPROPion (WELLBUTRIN XL) 300 MG 24 hr tablet Take 300 mg by mouth daily.    . cyclobenzaprine (FLEXERIL) 10 MG tablet Take 1 tablet (10 mg total) by mouth 3 (three) times daily as needed. (Patient taking differently: Take 10 mg by mouth 3 (three) times daily. ) 21 tablet 0  . escitalopram (LEXAPRO) 20 MG tablet Take 20 mg by mouth daily.     . famotidine (PEPCID) 20 MG tablet Take 1 tablet (20 mg total) by mouth 2 (two) times daily. 30 tablet 5  . gabapentin (NEURONTIN) 400 MG capsule Take 600 mg by mouth 3 (three) times daily.     . Multiple Vitamins-Minerals (MULTIVITAMIN WITH MINERALS) tablet Take 1 tablet by mouth daily.     No current facility-administered medications on file prior to visit.                Objective:   Physical Exam There were no vitals taken  for this visit.  deferred      Assessment & Plan:  Bloating,Abdominal distention, constipation: US abdomen. Rx for Amitiza called to her pharmacy.  Further recommendation to follow.

## 2018-11-06 NOTE — Telephone Encounter (Signed)
err

## 2018-11-07 ENCOUNTER — Encounter (INDEPENDENT_AMBULATORY_CARE_PROVIDER_SITE_OTHER): Payer: Self-pay

## 2018-11-14 NOTE — Telephone Encounter (Signed)
erroneous

## 2018-11-17 ENCOUNTER — Encounter (INDEPENDENT_AMBULATORY_CARE_PROVIDER_SITE_OTHER): Payer: Self-pay

## 2018-11-29 ENCOUNTER — Other Ambulatory Visit: Payer: Self-pay

## 2018-11-29 ENCOUNTER — Ambulatory Visit (INDEPENDENT_AMBULATORY_CARE_PROVIDER_SITE_OTHER): Payer: Medicaid Other | Admitting: Internal Medicine

## 2018-11-29 ENCOUNTER — Encounter (INDEPENDENT_AMBULATORY_CARE_PROVIDER_SITE_OTHER): Payer: Self-pay | Admitting: Internal Medicine

## 2018-11-29 VITALS — BP 112/69 | HR 80 | Temp 98.2°F | Ht 60.0 in | Wt 180.0 lb

## 2018-11-29 DIAGNOSIS — K760 Fatty (change of) liver, not elsewhere classified: Secondary | ICD-10-CM

## 2018-11-29 NOTE — Progress Notes (Signed)
Subjective:    Patient ID: Destiny Beck, female    DOB: [birth year], 48 y.o.   MRN: [id number]  HPI Here today for f/u. Last seen in April of this year. She c/o abdominal bloating, constipation, abdominal distention.  Hx of constipation. Takes stool softeners, eats prunes and fruits for fiber. For the bloating, she feels better after she has a BM. Usually has a BM daily or every other day. She is UTD or her colonoscopy (this year) 11/22/2018 US abdomen: Enlarge fatty liver, cholelithiasis.  Her appetite is okay. She eats more at night. She is trying to eat healthy. She is eating fruits,vegetables and a lot of water.  Her BMs are moving so so. Some days she has diarrhea and someday she has constipation.  No family hx of fatty liver.  Underwent a colonoscopy/EGD in January of this year. (Chronic blood loss and NSAID use).  EGD: Impression: - Normal proximal esophagus, mid esophagus and  distal esophagus. - LA Grade A reflux esophagitis. - 2 cm hiatal hernia. - Scar in the prepyloric region of the stomach. - Gastritis. Biopsied. - Normal duodenal bulb and second portion of the   Gastric biopsy shows chronic gastritis but no evidence of H. Pylori. Gastritis possibly related to NSAID use which she has stopped.duodenum. Colonoscopy: Impression: - The entire examined colon is normal. - External hemorrhoids. - No specimens collected.   CMP Latest Ref Rng & Units 09/22/2018 03/21/2018 03/12/2017  Glucose 70 - 99 mg/dL 120(H) 89 124(H)  BUN 6 - 20 mg/dL 11 18 13   Creatinine 0.44 - 1.00 mg/dL 0.91 1.15(H) 0.88  Sodium 135 - 145 mmol/L 139 138 138  Potassium 3.5 - 5.1 mmol/L 4.0 4.0 3.5  Chloride 98 - 111 mmol/L 109 105 108  CO2 22 - 32 mmol/L 21(L) 25 22   Calcium 8.9 - 10.3 mg/dL 8.9 9.6 9.3  Total Protein 6.5 - 8.1 g/dL 7.3 7.9 7.6  Total Bilirubin 0.3 - 1.2 mg/dL 0.3 0.3 0.4  Alkaline Phos 38 - 126 U/L 74 85 73  AST 15 - 41 U/L 46(H) 20 22  ALT 0 - 44 U/L 52(H) 20 18   CBC    Component Value Date/Time   WBC 5.3 09/22/2018 1404   RBC 4.45 09/22/2018 1404   HGB 13.1 09/22/2018 1404   HCT 41.3 09/22/2018 1404   PLT 273 09/22/2018 1404   MCV 92.8 09/22/2018 1404   MCH 29.4 09/22/2018 1404   MCHC 31.7 09/22/2018 1404   RDW 19.1 (H) 09/22/2018 1404   LYMPHSABS 1.0 09/22/2018 1404   MONOABS 0.5 09/22/2018 1404   EOSABS 0.1 09/22/2018 1404   BASOSABS 0.0 09/22/2018 1404     Review of Systems Past Medical History:  Diagnosis Date  . Abscess   . Anxiety   . Chronic back pain   . Chronic fatigue   . Chronic neck pain   . Depression   . Dizziness   . Headache   . Occipital neuralgia   . Sciatica     Past Surgical History:  Procedure Laterality Date  . BIOPSY  08/04/2018   Procedure: BIOPSY;  Surgeon: Rogene Houston, MD;  [facility];  Service: Endoscopy;;  gastric   . CESAREAN SECTION     x2  . COLONOSCOPY WITH PROPOFOL N/A 08/04/2018   Procedure: COLONOSCOPY WITH PROPOFOL;  Surgeon: Rogene Houston, MD;  Location: [facility];  Service: Endoscopy;  Laterality: N/A;  11.50  . ESOPHAGOGASTRODUODENOSCOPY (EGD) WITH PROPOFOL N/A  08/04/2018   Procedure: ESOPHAGOGASTRODUODENOSCOPY (EGD) WITH PROPOFOL;  Surgeon: Rogene Houston, MD;  [facility]: [facility];  Service: Endoscopy;  Laterality: N/A;  . TUBAL LIGATION      Allergies  Allergen Reactions  . Percocet [Oxycodone-Acetaminophen] Itching and Nausea Only    Current Outpatient Medications on File Prior to Visit  Medication Sig Dispense Refill  . buPROPion (WELLBUTRIN XL) 300 MG 24 hr tablet Take 300 mg by mouth daily.    Marland Kitchen escitalopram (LEXAPRO) 20 MG tablet Take 20 mg by mouth daily.     . famotidine (PEPCID) 20 MG tablet Take 1 tablet (20 mg total)  by mouth 2 (two) times daily. 30 tablet 5  . gabapentin (NEURONTIN) 400 MG capsule Take 600 mg by mouth 3 (three) times daily.     Marland Kitchen HYDROcodone-acetaminophen (NORCO/VICODIN) 5-325 MG tablet Take 1 tablet by mouth every 6 (six) hours as needed for moderate pain. Takes 1 every 6 hrs.    . lubiprostone (AMITIZA) 24 MCG capsule Take 1 capsule (24 mcg total) by mouth 2 (two) times daily with a meal. 60 capsule 3  . methocarbamol (ROBAXIN) 500 MG tablet Take 500 mg by mouth 2 (two) times a day.    . NON FORMULARY On a Prednisone dose back for neck pain.     No current facility-administered medications on file prior to visit.         Objective:   Physical Exam Blood pressure 112/69, pulse 80, temperature 98.2 F (36.8 C), height 5' (1.524 m), weight 180 lb (81.6 kg). Alert and oriented. Skin warm and dry. Oral mucosa is moist.   . Sclera anicteric, conjunctivae is pink. Thyroid not enlarged. No cervical lymphadenopathy. Lungs clear. Heart regular rate and rhythm.  Abdomen is soft. Bowel sounds are positive. No hepatomegaly. No abdominal masses felt. No tenderness.  No edema to lower extremities.          Assessment & Plan:  Constipaton: Continue fiber, frutis and Amitiza. Fatty Liver: Diet and Exercise. Will get a CMET and Acute hepatitis panel. OV in 6 months.

## 2018-11-29 NOTE — Patient Instructions (Addendum)
Labs today. OV in 6 months. Fatty Liver Disease  Fatty liver disease occurs when too much fat has built up in your liver cells. Fatty liver disease is also called hepatic steatosis or steatohepatitis. The liver removes harmful substances from your bloodstream and produces fluids that your body needs. It also helps your body use and store energy from the food you eat. In many cases, fatty liver disease does not cause symptoms or problems. It is often diagnosed when tests are being done for other reasons. However, over time, fatty liver can cause inflammation that may lead to more serious liver problems, such as scarring of the liver (cirrhosis) and liver failure. Fatty liver is associated with insulin resistance, increased body fat, high blood pressure (hypertension), and high cholesterol. These are features of metabolic syndrome and increase your risk for stroke, diabetes, and heart disease. What are the causes? This condition may be caused by:  Drinking too much alcohol.  Poor nutrition.  Obesity.  Cushing's syndrome.  Diabetes.  High cholesterol.  Certain drugs.  Poisons.  Some viral infections.  Pregnancy. What increases the risk? You are more likely to develop this condition if you:  Abuse alcohol.  Are overweight.  Have diabetes.  Have hepatitis.  Have a high triglyceride level.  Are pregnant. What are the signs or symptoms? Fatty liver disease often does not cause symptoms. If symptoms do develop, they can include:  Fatigue.  Weakness.  Weight loss.  Confusion.  Abdominal pain.  Nausea and vomiting.  Yellowing of your skin and the white parts of your eyes (jaundice).  Itchy skin. How is this diagnosed? This condition may be diagnosed by:  A physical exam and medical history.  Blood tests.  Imaging tests, such as an ultrasound, CT scan, or MRI.  A liver biopsy. A small sample of liver tissue is removed using a needle. The sample is then  looked at under a microscope. How is this treated? Fatty liver disease is often caused by other health conditions. Treatment for fatty liver may involve medicines and lifestyle changes to manage conditions such as:  Alcoholism.  High cholesterol.  Diabetes.  Being overweight or obese. Follow these instructions at home:   Do not drink alcohol. If you have trouble quitting, ask your health care provider how to safely quit with the help of medicine or a supervised program. This is important to keep your condition from getting worse.  Eat a healthy diet as told by your health care provider. Ask your health care provider about working with a diet and nutrition specialist (dietitian) to develop an eating plan.  Exercise regularly. This can help you lose weight and control your cholesterol and diabetes. Talk to your health care provider about an exercise plan and which activities are best for you.  Take over-the-counter and prescription medicines only as told by your health care provider.  Keep all follow-up visits as told by your health care provider. This is important. Contact a health care provider if: You have trouble controlling your:  Blood sugar. This is especially important if you have diabetes.  Cholesterol.  Drinking of alcohol. Get help right away if:  You have abdominal pain.  You have jaundice.  You have nausea and vomiting.  You vomit blood or material that looks like coffee grounds.  You have stools that are black, tar-like, or bloody. Summary  Fatty liver disease develops when too much fat builds up in the cells of your liver.  Fatty liver disease often causes  no symptoms or problems. However, over time, fatty liver can cause inflammation that may lead to more serious liver problems, such as scarring of the liver (cirrhosis).  You are more likely to develop this condition if you abuse alcohol, are pregnant, are overweight, have diabetes, have hepatitis, or  have high triglyceride levels.  Contact your health care provider if you have trouble controlling your weight, blood sugar, cholesterol, or drinking of alcohol. This information is not intended to replace advice given to you by your health care provider. Make sure you discuss any questions you have with your health care provider. Document Released: 08/13/2005 Document Revised: 04/06/2017 Document Reviewed: 04/06/2017 Elsevier Interactive Patient Education  2019 [employer].

## 2018-11-30 LAB — HEPATITIS PANEL, ACUTE
Hep A IgM: NONREACTIVE
Hep B C IgM: NONREACTIVE
Hepatitis B Surface Ag: NONREACTIVE
Hepatitis C Ab: NONREACTIVE
SIGNAL TO CUT-OFF: 0.03 (ref <1.00)

## 2018-11-30 LAB — COMPREHENSIVE METABOLIC PANEL
AG Ratio: 1.6 (calc) (ref 1.0–2.5)
ALT: 27 U/L (ref 6–29)
AST: 18 U/L (ref 10–35)
Albumin: 4.4 g/dL (ref 3.6–5.1)
Alkaline phosphatase (APISO): 79 U/L (ref 31–125)
BUN: 20 mg/dL (ref 7–25)
CO2: 26 mmol/L (ref 20–32)
Calcium: 9.2 mg/dL (ref 8.6–10.2)
Chloride: 99 mmol/L (ref 98–110)
Creat: 0.93 mg/dL (ref 0.50–1.10)
Globulin: 2.8 g/dL (calc) (ref 1.9–3.7)
Glucose, Bld: 155 mg/dL — ABNORMAL HIGH (ref 65–139)
Potassium: 4.1 mmol/L (ref 3.5–5.3)
Sodium: 136 mmol/L (ref 135–146)
Total Bilirubin: 0.2 mg/dL (ref 0.2–1.2)
Total Protein: 7.2 g/dL (ref 6.1–8.1)

## 2018-12-08 DIAGNOSIS — K760 Fatty (change of) liver, not elsewhere classified: Secondary | ICD-10-CM | POA: Insufficient documentation

## 2018-12-11 DIAGNOSIS — E119 Type 2 diabetes mellitus without complications: Secondary | ICD-10-CM | POA: Insufficient documentation

## 2018-12-27 ENCOUNTER — Ambulatory Visit: Payer: Medicaid Other | Admitting: Podiatry

## 2018-12-27 ENCOUNTER — Other Ambulatory Visit: Payer: Self-pay

## 2018-12-27 ENCOUNTER — Encounter: Payer: Self-pay | Admitting: Podiatry

## 2018-12-27 ENCOUNTER — Ambulatory Visit (INDEPENDENT_AMBULATORY_CARE_PROVIDER_SITE_OTHER): Payer: Medicaid Other

## 2018-12-27 DIAGNOSIS — G629 Polyneuropathy, unspecified: Secondary | ICD-10-CM | POA: Diagnosis not present

## 2018-12-27 DIAGNOSIS — Z79899 Other long term (current) drug therapy: Secondary | ICD-10-CM

## 2018-12-27 DIAGNOSIS — M722 Plantar fascial fibromatosis: Secondary | ICD-10-CM | POA: Diagnosis not present

## 2018-12-27 DIAGNOSIS — M5416 Radiculopathy, lumbar region: Secondary | ICD-10-CM

## 2018-12-29 LAB — HEPATIC FUNCTION PANEL
AG Ratio: 1.7 (calc) (ref 1.0–2.5)
ALT: 17 U/L (ref 6–29)
AST: 17 U/L (ref 10–35)
Albumin: 4.5 g/dL (ref 3.6–5.1)
Alkaline phosphatase (APISO): 62 U/L (ref 31–125)
Bilirubin, Direct: 0.1 mg/dL (ref 0.0–0.2)
Globulin: 2.7 g/dL (calc) (ref 1.9–3.7)
Indirect Bilirubin: 0.1 mg/dL (calc) — ABNORMAL LOW (ref 0.2–1.2)
Total Bilirubin: 0.2 mg/dL (ref 0.2–1.2)
Total Protein: 7.2 g/dL (ref 6.1–8.1)

## 2018-12-31 NOTE — Progress Notes (Signed)
   Subjective: 48 y.o. female presenting today as a new patient with a chief complaint of possible nail fungus of the nails 1-5 bilaterally that has been ongoing for the past few years. She also complains of burning pain of the bilateral feet that has been gradually worsening over the past 1-2 years. Walking increases the pain and sometimes causes swelling. She has been taking Gabapentin prescribed by her PCP. Patient is here for further evaluation and treatment.   Past Medical History:  Diagnosis Date  . Abscess   . Anxiety   . Chronic back pain   . Chronic fatigue   . Chronic neck pain   . Depression   . Dizziness   . Headache   . Occipital neuralgia   . Sciatica     Objective: Physical Exam General: The patient is alert and oriented x3 in no acute distress.  Dermatology: Hyperkeratotic, discolored, thickened, onychodystrophy of nails noted 1-5 of bilateral feet. Skin is warm, dry and supple bilateral lower extremities. Negative for open lesions or macerations.  Vascular: Palpable pedal pulses bilaterally. No edema or erythema noted. Capillary refill within normal limits.  Neurological: Epicritic and protective threshold grossly intact bilaterally.   Musculoskeletal Exam: Range of motion within normal limits to all pedal and ankle joints bilateral. Muscle strength 5/5 in all groups bilateral.   Radiographic Exam:  Normal osseous mineralization. Joint spaces preserved. No fracture/dislocation/boney destruction.    Assessment: #1 Onychomycosis nails 1-5 bilateral #2 DM with peripheral polyneuropathy #3 Lumbar radiculopathy BLE  Plan of Care:  #1 Patient was evaluated. X-Rays reviewed.  #2 Continue taking Gabapentin as directed by PCP.  #3 Explained that patient's symptoms are likely coming from lumbar-sacral spine.  #4 Recommended good shoe gear.  #5 Order for LFT placed today. Office will call patient with results. If normal, we will prescribe Lamisil. If LFT is elevated,  laser treatment is recommended for toenails.  #6 Return to clinic as needed.    Edrick Kins, DPM [facility]  Dr. Edrick Kins, [address]                                        [place], [place] [address]                Office [phone]  Fax [phone]

## 2019-01-02 ENCOUNTER — Telehealth: Payer: Self-pay | Admitting: Podiatry

## 2019-01-02 NOTE — Telephone Encounter (Signed)
Asking about labs she had last Friday 6/19 waiting for them to be reviewed, patient was informed that she would get a call when this is complete. Please call when possible.

## 2019-01-03 ENCOUNTER — Other Ambulatory Visit: Payer: Self-pay | Admitting: Podiatry

## 2019-01-03 MED ORDER — TERBINAFINE HCL 250 MG PO TABS: 250.0000 mg | ORAL_TABLET | Freq: Every day | ORAL | 0 refills | Status: DC

## 2019-01-03 MED ORDER — AMMONIUM LACTATE 12 % EX LOTN: 1.0000 "application " | TOPICAL_LOTION | Freq: Two times a day (BID) | CUTANEOUS | 3 refills | Status: DC

## 2019-01-03 NOTE — Progress Notes (Signed)
Spoke with patient regarding LFT results. Rx Lamisil and Lac-Hydrin sent to pharmacy. RTC PRN.   - Dr. Amalia Hailey

## 2019-01-26 DIAGNOSIS — M5416 Radiculopathy, lumbar region: Secondary | ICD-10-CM | POA: Insufficient documentation

## 2019-04-22 DIAGNOSIS — E1169 Type 2 diabetes mellitus with other specified complication: Secondary | ICD-10-CM | POA: Insufficient documentation

## 2019-04-22 DIAGNOSIS — E785 Hyperlipidemia, unspecified: Secondary | ICD-10-CM | POA: Insufficient documentation

## 2019-04-24 ENCOUNTER — Encounter (INDEPENDENT_AMBULATORY_CARE_PROVIDER_SITE_OTHER): Payer: Self-pay | Admitting: *Deleted

## 2019-06-04 ENCOUNTER — Ambulatory Visit (INDEPENDENT_AMBULATORY_CARE_PROVIDER_SITE_OTHER): Payer: Medicaid Other | Admitting: Nurse Practitioner

## 2019-06-06 ENCOUNTER — Ambulatory Visit (INDEPENDENT_AMBULATORY_CARE_PROVIDER_SITE_OTHER): Payer: Medicaid Other | Admitting: Nurse Practitioner

## 2019-06-08 ENCOUNTER — Emergency Department (HOSPITAL_COMMUNITY)
Admission: EM | Admit: 2019-06-08 | Discharge: 2019-06-08 | Disposition: A | Discharge status: Home / Self Care | Payer: Medicaid Other

## 2019-06-08 ENCOUNTER — Other Ambulatory Visit: Payer: Self-pay

## 2019-06-12 ENCOUNTER — Other Ambulatory Visit: Payer: Self-pay

## 2019-06-12 DIAGNOSIS — Z20822 Contact with and (suspected) exposure to covid-19: Secondary | ICD-10-CM

## 2019-06-14 LAB — NOVEL CORONAVIRUS, NAA: SARS-CoV-2, NAA: NOT DETECTED

## 2019-06-18 ENCOUNTER — Encounter (HOSPITAL_COMMUNITY): Payer: Self-pay

## 2019-06-18 ENCOUNTER — Emergency Department (HOSPITAL_COMMUNITY)
Admission: EM | Admit: 2019-06-18 | Discharge: 2019-06-18 | Disposition: A | Discharge status: Home / Self Care | Payer: Medicaid Other | Attending: Emergency Medicine | Admitting: Emergency Medicine

## 2019-06-18 ENCOUNTER — Emergency Department (HOSPITAL_COMMUNITY): Payer: Medicaid Other

## 2019-06-18 ENCOUNTER — Other Ambulatory Visit: Payer: Self-pay

## 2019-06-18 DIAGNOSIS — Z79899 Other long term (current) drug therapy: Secondary | ICD-10-CM | POA: Diagnosis not present

## 2019-06-18 DIAGNOSIS — R42 Dizziness and giddiness: Secondary | ICD-10-CM | POA: Insufficient documentation

## 2019-06-18 DIAGNOSIS — R110 Nausea: Secondary | ICD-10-CM | POA: Diagnosis not present

## 2019-06-18 LAB — CBC WITH DIFFERENTIAL/PLATELET
Abs Immature Granulocytes: 0.05 10*3/uL (ref 0.00–0.07)
Basophils Absolute: 0 10*3/uL (ref 0.0–0.1)
Basophils Relative: 0 %
Eosinophils Absolute: 0.1 10*3/uL (ref 0.0–0.5)
Eosinophils Relative: 1 %
HCT: 41.6 % (ref 36.0–46.0)
Hemoglobin: 12.8 g/dL (ref 12.0–15.0)
Immature Granulocytes: 1 %
Lymphocytes Relative: 21 %
Lymphs Abs: 1.4 10*3/uL (ref 0.7–4.0)
MCH: 28.6 pg (ref 26.0–34.0)
MCHC: 30.8 g/dL (ref 30.0–36.0)
MCV: 92.9 fL (ref 80.0–100.0)
Monocytes Absolute: 0.6 10*3/uL (ref 0.1–1.0)
Monocytes Relative: 9 %
Neutro Abs: 4.6 10*3/uL (ref 1.7–7.7)
Neutrophils Relative %: 68 %
Platelets: 242 10*3/uL (ref 150–400)
RBC: 4.48 MIL/uL (ref 3.87–5.11)
RDW: 14.5 % (ref 11.5–15.5)
WBC: 6.7 10*3/uL (ref 4.0–10.5)
nRBC: 0 % (ref 0.0–0.2)

## 2019-06-18 LAB — COMPREHENSIVE METABOLIC PANEL
ALT: 41 U/L (ref 0–44)
AST: 28 U/L (ref 15–41)
Albumin: 4.4 g/dL (ref 3.5–5.0)
Alkaline Phosphatase: 49 U/L (ref 38–126)
Anion gap: 11 (ref 5–15)
BUN: 16 mg/dL (ref 6–20)
CO2: 22 mmol/L (ref 22–32)
Calcium: 9.3 mg/dL (ref 8.9–10.3)
Chloride: 103 mmol/L (ref 98–111)
Creatinine, Ser: 0.96 mg/dL (ref 0.44–1.00)
GFR calc Af Amer: >60 mL/min (ref >60)
GFR calc non Af Amer: >60 mL/min (ref >60)
Glucose, Bld: 91 mg/dL (ref 70–99)
Potassium: 3.8 mmol/L (ref 3.5–5.1)
Sodium: 136 mmol/L (ref 135–145)
Total Bilirubin: 0.4 mg/dL (ref 0.3–1.2)
Total Protein: 7.5 g/dL (ref 6.5–8.1)

## 2019-06-18 LAB — HCG, QUANTITATIVE, PREGNANCY: hCG, Beta Chain, Quant, S: 1 m[IU]/mL (ref <5)

## 2019-06-18 MED ORDER — ONDANSETRON 4 MG PO TBDP: 4.0000 mg | ORAL_TABLET | Freq: Three times a day (TID) | ORAL | 0 refills | Status: DC | PRN

## 2019-06-18 MED ORDER — ONDANSETRON HCL 4 MG/2ML IJ SOLN
4.0000 mg | Freq: Once | INTRAMUSCULAR | Status: AC
  Administered 2019-06-18: 22:00:00 4 mg via INTRAVENOUS
  Filled 2019-06-18: qty 2

## 2019-06-18 MED ORDER — SODIUM CHLORIDE 0.9 % IV BOLUS
1000.0000 mL | Freq: Once | INTRAVENOUS | Status: AC
  Administered 2019-06-18: 1000 mL via INTRAVENOUS

## 2019-06-18 NOTE — ED Notes (Signed)
Pt in absolutely no visible distress having enjoyed crackers and soda

## 2019-06-18 NOTE — ED Provider Notes (Signed)
[facility] EMERGENCY DEPARTMENT Provider Note   CSN: [id number] Arrival date & time: 06/18/19  1415     History   Chief Complaint Chief Complaint  Patient presents with  . Covid Positive    HPI Destiny Beck is a 48 y.o. female with PMH significant for type II DM and BPPV who tested positive for COVID-19 on 06/12/2019 who presents to the ED for worsening dizziness and nausea symptoms.  Patient reports that she initially was feeling particularly short of breath after her initial diagnosis, but after receiving antibiotics and albuterol treatments, she feels significantly improved from shortness of breath standpoint.  However, she now has a 2-day history of profound dizziness with associated nausea.  She reports that it is typically provoked with movement or by looking at her phone.  She admits that she likely is not drinking enough water.  She states that she was diagnosed with BPPV several years ago and that meclizine made her particularly fatigued and was unhelpful.  She reports that it ultimately resolved spontaneously and she has not had issues with it until her new infection.  She reports that the episodes last approximately 2 minutes before resolving, but they are near constant and persist all day long with abrupt return to the ED provider.  While she endorses dizziness and ataxia, she denies any hearing loss, tinnitus, headache, numbness or tingling, weakness, or any other neurologic symptoms.     HPI  Past Medical History:  Diagnosis Date  . Abscess   . Anxiety   . Chronic back pain   . Chronic fatigue   . Chronic neck pain   . Depression   . Dizziness   . Headache   . Occipital neuralgia   . Sciatica     Patient Active Problem List   Diagnosis Date Noted  . Constipation 11/06/2018  . Bloating 11/06/2018  . Abdominal distention 11/06/2018  . Heme positive stool 07/26/2018  . Absolute anemia 07/26/2018    Past Surgical History:  Procedure Laterality Date  .  BIOPSY  08/04/2018   Procedure: BIOPSY;  Surgeon: Rogene Houston, MD;  [facility];  Service: Endoscopy;;  gastric   . CESAREAN SECTION     x2  . COLONOSCOPY WITH PROPOFOL N/A 08/04/2018   Procedure: COLONOSCOPY WITH PROPOFOL;  Surgeon: Rogene Houston, MD;  [facility];  Service: Endoscopy;  Laterality: N/A;  11.50  . ESOPHAGOGASTRODUODENOSCOPY (EGD) WITH PROPOFOL N/A 08/04/2018   Procedure: ESOPHAGOGASTRODUODENOSCOPY (EGD) WITH PROPOFOL;  Surgeon: Rogene Houston, MD;  [facility]: [facility];  Service: Endoscopy;  Laterality: N/A;  . TUBAL LIGATION       OB History    Gravida  3   Para      Term      Preterm      AB      Living  3     SAB      TAB      Ectopic      Multiple      Live Births               Home Medications    Prior to Admission medications   Medication Sig Start Date End Date Taking? Authorizing Provider  ammonium lactate (AMLACTIN) 12 % lotion Apply 1 application topically 2 (two) times daily. 01/03/19   Edrick Kins, DPM  buPROPion (WELLBUTRIN XL) 300 MG 24 hr tablet Take 300 mg by mouth daily.    [provider]  escitalopram (LEXAPRO) 20 MG tablet Take 20 mg by mouth daily.     [provider]  famotidine (PEPCID) 20 MG tablet Take 1 tablet (20 mg total) by mouth 2 (two) times daily. Patient not taking: Reported on 12/27/2018 08/04/18   Rogene Houston, MD  gabapentin (NEURONTIN) 400 MG capsule Take 600 mg by mouth 3 (three) times daily.     [provider]  HYDROcodone-acetaminophen (NORCO/VICODIN) 5-325 MG tablet Take 1 tablet by mouth every 6 (six) hours as needed for moderate pain. Takes 1 every 6 hrs.    [provider]  lubiprostone (AMITIZA) 24 MCG capsule Take 1 capsule (24 mcg total) by mouth 2 (two) times daily with a meal. Patient not taking: Reported on 12/27/2018 11/06/18   Butch Penny, NP  metFORMIN (GLUCOPHAGE) 500 MG tablet Take by mouth. 12/11/18   [provider]  methocarbamol (ROBAXIN) 500 MG tablet Take 500 mg by mouth 2 (two) times a day.    [provider]  MIRENA, 52 MG, 20 MCG/24HR IUD TO BE INSERTED ONE TIME BY PRESCRIBER. ROUTE INTRAUTERINE. 09/12/18   [provider]  NON FORMULARY On a Prednisone dose back for neck pain.    [provider]  ondansetron (ZOFRAN ODT) 4 MG disintegrating tablet Take 1 tablet (4 mg total) by mouth every 8 (eight) hours as needed for nausea or vomiting. 06/18/19   Corena Herter, PA-C  terbinafine (LAMISIL) 250 MG tablet Take 1 tablet (250 mg total) by mouth daily. 01/03/19   Edrick Kins, DPM  venlafaxine XR (EFFEXOR-XR) 75 MG 24 hr capsule Take by mouth. 12/05/18 02/03/19  [provider]    Family History No family history on file.  Social History Social History   Tobacco Use  . Smoking status: Former Smoker    Packs/day: 0.50    Years: 30.00    Pack years: 15.00    Types: Cigarettes    Quit date: 01/07/2017    Years since quitting: 2.4  . Smokeless tobacco: Never Used  Substance Use Topics  . Alcohol use: Yes    Comment: Occ  . Drug use: Not Currently    Types: Marijuana     Allergies   Patient has no known allergies.   Review of Systems Review of Systems   Physical Exam Updated Vital Signs BP (!) 130/105 (BP Location: Right Arm)   Pulse 92   Temp 98.7 F (37.1 C) (Oral)   Resp 16   Ht 5' (1.524 m)   Wt 78 kg   SpO2 97%   BMI 33.59 kg/m   Physical Exam Vitals signs and nursing note reviewed. Exam conducted with a chaperone present.  Constitutional:      Appearance: Normal appearance.  HENT:     Head: Normocephalic and atraumatic.  Eyes:     General: No scleral icterus.    Extraocular Movements: Extraocular movements intact.     Conjunctiva/sclera: Conjunctivae normal.     Pupils: Pupils are equal, round, and reactive to light.  Cardiovascular:     Rate and Rhythm: Normal rate and regular rhythm.     Pulses: Normal  pulses.     Heart sounds: Normal heart sounds.  Pulmonary:     Effort: Pulmonary effort is normal.     Breath sounds: Normal breath sounds.  Abdominal:     General: Abdomen is flat.     Palpations: Abdomen is soft.  Skin:    General: Skin is dry.  Capillary Refill: Capillary refill takes less than 2 seconds.  Neurological:     Mental Status: She is alert.     GCS: GCS eye subscore is 4. GCS verbal subscore is 5. GCS motor subscore is 6.     Comments: CN II through XII grossly intact.  Strength intact.  Full range of motion.  Negative cerebellar and Romberg exams.  Positive head impulse testing.  Positive Dix-Hallpike testing, particular when looking left.  No nystagmus noted on exam.  Reflexes intact.  PERRLA and EOM.  Psychiatric:        Mood and Affect: Mood normal.        Behavior: Behavior normal.        Thought Content: Thought content normal.      ED Treatments / Results  Labs (all labs ordered are listed, but only abnormal results are displayed) Labs Reviewed  CBC WITH DIFFERENTIAL/PLATELET  COMPREHENSIVE METABOLIC PANEL  HCG, QUANTITATIVE, PREGNANCY    EKG EKG Interpretation  Date/Time:  Monday June 18 2019 15:08:16 EST Ventricular Rate:  94 PR Interval:  146 QRS Duration: 84 QT Interval:  354 QTC Calculation: 442 R Axis:   10 Text Interpretation: Normal sinus rhythm Possible Left atrial enlargement Borderline ECG since last tracing no significant change Confirmed by Noemi Chapel [phone]) on 06/18/2019 8:33:51 PM   Radiology Ct Head Wo Contrast  Result Date: 06/18/2019 CLINICAL DATA:  Dizziness and nausea EXAM: CT HEAD WITHOUT CONTRAST TECHNIQUE: Contiguous axial images were obtained from the base of the skull through the vertex without intravenous contrast. COMPARISON:  None. FINDINGS: Brain: There is no mass, hemorrhage or extra-axial collection. The size and configuration of the ventricles and extra-axial CSF spaces are normal. The brain parenchyma is  normal, without acute or chronic infarction. Vascular: No abnormal hyperdensity of the major intracranial arteries or dural venous sinuses. No intracranial atherosclerosis. Skull: The visualized skull base, calvarium and extracranial soft tissues are normal. Sinuses/Orbits: No fluid levels or advanced mucosal thickening of the visualized paranasal sinuses. No mastoid or middle ear effusion. The orbits are normal. IMPRESSION: Normal head CT. Electronically Signed   By: Ulyses Jarred M.D.   On: 06/18/2019 20:07    Procedures Procedures (including critical care time)  Medications Ordered in ED Medications  sodium chloride 0.9 % bolus 1,000 mL (1,000 mLs Intravenous New Bag/Given 06/18/19 1954)  ondansetron (ZOFRAN) injection 4 mg (4 mg Intravenous Given 06/18/19 2135)     Initial Impression / Assessment and Plan / ED Course  I have reviewed the triage vital signs and the nursing notes.  Pertinent labs & imaging results that were available during my care of the patient were reviewed by me and considered in my medical decision making (see chart for details).        CT without contrast demonstrated no acute abnormalities.  Her lab work was reviewed and is all within normal meds.  Patient was very reassured by today's comprehensive evaluation.  Patient suspects that her lightheadedness symptoms are possibly related to dehydration and she believes she has not been drinking adequate fluids.  Patient symptoms improved with IV Zofran and she feels capable for discharge with Zofran ODT.  Recommend that she follow-up with her PCP or seek consult with neurology should her ataxic symptoms and dizziness continue to persist despite Zofran and increased hydration.  I have also provided her with instructions on how to perform an Epley maneuver should BPPV be the cause of her symptoms.  Patient was able to ambulate without  ataxic gait and without provoking any dizziness.  She was also able to turn her head without  provoking further dizziness.  After receiving Zofran IV, patient shows significantly improved.  Low suspicion for Mnire's disease given lack of hearing impairment or tinnitus.  Low suspicion for acoustic neuroma given improvement with Zofran.  Patient denies any neurologic deficit outside of her reported dizziness and ataxia.  Her cerebellar exam was performed and was normal.  Discussed strict return precautions. All of the evaluation and work-up results were discussed with the patient and any family at bedside. They were provided opportunity to ask any additional questions and have none at this time. They have expressed understanding of verbal discharge instructions as well as return precautions and are agreeable to the plan.    Final Clinical Impressions(s) / ED Diagnoses   Final diagnoses:  Lightheadedness    ED Discharge Orders         Ordered    ondansetron (ZOFRAN ODT) 4 MG disintegrating tablet  Every 8 hours PRN     06/18/19 2213           Corena Herter, PA-C 06/18/19 2217    Milton Ferguson, MD 06/22/19 [phone]

## 2019-06-18 NOTE — ED Notes (Signed)
PA in to reassess 

## 2019-06-18 NOTE — Discharge Instructions (Addendum)
Please read the attachment on how to perform an Epley maneuver.  Please follow-up with your PCP regarding today's visit.  I have prescribed you Zofran ODT for your nausea symptoms.  Please continue isolation guidelines given your COVID-19 diagnosis.  Return to the ED or seek medical attention for any new or worsening symptoms.

## 2019-06-18 NOTE — ED Triage Notes (Signed)
Pt + Covid on 12/1 at her PCP at [facility]. Pt c/o dizziness and nausea which got worse over the last couple of days.

## 2019-06-18 NOTE — ED Notes (Signed)
Pt reports hx of vertigo  Noncompliance with meds because they make her sleepy  Dx'd with covid Tuesday after having son and family for Thanksgiving- son later tested positive for covid as he works at [employer] Here for dizziness- pt is smiling, NAD and speaking in complete sentences  IV established and labs drawn

## 2019-06-18 NOTE — ED Notes (Signed)
To CT

## 2019-12-10 ENCOUNTER — Other Ambulatory Visit: Payer: Self-pay

## 2019-12-10 ENCOUNTER — Emergency Department (HOSPITAL_COMMUNITY)
Admission: EM | Admit: 2019-12-10 | Discharge: 2019-12-10 | Disposition: A | Discharge status: Home / Self Care | Payer: Medicaid Other | Attending: Emergency Medicine | Admitting: Emergency Medicine

## 2019-12-10 ENCOUNTER — Encounter (HOSPITAL_COMMUNITY): Payer: Self-pay

## 2019-12-10 DIAGNOSIS — R42 Dizziness and giddiness: Secondary | ICD-10-CM | POA: Insufficient documentation

## 2019-12-10 DIAGNOSIS — Z87891 Personal history of nicotine dependence: Secondary | ICD-10-CM | POA: Diagnosis not present

## 2019-12-10 LAB — CBC WITH DIFFERENTIAL/PLATELET
Abs Immature Granulocytes: 0.03 10*3/uL (ref 0.00–0.07)
Basophils Absolute: 0 10*3/uL (ref 0.0–0.1)
Basophils Relative: 0 %
Eosinophils Absolute: 0.2 10*3/uL (ref 0.0–0.5)
Eosinophils Relative: 3 %
HCT: 41.6 % (ref 36.0–46.0)
Hemoglobin: 13 g/dL (ref 12.0–15.0)
Immature Granulocytes: 0 %
Lymphocytes Relative: 22 %
Lymphs Abs: 1.6 10*3/uL (ref 0.7–4.0)
MCH: 29 pg (ref 26.0–34.0)
MCHC: 31.3 g/dL (ref 30.0–36.0)
MCV: 92.9 fL (ref 80.0–100.0)
Monocytes Absolute: 0.5 10*3/uL (ref 0.1–1.0)
Monocytes Relative: 7 %
Neutro Abs: 5 10*3/uL (ref 1.7–7.7)
Neutrophils Relative %: 68 %
Platelets: 264 10*3/uL (ref 150–400)
RBC: 4.48 MIL/uL (ref 3.87–5.11)
RDW: 14.6 % (ref 11.5–15.5)
WBC: 7.4 10*3/uL (ref 4.0–10.5)
nRBC: 0 % (ref 0.0–0.2)

## 2019-12-10 LAB — BASIC METABOLIC PANEL
Anion gap: 11 (ref 5–15)
BUN: 18 mg/dL (ref 6–20)
CO2: 26 mmol/L (ref 22–32)
Calcium: 9.7 mg/dL (ref 8.9–10.3)
Chloride: 100 mmol/L (ref 98–111)
Creatinine, Ser: 0.82 mg/dL (ref 0.44–1.00)
GFR calc Af Amer: >60 mL/min (ref >60)
GFR calc non Af Amer: >60 mL/min (ref >60)
Glucose, Bld: 98 mg/dL (ref 70–99)
Potassium: 3.8 mmol/L (ref 3.5–5.1)
Sodium: 137 mmol/L (ref 135–145)

## 2019-12-10 LAB — HCG, QUANTITATIVE, PREGNANCY: hCG, Beta Chain, Quant, S: 1 m[IU]/mL (ref <5)

## 2019-12-10 MED ORDER — MECLIZINE HCL 25 MG PO TABS: 25.0000 mg | ORAL_TABLET | Freq: Three times a day (TID) | ORAL | 0 refills | Status: DC | PRN

## 2019-12-10 MED ORDER — SODIUM CHLORIDE 0.9 % IV BOLUS
1000.0000 mL | Freq: Once | INTRAVENOUS | Status: AC
  Administered 2019-12-10: 1000 mL via INTRAVENOUS

## 2019-12-10 MED ORDER — LORAZEPAM 1 MG PO TABS
1.0000 mg | ORAL_TABLET | Freq: Once | ORAL | Status: AC
  Administered 2019-12-10: 1 mg via ORAL
  Filled 2019-12-10: qty 1

## 2019-12-10 MED ORDER — BACITRACIN-NEOMYCIN-POLYMYXIN 400-5-5000 EX OINT
TOPICAL_OINTMENT | Freq: Once | CUTANEOUS | Status: AC
  Administered 2019-12-10: 1 via TOPICAL
  Filled 2019-12-10: qty 1

## 2019-12-10 MED ORDER — MECLIZINE HCL 12.5 MG PO TABS
50.0000 mg | ORAL_TABLET | Freq: Once | ORAL | Status: AC
  Administered 2019-12-10: 50 mg via ORAL
  Filled 2019-12-10: qty 4

## 2019-12-10 NOTE — ED Provider Notes (Signed)
[facility] EMERGENCY DEPARTMENT Provider Note   CSN: [id number] Arrival date & time: 12/10/19  [id number]     History Chief Complaint  Patient presents with  . Dizziness    Destiny Beck is a 49 y.o. female presenting with acute on chronic vertiginous vertigo symptoms which are intermittent but have escalated over the past 3 weeks. She has had vertigo since in her 6's. She endorses difficulty driving at times as she gets sleepy when driving with increased lightheadedness and feels off balance, denies full spinning sensation.  Has difficulty ambulating at times simply walking her dog around the neighborhood or even walking in her apartment, finds she needs to hold onto furniture in order to manage this dizziness.  She denies room spinning,   She has had multiple studies including a brain MRI last year which was unremarkable and is followed by neurology at [facility], Dr. Shela Leff, whom she saw 10 days ago and started her on nortriptylene.  So far, this medicine has not been effective. She is also taking meclizine 25 mg tid and has tried dramamine (not currently taking this) and picked up an otc antihistamine today but still without improvement.  She is also waiting for vestibular rehab to be scheduled for her.  She denies fevers, chills, headache, focal weakness, seizures.    The history is provided by the patient.       Past Medical History:  Diagnosis Date  . Abscess   . Anxiety   . Chronic back pain   . Chronic fatigue   . Chronic neck pain   . Depression   . Dizziness   . Headache   . Occipital neuralgia   . Sciatica     Patient Active Problem List   Diagnosis Date Noted  . Constipation 11/06/2018  . Bloating 11/06/2018  . Abdominal distention 11/06/2018  . Heme positive stool 07/26/2018  . Absolute anemia 07/26/2018    Past Surgical History:  Procedure Laterality Date  . BIOPSY  08/04/2018   Procedure: BIOPSY;  Surgeon: Rogene Houston, MD;  [facility];   Service: Endoscopy;;  gastric   . CESAREAN SECTION     x2  . COLONOSCOPY WITH PROPOFOL N/A 08/04/2018   Procedure: COLONOSCOPY WITH PROPOFOL;  Surgeon: Rogene Houston, MD;  [facility];  Service: Endoscopy;  Laterality: N/A;  11.50  . ESOPHAGOGASTRODUODENOSCOPY (EGD) WITH PROPOFOL N/A 08/04/2018   Procedure: ESOPHAGOGASTRODUODENOSCOPY (EGD) WITH PROPOFOL;  Surgeon: Rogene Houston, MD;  [facility];  Service: Endoscopy;  Laterality: N/A;  . TUBAL LIGATION       OB History    Gravida  3   Para      Term      Preterm      AB      Living  3     SAB      TAB      Ectopic      Multiple      Live Births              History reviewed. No pertinent family history.  Social History   Tobacco Use  . Smoking status: Former Smoker    Packs/day: 0.50    Years: 30.00    Pack years: 15.00    Types: Cigarettes    Quit date: 01/07/2017    Years since quitting: 2.9  . Smokeless tobacco: Never Used  Substance Use Topics  . Alcohol use: Yes    Comment: Occ  .  Drug use: Yes    Types: Marijuana    Home Medications Prior to Admission medications   Medication Sig Start Date End Date Taking? Authorizing Provider  albuterol (VENTOLIN HFA) 108 (90 Base) MCG/ACT inhaler Inhale 1-2 puffs into the lungs every 6 (six) hours as needed for wheezing or shortness of breath.  06/12/19  Yes [provider]  ARIPiprazole (ABILIFY) 5 MG tablet Take 5 mg by mouth at bedtime.  10/30/19 01/28/20 Yes [provider]  buPROPion (WELLBUTRIN XL) 300 MG 24 hr tablet Take 300 mg by mouth daily.   Yes [provider]  COMBIPATCH 0.05-0.14 MG/DAY Place 1 patch onto the skin 2 (two) times a week.  12/05/19  Yes [provider]  cyanocobalamin 1000 MCG tablet Take 1,000 mcg by mouth daily.  03/14/19  Yes [provider]  cyclobenzaprine (FLEXERIL) 10 MG tablet Take 10 mg by mouth 2 (two) times daily as needed for muscle spasms.  09/26/19   Yes [provider]  DEXILANT 60 MG capsule Take 1 capsule by mouth daily. 10/26/19  Yes [provider]  estradiol (ESTRACE) 1 MG tablet Take 1 mg by mouth in the morning.  11/08/19  Yes [provider]  ferrous sulfate 325 (65 FE) MG EC tablet Take 325 mg by mouth daily with breakfast.    Yes [provider]  gabapentin (NEURONTIN) 600 MG tablet Take 600 mg by mouth 3 (three) times daily.    Yes [provider]  hydrOXYzine (ATARAX/VISTARIL) 25 MG tablet Take 25 mg by mouth 3 (three) times daily as needed for anxiety or itching.  09/26/19  Yes [provider]  metFORMIN (GLUCOPHAGE) 500 MG tablet Take 500 mg by mouth 2 (two) times daily with a meal.  12/11/18  Yes [provider]  nortriptyline (PAMELOR) 10 MG capsule Take 30 mg by mouth at bedtime. 11/30/19  Yes [provider]  prazosin (MINIPRESS) 2 MG capsule Take 2 mg by mouth at bedtime. 10/30/19  Yes [provider]  rizatriptan (MAXALT) 10 MG tablet Take 10 mg by mouth as needed for migraine.  12/05/19  Yes [provider]  venlafaxine XR (EFFEXOR-XR) 75 MG 24 hr capsule Take 225 mg by mouth in the morning.  12/05/18 12/10/19 Yes [provider]  meclizine (ANTIVERT) 25 MG tablet Take 1-2 tablets (25-50 mg total) by mouth 3 (three) times daily as needed for dizziness. 12/10/19   Evalee Jefferson, PA-C    Allergies    Ibuprofen  Review of Systems   Review of Systems  Constitutional: Negative for appetite change, chills and fever.  HENT: Negative for congestion and sore throat.   Eyes: Negative.   Respiratory: Negative for chest tightness and shortness of breath.   Cardiovascular: Negative for chest pain.  Gastrointestinal: Negative for abdominal pain, nausea and vomiting.  Genitourinary: Negative.   Musculoskeletal: Negative for arthralgias, joint swelling and neck pain.  Skin: Negative.  Negative for rash and wound.  Neurological: Positive for  dizziness. Negative for tremors, seizures, syncope, weakness, light-headedness, numbness and headaches.  Psychiatric/Behavioral: Negative.     Physical Exam Updated Vital Signs BP (!) 116/92 (BP Location: Left Arm)   Pulse 80   Temp 99 F (37.2 C) (Oral)   Resp 18   Ht 5' (1.524 m)   Wt 74.8 kg   SpO2 100%   BMI 32.22 kg/m   Physical Exam Vitals and nursing note reviewed.  Constitutional:      Appearance: She is well-developed.  HENT:     Head: Normocephalic and atraumatic.  Eyes:     Pupils: Pupils are equal, round, and reactive to light.  Cardiovascular:     Rate and Rhythm: Normal rate.     Heart sounds: Normal heart sounds.  Pulmonary:     Effort: Pulmonary effort is normal.  Abdominal:     Palpations: Abdomen is soft.     Tenderness: There is no abdominal tenderness.  Musculoskeletal:        General: Normal range of motion.     Cervical back: Normal range of motion and neck supple.  Lymphadenopathy:     Cervical: No cervical adenopathy.  Skin:    General: Skin is warm and dry.     Findings: No rash.  Neurological:     Mental Status: She is alert and oriented to person, place, and time.     GCS: GCS eye subscore is 4. GCS verbal subscore is 5. GCS motor subscore is 6.     Sensory: No sensory deficit.     Coordination: Heel to [facility] Test normal. Rapid alternating movements normal.     Gait: Gait normal.     Comments: Normal heel-shin, normal rapid alternating movements. Cranial nerves III-XII intact.  No pronator drift.  Psychiatric:        Speech: Speech normal.        Behavior: Behavior normal.        Thought Content: Thought content normal.     ED Results / Procedures / Treatments   Labs (all labs ordered are listed, but only abnormal results are displayed) Labs Reviewed  CBC WITH DIFFERENTIAL/PLATELET  BASIC METABOLIC PANEL  HCG, QUANTITATIVE, PREGNANCY    EKG EKG Interpretation  Date/Time:  Monday Dec 10 2019 18:45:39 EDT Ventricular Rate:    71 PR Interval:    QRS Duration: 82 QT Interval:  388 QTC Calculation: 422 R Axis:   82 Text Interpretation: Sinus rhythm Baseline wander in lead(s) II V6 No STEMI Confirmed by Nanda Quinton [phone]) on 12/10/2019 6:52:19 PM   Radiology No results found.  Procedures Procedures (including critical care time)  Medications Ordered in ED Medications  sodium chloride 0.9 % bolus 1,000 mL (0 mLs Intravenous Stopped 12/10/19 2147)  meclizine (ANTIVERT) tablet 50 mg (50 mg Oral Given 12/10/19 1947)  LORazepam (ATIVAN) tablet 1 mg (1 mg Oral Given 12/10/19 1947)  neomycin-bacitracin-polymyxin (NEOSPORIN) ointment packet (1 application Topical Given 12/10/19 1952)    ED Course  I have reviewed the triage vital signs and the nursing notes.  Pertinent labs & imaging results that were available during my care of the patient were reviewed by me and considered in my medical decision making (see chart for details).    MDM Rules/Calculators/A&P                      Pt given IV fluids while awaiting lab testing. She was also given meclizine 50 mg along with ativan which did relieve her sx.  Neuro exam normal with no red flag sx for central source of dizziness.  After review of home meds, multiple sedating meds, ativan not felt appropriate for home use.  She was encouraged to increase her meclizine dosing however which may help with sx control.  Encouraged f/u with neuro and to get her vestibular rehab started.  She was given Epley maneuver instructions which she may try as well.  The patient appears reasonably screened and/or stabilized for discharge and I doubt any other  medical condition or other [facility] requiring further screening, evaluation, or treatment in the ED at this time prior to discharge.  Final Clinical Impression(s) / ED Diagnoses Final diagnoses:  Vertigo    Rx / DC Orders ED Discharge Orders         Ordered    meclizine (ANTIVERT) 25 MG tablet  3 times daily PRN     12/10/19 2128            Evalee Jefferson, PA-C 12/10/19 2153    Margette Fast, MD 12/10/19 2320

## 2019-12-10 NOTE — ED Triage Notes (Signed)
Pt to er, pt states that she has a hx of vertigo since her mid 20, states that it started acting up about three weeks ago. States that when she was driving she was getting sleepy, states that she has been feeling like she was going to pass out. Pt states that if she is laying down she is ok, but when she gets up she gets dizzy.  Denies chest pain.  Pt states that currently seeing a neurologist, states that she takes meclizine tid, nortriptyline, dramamine and an antihistamine.

## 2019-12-10 NOTE — Discharge Instructions (Signed)
Follow up with your neurologist as discussed. Your labs and exam are reassuring tonight.  You may increase the meclizine which may give better relief of your symptoms.  Refer to the Epley maneuvers below, but also make sure you get scheduled for your rehab treatments as well.

## 2019-12-10 NOTE — ED Notes (Signed)
Pt in bed, pt reports decreased dizziness.  Denies pain, family at bedside

## 2019-12-12 DIAGNOSIS — M25522 Pain in left elbow: Secondary | ICD-10-CM | POA: Insufficient documentation

## 2019-12-14 DIAGNOSIS — M7712 Lateral epicondylitis, left elbow: Secondary | ICD-10-CM | POA: Insufficient documentation

## 2019-12-14 DIAGNOSIS — G5602 Carpal tunnel syndrome, left upper limb: Secondary | ICD-10-CM | POA: Insufficient documentation

## 2020-08-24 ENCOUNTER — Encounter (HOSPITAL_COMMUNITY): Payer: Self-pay | Admitting: Emergency Medicine

## 2020-08-24 ENCOUNTER — Other Ambulatory Visit: Payer: Self-pay

## 2020-08-24 ENCOUNTER — Emergency Department (HOSPITAL_COMMUNITY)
Admission: EM | Admit: 2020-08-24 | Discharge: 2020-08-24 | Disposition: A | Discharge status: Home / Self Care | Payer: Medicaid Other | Attending: Emergency Medicine | Admitting: Emergency Medicine

## 2020-08-24 DIAGNOSIS — R630 Anorexia: Secondary | ICD-10-CM | POA: Insufficient documentation

## 2020-08-24 DIAGNOSIS — Z7984 Long term (current) use of oral hypoglycemic drugs: Secondary | ICD-10-CM | POA: Diagnosis not present

## 2020-08-24 DIAGNOSIS — R251 Tremor, unspecified: Secondary | ICD-10-CM | POA: Diagnosis not present

## 2020-08-24 DIAGNOSIS — Z87891 Personal history of nicotine dependence: Secondary | ICD-10-CM | POA: Diagnosis not present

## 2020-08-24 DIAGNOSIS — T50995A Adverse effect of other drugs, medicaments and biological substances, initial encounter: Secondary | ICD-10-CM | POA: Diagnosis not present

## 2020-08-24 DIAGNOSIS — R531 Weakness: Secondary | ICD-10-CM | POA: Insufficient documentation

## 2020-08-24 DIAGNOSIS — R110 Nausea: Secondary | ICD-10-CM | POA: Insufficient documentation

## 2020-08-24 DIAGNOSIS — Z79899 Other long term (current) drug therapy: Secondary | ICD-10-CM | POA: Insufficient documentation

## 2020-08-24 DIAGNOSIS — R000 Tachycardia, unspecified: Secondary | ICD-10-CM | POA: Insufficient documentation

## 2020-08-24 DIAGNOSIS — Z789 Other specified health status: Secondary | ICD-10-CM

## 2020-08-24 LAB — CBC
HCT: 43.1 % (ref 36.0–46.0)
Hemoglobin: 14 g/dL (ref 12.0–15.0)
MCH: 29.5 pg (ref 26.0–34.0)
MCHC: 32.5 g/dL (ref 30.0–36.0)
MCV: 90.7 fL (ref 80.0–100.0)
Platelets: 241 10*3/uL (ref 150–400)
RBC: 4.75 MIL/uL (ref 3.87–5.11)
RDW: 13.2 % (ref 11.5–15.5)
WBC: 5.6 10*3/uL (ref 4.0–10.5)
nRBC: 0 % (ref 0.0–0.2)

## 2020-08-24 LAB — BASIC METABOLIC PANEL
Anion gap: 9 (ref 5–15)
BUN: 13 mg/dL (ref 6–20)
CO2: 24 mmol/L (ref 22–32)
Calcium: 9 mg/dL (ref 8.9–10.3)
Chloride: 101 mmol/L (ref 98–111)
Creatinine, Ser: 0.99 mg/dL (ref 0.44–1.00)
GFR, Estimated: >60 mL/min (ref >60)
Glucose, Bld: 104 mg/dL — ABNORMAL HIGH (ref 70–99)
Potassium: 3.7 mmol/L (ref 3.5–5.1)
Sodium: 134 mmol/L — ABNORMAL LOW (ref 135–145)

## 2020-08-24 MED ORDER — LORAZEPAM 0.5 MG PO TABS: 0.5000 mg | ORAL_TABLET | Freq: Three times a day (TID) | ORAL | 0 refills | Status: DC | PRN

## 2020-08-24 NOTE — ED Notes (Signed)
Cool wash cloth placed on pt forehead, breathing exercises taught for reduction of stimulation. lights dimmed.

## 2020-08-24 NOTE — ED Triage Notes (Signed)
Pt to the ED with c/o muscle weakness and tremors in arms and legs. Pt states her head feels fuzzy and her legs feel tight.  Pt states she recently stopped taking adderall 20 mg/ day and began taking Vyvanze 50 mg/ day.  Pt is also complaining of neck pain.

## 2020-08-24 NOTE — Discharge Instructions (Addendum)
I think the feeling bad is likely due to the new medication.  The Ativan can help with the symptoms.  Talk with your doctor about possible changing of the dose tomorrow.

## 2020-08-24 NOTE — ED Provider Notes (Signed)
[facility] EMERGENCY DEPARTMENT Provider Note   CSN: [id number] Arrival date & time: 08/24/20  1544     History Chief Complaint  Patient presents with  . Weakness    Destiny Beck is a 50 y.o. female.  HPI Patient presents with weakness tremors and shakiness.  States that around a week ago she switched from Adderall 20 mg to Vyvanse 50 mg.  States the Adderall seemed to not be working.  A day or 2 later began to feel worse.  States she has had a decreased appetite.  Mild nausea.  No fevers or chills.  Denies possibility of pregnancy.  No suicidal or homicidal thoughts.    Past Medical History:  Diagnosis Date  . Abscess   . Anxiety   . Chronic back pain   . Chronic fatigue   . Chronic neck pain   . Depression   . Dizziness   . Headache   . Occipital neuralgia   . Sciatica     Patient Active Problem List   Diagnosis Date Noted  . Constipation 11/06/2018  . Bloating 11/06/2018  . Abdominal distention 11/06/2018  . Heme positive stool 07/26/2018  . Absolute anemia 07/26/2018    Past Surgical History:  Procedure Laterality Date  . BIOPSY  08/04/2018   Procedure: BIOPSY;  Surgeon: Rogene Houston, MD;  [facility];  Service: Endoscopy;;  gastric   . CESAREAN SECTION     x2  . COLONOSCOPY WITH PROPOFOL N/A 08/04/2018   Procedure: COLONOSCOPY WITH PROPOFOL;  Surgeon: Rogene Houston, MD;  [facility];  Service: Endoscopy;  Laterality: N/A;  11.50  . ESOPHAGOGASTRODUODENOSCOPY (EGD) WITH PROPOFOL N/A 08/04/2018   Procedure: ESOPHAGOGASTRODUODENOSCOPY (EGD) WITH PROPOFOL;  Surgeon: Rogene Houston, MD;  [facility];  Service: Endoscopy;  Laterality: N/A;  . TUBAL LIGATION       OB History    Gravida  3   Para      Term      Preterm      AB      Living  3     SAB      IAB      Ectopic      Multiple      Live Births              History reviewed. No pertinent family history.  Social History   Tobacco  Use  . Smoking status: Former Smoker    Packs/day: 0.50    Years: 30.00    Pack years: 15.00    Types: Cigarettes    Quit date: 01/07/2017    Years since quitting: 3.6  . Smokeless tobacco: Never Used  Vaping Use  . Vaping Use: Never used  Substance Use Topics  . Alcohol use: Yes    Comment: Occ  . Drug use: Yes    Types: Marijuana    Comment: occ    Home Medications Prior to Admission medications   Medication Sig Start Date End Date Taking? Authorizing Provider  LORazepam (ATIVAN) 0.5 MG tablet Take 1 tablet (0.5 mg total) by mouth every 8 (eight) hours as needed for anxiety. 08/24/20  Yes Davonna Belling, MD  albuterol (VENTOLIN HFA) 108 (90 Base) MCG/ACT inhaler Inhale 1-2 puffs into the lungs every 6 (six) hours as needed for wheezing or shortness of breath.  06/12/19   [provider]  ARIPiprazole (ABILIFY) 5 MG tablet Take 5 mg by mouth at bedtime.  10/30/19 01/28/20  [provider]  buPROPion (WELLBUTRIN XL) 300 MG 24 hr tablet Take 300 mg by mouth daily.    [provider]  COMBIPATCH 0.05-0.14 MG/DAY Place 1 patch onto the skin 2 (two) times a week.  12/05/19   [provider]  cyanocobalamin 1000 MCG tablet Take 1,000 mcg by mouth daily.  03/14/19   [provider]  cyclobenzaprine (FLEXERIL) 10 MG tablet Take 10 mg by mouth 2 (two) times daily as needed for muscle spasms.  09/26/19   [provider]  DEXILANT 60 MG capsule Take 1 capsule by mouth daily. 10/26/19   [provider]  estradiol (ESTRACE) 1 MG tablet Take 1 mg by mouth in the morning.  11/08/19   [provider]  ferrous sulfate 325 (65 FE) MG EC tablet Take 325 mg by mouth daily with breakfast.     [provider]  gabapentin (NEURONTIN) 600 MG tablet Take 600 mg by mouth 3 (three) times daily.     [provider]  hydrOXYzine (ATARAX/VISTARIL) 25 MG tablet Take 25 mg by mouth 3 (three) times daily as needed for anxiety or  itching.  09/26/19   [provider]  meclizine (ANTIVERT) 25 MG tablet Take 1-2 tablets (25-50 mg total) by mouth 3 (three) times daily as needed for dizziness. 12/10/19   Evalee Jefferson, PA-C  metFORMIN (GLUCOPHAGE) 500 MG tablet Take 500 mg by mouth 2 (two) times daily with a meal.  12/11/18   [provider]  nortriptyline (PAMELOR) 10 MG capsule Take 30 mg by mouth at bedtime. 11/30/19   [provider]  prazosin (MINIPRESS) 2 MG capsule Take 2 mg by mouth at bedtime. 10/30/19   [provider]  rizatriptan (MAXALT) 10 MG tablet Take 10 mg by mouth as needed for migraine.  12/05/19   [provider]  venlafaxine XR (EFFEXOR-XR) 75 MG 24 hr capsule Take 225 mg by mouth in the morning.  12/05/18 12/10/19  [provider]    Allergies    Cymbalta [duloxetine hcl], Tylenol [acetaminophen], and Ibuprofen  Review of Systems   Review of Systems  Constitutional: Positive for appetite change. Negative for fever.  HENT: Negative for congestion.   Respiratory: Negative for shortness of breath.   Cardiovascular: Negative for chest pain.  Gastrointestinal: Positive for nausea.  Genitourinary: Negative for flank pain.  Musculoskeletal: Negative for back pain.  Skin: Negative for rash.  Neurological: Positive for tremors.  Psychiatric/Behavioral: The patient is nervous/anxious.     Physical Exam Updated Vital Signs BP 111/81   Pulse 92   Temp 98.8 F (37.1 C) (Oral)   Resp (!) 24   Ht 5' (1.524 m)   Wt 78.9 kg   SpO2 100%   BMI 33.98 kg/m   Physical Exam Vitals and nursing note reviewed.  HENT:     Head: Atraumatic.  Eyes:     Extraocular Movements: Extraocular movements intact.     Pupils: Pupils are equal, round, and reactive to light.  Cardiovascular:     Rate and Rhythm: Regular rhythm. Tachycardia present.  Pulmonary:     Breath sounds: No wheezing or rhonchi.  Abdominal:     Tenderness: There is no abdominal tenderness.   Musculoskeletal:        General: No tenderness.     Cervical back: Neck supple.  Skin:    General: Skin is warm.     Capillary Refill: Capillary refill takes less than 2 seconds.  Neurological:  Mental Status: She is alert and oriented to person, place, and time.     Comments: Some tremor.  No clonus.  Psychiatric:     Comments: Appears somewhat anxious.     ED Results / Procedures / Treatments   Labs (all labs ordered are listed, but only abnormal results are displayed) Labs Reviewed  BASIC METABOLIC PANEL - Abnormal; Notable for the following components:      Result Value   Sodium 134 (*)    Glucose, Bld 104 (*)    All other components within normal limits  CBC    EKG None  Radiology No results found.  Procedures Procedures   Medications Ordered in ED Medications - No data to display  ED Course  I have reviewed the triage vital signs and the nursing notes.  Pertinent labs & imaging results that were available during my care of the patient were reviewed by me and considered in my medical decision making (see chart for details).    MDM Rules/Calculators/A&P                          Patient with tremors anxiety and feeling bad.  Began with switching from Adderall to Vyvanse.  I think this is likely the cause.  Lab work reassuring.  Only mild hyponatremia.  Will treat with short course of Ativan.  Will follow up tomorrow with her providing doctors for her stimulants Final Clinical Impression(s) / ED Diagnoses Final diagnoses:  Medication intolerance    Rx / DC Orders ED Discharge Orders         Ordered    LORazepam (ATIVAN) 0.5 MG tablet  Every 8 hours PRN        08/24/20 1742           Davonna Belling, MD 08/24/20 2242

## 2020-12-06 IMAGING — MR MR LUMBAR SPINE W/O CM
4 of 5 series · 15 of 48 positions shown · non-contrast
Comparison: Prior radiograph from 06/01/2018.

CLINICAL DATA: Initial evaluation for low back pain radiating into
the lower extremities bilaterally, right greater than left.

EXAM:
MRI LUMBAR SPINE WITHOUT CONTRAST
TECHNIQUE: Multiplanar, multisequence MR imaging of the lumbar spine was
performed. No intravenous contrast was administered.

[Series 3: T2 · sagittal · 4.0mm · 0.71mm/px · 6 of 15 slices shown (1 of 2)]
[im 1/15]
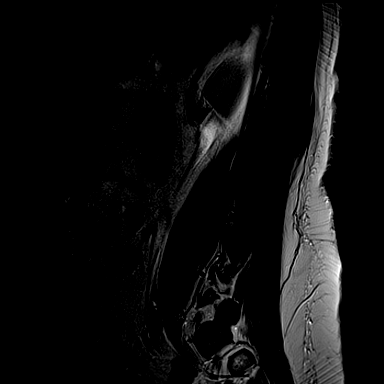
[im 3/15]
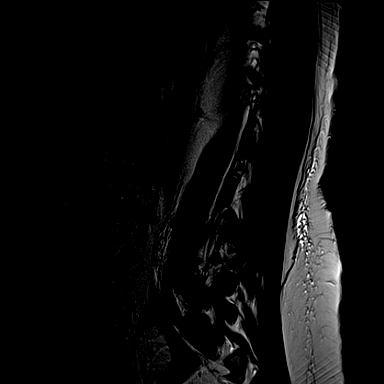
[im 6/15]
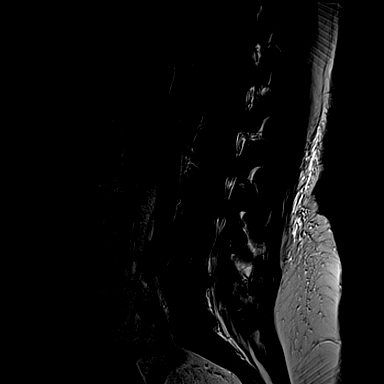
[im 9/15]
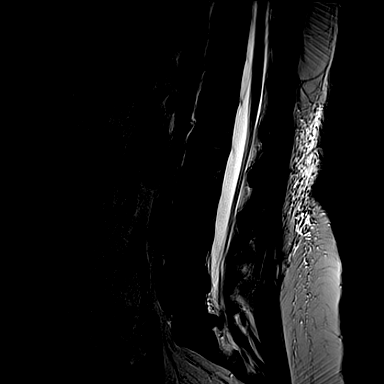
[im 12/15]
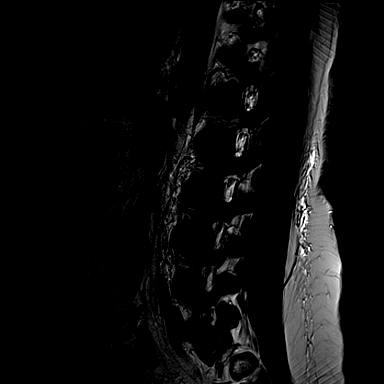
[im 15/15]
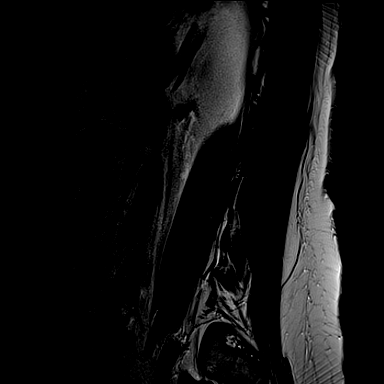

[Series 4: T1 · sagittal · 4.0mm · 0.36mm/px · 3 of 15 slices shown (1 of 2)]
[im 3/15]
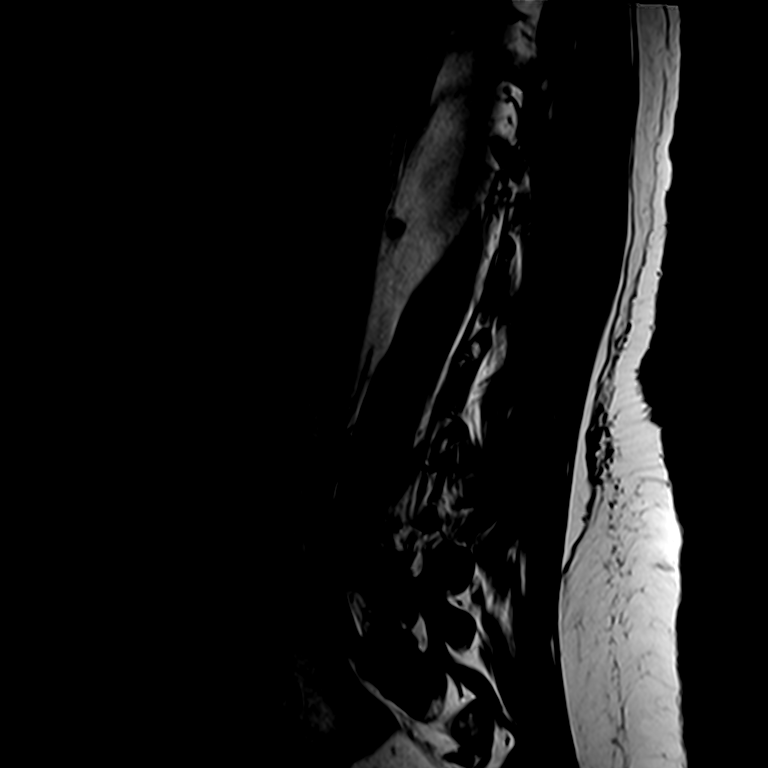
[im 9/15]
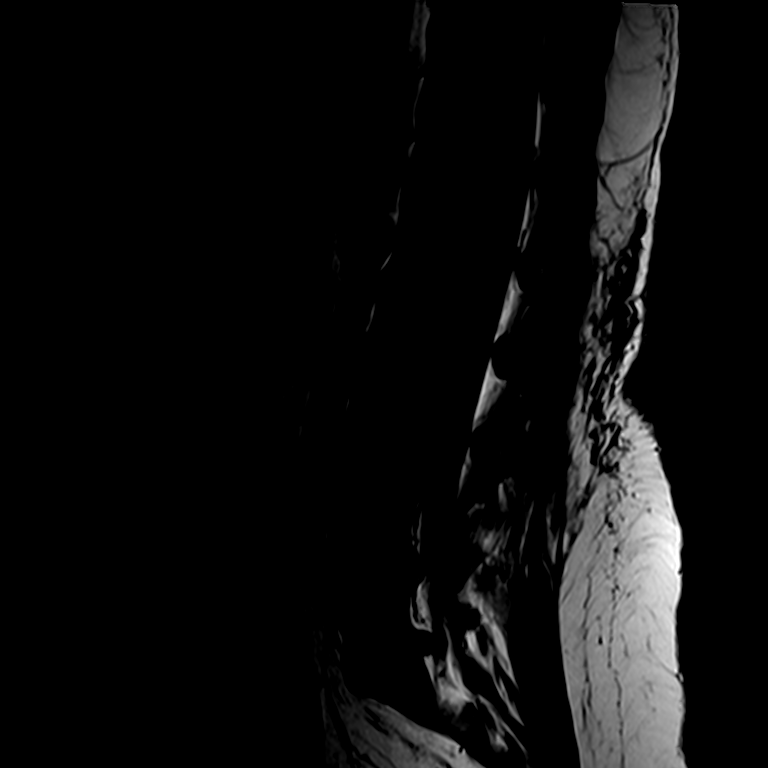
[im 15/15]
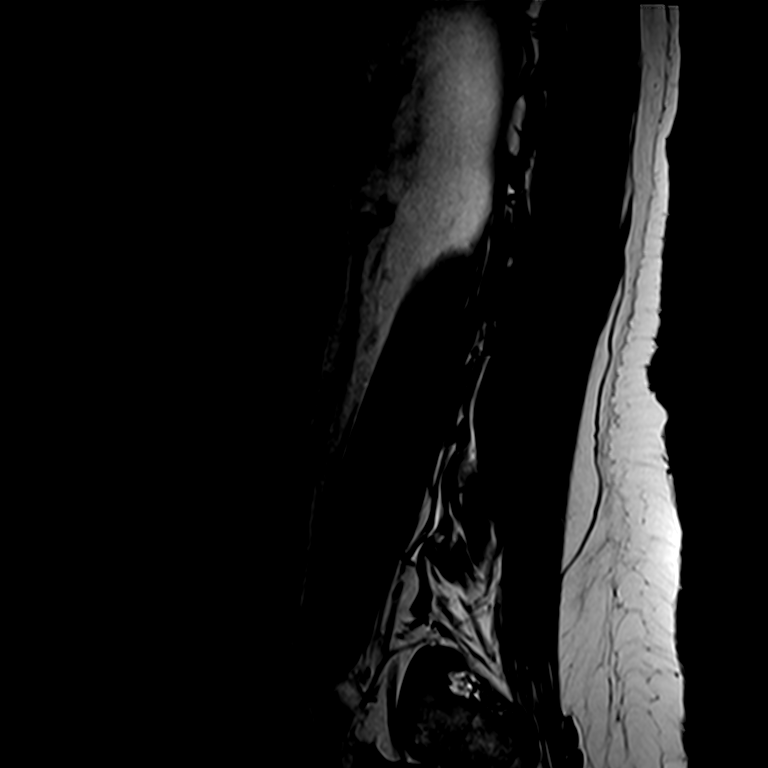

[Series 6: T2 · axial · 4.0mm · 0.24mm/px · z∈[-99,+24]mm · 3 of 37 slices shown (2 of 2)]
[im 6/37]
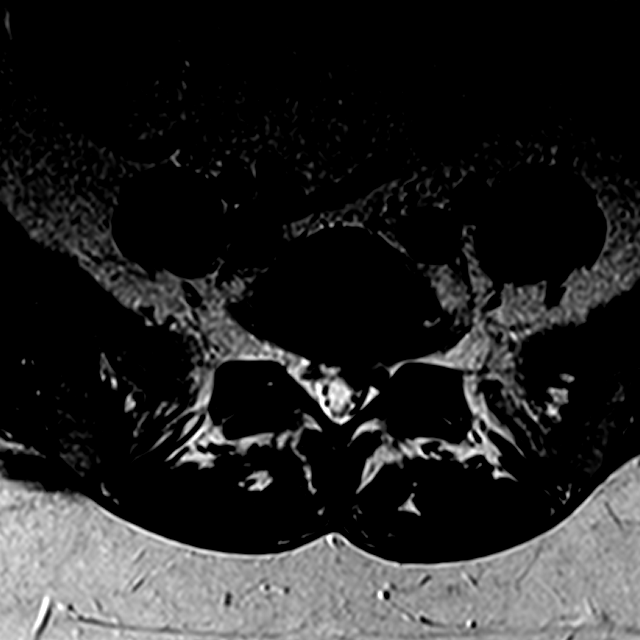
[im 19/37]
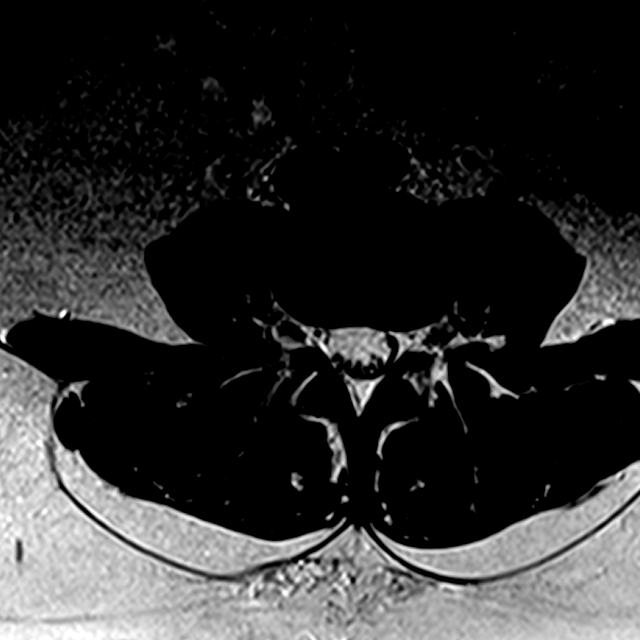
[im 31/37]
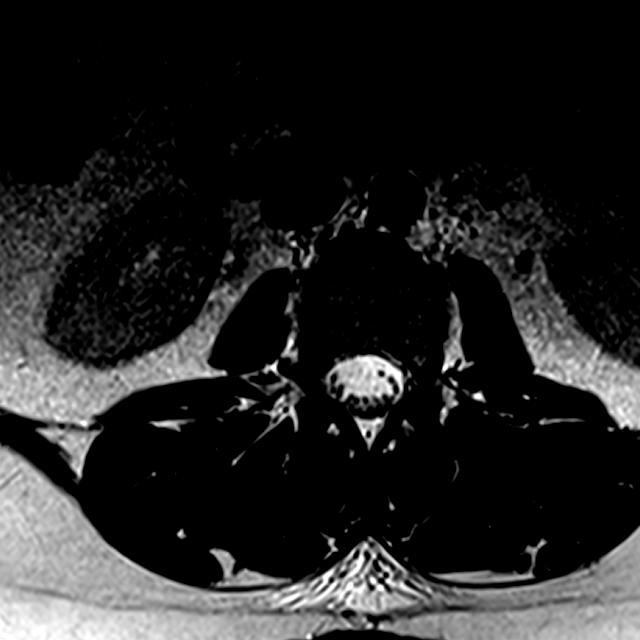

[Series 7: T1 · axial · 4.0mm · 0.24mm/px · z∈[-99,+24]mm · 3 of 37 slices shown (2 of 2)]
[im 6/37]
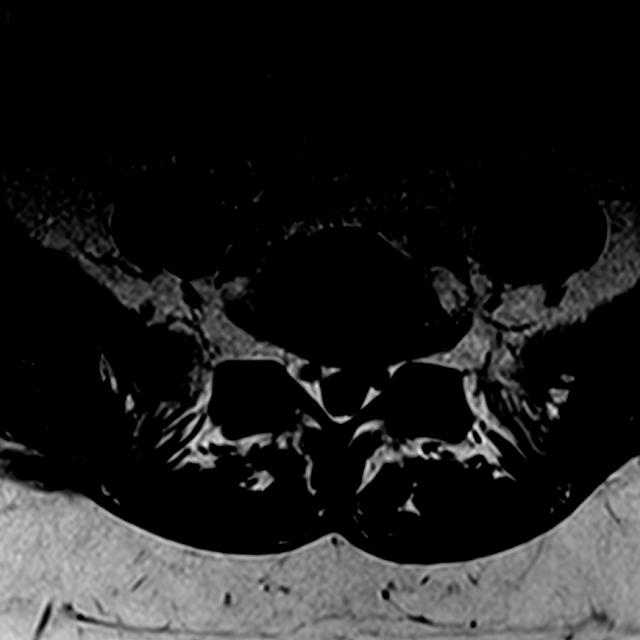
[im 19/37]
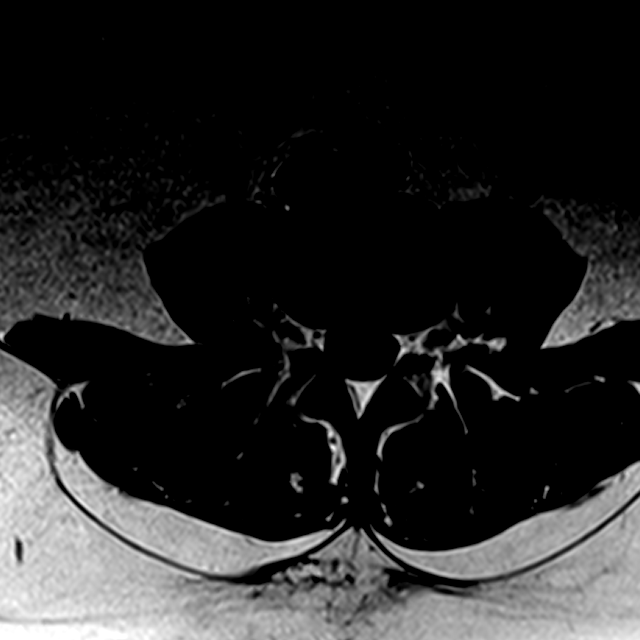
[im 31/37]
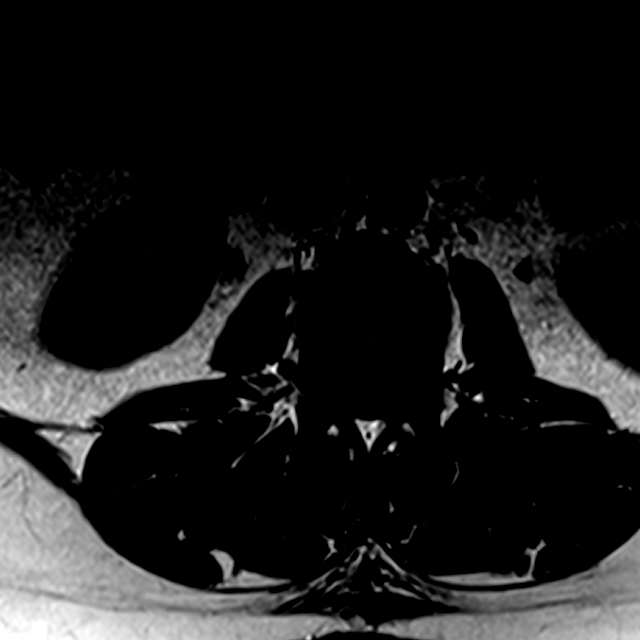

[15 of 48 positions shown; findings below may reference images not displayed]

FINDINGS: Segmentation: Standard. Lowest well-formed disc labeled the L5-S1
level.

Alignment:  Physiologic.

Vertebrae: Vertebral body heights well maintained without evidence
for acute or chronic fracture. Bone marrow signal intensity within
normal limits. Small benign hemangioma noted within the L4 vertebral
body. No other discrete or worrisome osseous lesions. No abnormal
marrow edema.

Conus medullaris and cauda equina: Conus extends to the L1 level.
Conus and cauda equina appear normal.

Paraspinal and other soft tissues: Unremarkable.

Disc levels:

No significant findings are seen through the L3-4 level.

L4-5: Mild diffuse disc bulge. Mild facet hypertrophy. No
significant canal or lateral recess stenosis. Mild bilateral L4
foraminal narrowing. No frank impingement.

L5-S1: Chronic intervertebral disc space narrowing with disc
desiccation. Moderate sized broad left subarticular disc protrusion
extends into the left lateral recess, impinging upon the descending
left S1 nerve root (series 6, image 33). Protruding disc also
closely approximates the descending S1 nerve root as well. Central
canal remains patent. Mild left L5 foraminal narrowing.
IMPRESSION: 1. Moderate sized left subarticular disc protrusion at L5-S1,
impinging upon the descending left S1 nerve root in the left lateral
recess.
2. Mild disc bulging with facet hypertrophy at L4-5 with resultant
mild bilateral L4 foraminal stenosis.

## 2021-01-05 ENCOUNTER — Encounter (INDEPENDENT_AMBULATORY_CARE_PROVIDER_SITE_OTHER): Payer: Self-pay | Admitting: *Deleted

## 2021-01-05 ENCOUNTER — Encounter (INDEPENDENT_AMBULATORY_CARE_PROVIDER_SITE_OTHER): Payer: Self-pay | Admitting: Gastroenterology

## 2021-01-05 ENCOUNTER — Ambulatory Visit (INDEPENDENT_AMBULATORY_CARE_PROVIDER_SITE_OTHER): Payer: Medicaid Other | Admitting: Gastroenterology

## 2021-03-06 ENCOUNTER — Other Ambulatory Visit: Payer: Self-pay

## 2021-03-06 ENCOUNTER — Emergency Department (HOSPITAL_COMMUNITY): Payer: Medicaid Other

## 2021-03-06 ENCOUNTER — Encounter (HOSPITAL_COMMUNITY): Payer: Self-pay | Admitting: *Deleted

## 2021-03-06 ENCOUNTER — Emergency Department (HOSPITAL_COMMUNITY)
Admission: EM | Admit: 2021-03-06 | Discharge: 2021-03-07 | Disposition: A | Discharge status: Home / Self Care | Payer: Medicaid Other | Attending: Emergency Medicine | Admitting: Emergency Medicine

## 2021-03-06 DIAGNOSIS — K8020 Calculus of gallbladder without cholecystitis without obstruction: Secondary | ICD-10-CM | POA: Diagnosis not present

## 2021-03-06 DIAGNOSIS — Z87891 Personal history of nicotine dependence: Secondary | ICD-10-CM | POA: Insufficient documentation

## 2021-03-06 DIAGNOSIS — R112 Nausea with vomiting, unspecified: Secondary | ICD-10-CM | POA: Diagnosis present

## 2021-03-06 LAB — COMPREHENSIVE METABOLIC PANEL
ALT: 25 U/L (ref 0–44)
AST: 26 U/L (ref 15–41)
Albumin: 4.6 g/dL (ref 3.5–5.0)
Alkaline Phosphatase: 86 U/L (ref 38–126)
Anion gap: 9 (ref 5–15)
BUN: 14 mg/dL (ref 6–20)
CO2: 26 mmol/L (ref 22–32)
Calcium: 9.3 mg/dL (ref 8.9–10.3)
Chloride: 101 mmol/L (ref 98–111)
Creatinine, Ser: 0.95 mg/dL (ref 0.44–1.00)
GFR, Estimated: >60 mL/min (ref >60)
Glucose, Bld: 82 mg/dL (ref 70–99)
Potassium: 3.6 mmol/L (ref 3.5–5.1)
Sodium: 136 mmol/L (ref 135–145)
Total Bilirubin: 0.3 mg/dL (ref 0.3–1.2)
Total Protein: 7.9 g/dL (ref 6.5–8.1)

## 2021-03-06 LAB — CBC WITH DIFFERENTIAL/PLATELET
Abs Immature Granulocytes: 0.02 10*3/uL (ref 0.00–0.07)
Basophils Absolute: 0 10*3/uL (ref 0.0–0.1)
Basophils Relative: 0 %
Eosinophils Absolute: 0.1 10*3/uL (ref 0.0–0.5)
Eosinophils Relative: 1 %
HCT: 44.9 % (ref 36.0–46.0)
Hemoglobin: 14.9 g/dL (ref 12.0–15.0)
Immature Granulocytes: 0 %
Lymphocytes Relative: 20 %
Lymphs Abs: 1.5 10*3/uL (ref 0.7–4.0)
MCH: 30.2 pg (ref 26.0–34.0)
MCHC: 33.2 g/dL (ref 30.0–36.0)
MCV: 91.1 fL (ref 80.0–100.0)
Monocytes Absolute: 0.4 10*3/uL (ref 0.1–1.0)
Monocytes Relative: 6 %
Neutro Abs: 5.5 10*3/uL (ref 1.7–7.7)
Neutrophils Relative %: 73 %
Platelets: 256 10*3/uL (ref 150–400)
RBC: 4.93 MIL/uL (ref 3.87–5.11)
RDW: 13.9 % (ref 11.5–15.5)
WBC: 7.5 10*3/uL (ref 4.0–10.5)
nRBC: 0 % (ref 0.0–0.2)

## 2021-03-06 LAB — URINALYSIS, ROUTINE W REFLEX MICROSCOPIC
Bilirubin Urine: NEGATIVE
Glucose, UA: NEGATIVE mg/dL
Hgb urine dipstick: NEGATIVE
Ketones, ur: 20 mg/dL — AB
Leukocytes,Ua: NEGATIVE
Nitrite: NEGATIVE
Protein, ur: NEGATIVE mg/dL
Specific Gravity, Urine: 1.018 (ref 1.005–1.030)
pH: 6 (ref 5.0–8.0)

## 2021-03-06 LAB — I-STAT BETA HCG BLOOD, ED (MC, WL, AP ONLY): I-stat hCG, quantitative: <5 m[IU]/mL (ref <5)

## 2021-03-06 LAB — LIPASE, BLOOD: Lipase: 29 U/L (ref 11–51)

## 2021-03-06 MED ORDER — IOHEXOL 350 MG/ML SOLN
75.0000 mL | Freq: Once | INTRAVENOUS | Status: AC | PRN
  Administered 2021-03-06: 75 mL via INTRAVENOUS

## 2021-03-06 MED ORDER — ONDANSETRON HCL 4 MG/2ML IJ SOLN
4.0000 mg | Freq: Once | INTRAMUSCULAR | Status: AC
  Administered 2021-03-06: 4 mg via INTRAVENOUS
  Filled 2021-03-06: qty 2

## 2021-03-06 NOTE — ED Provider Notes (Addendum)
[facility] Emergency Department Provider Note MRN:  [id number]  Arrival date & time: 03/07/21     Chief Complaint   Nausea   History of Present Illness   Destiny Beck is a 50 y.o. year-old female with a history of neck pain presenting to the ED with chief complaint of nausea.  Location: Nausea Duration: 3 weeks Onset: Gradual Timing: Intermittent Description: Intense bouts of nausea followed by sneezing Severity: Red, getting worse Exacerbating/Alleviating Factors: Improved with CBD pen Associated Symptoms: Recently with some episodes of nonbloody nonbilious emesis Pertinent Negatives: Denies abdominal pain, no fever, no chest pain, no shortness of breath  Additional History: None  Review of Systems  A complete 10 system review of systems was obtained and all systems are negative except as noted in the HPI and PMH.   Patient's Health History    Past Medical History:  Diagnosis Date   Abscess    Anxiety    Chronic back pain    Chronic fatigue    Chronic neck pain    Depression    Dizziness    Headache    Occipital neuralgia    Sciatica     Past Surgical History:  Procedure Laterality Date   BIOPSY  08/04/2018   Procedure: BIOPSY;  Surgeon: Rogene Houston, MD;  Location: [facility];  Service: Endoscopy;;  gastric    CESAREAN SECTION     x2   COLONOSCOPY WITH PROPOFOL N/A 08/04/2018   Procedure: COLONOSCOPY WITH PROPOFOL;  Surgeon: Rogene Houston, MD;  Location: [facility];  Service: Endoscopy;  Laterality: N/A;  11.50   ESOPHAGOGASTRODUODENOSCOPY (EGD) WITH PROPOFOL N/A 08/04/2018   Procedure: ESOPHAGOGASTRODUODENOSCOPY (EGD) WITH PROPOFOL;  Surgeon: Rogene Houston, MD;  Location: [facility];  Service: Endoscopy;  Laterality: N/A;   TUBAL LIGATION      No family history on file.  Social History   Socioeconomic History   Marital status: Divorced    Spouse name: Not on file   Number of children: Not on file   Years  of education: Not on file   Highest education level: Not on file  Occupational History   Not on file  Tobacco Use   Smoking status: Former    Packs/day: 0.50    Years: 30.00    Pack years: 15.00    Types: Cigarettes    Quit date: 01/07/2017    Years since quitting: 4.1   Smokeless tobacco: Never  Vaping Use   Vaping Use: Never used  Substance and Sexual Activity   Alcohol use: Yes    Comment: Occ   Drug use: Yes    Types: Marijuana    Comment: occ   Sexual activity: Yes    Birth control/protection: Surgical  Other Topics Concern   Not on file  Social History Narrative   Not on file   Social Determinants of Health   Financial Resource Strain: Not on file  Food Insecurity: Not on file  Transportation Needs: Not on file  Physical Activity: Not on file  Stress: Not on file  Social Connections: Not on file  Intimate Partner Violence: Not on file     Physical Exam   Vitals:   03/07/21 0100 03/07/21 0101  BP: (!) 128/100   Pulse: 73   Resp: 17   Temp:  98.9 F (37.2 C)  SpO2: 98%     CONSTITUTIONAL: Well-appearing, NAD NEURO:  Alert and oriented x 3, no focal deficits EYES:  eyes  equal and reactive ENT/NECK:  no LAD, no JVD CARDIO: Regular rate, well-perfused, normal S1 and S2 PULM:  CTAB no wheezing or rhonchi GI/GU:  normal bowel sounds, non-distended, non-tender MSK/SPINE:  No gross deformities, no edema SKIN:  no rash, atraumatic PSYCH:  Appropriate speech and behavior  *Additional and/or pertinent findings included in MDM below  Diagnostic and Interventional Summary    EKG Interpretation  Date/Time:  Friday March 06 2021 23:29:47 EDT Ventricular Rate:  67 PR Interval:    QRS Duration: 91 QT Interval:  421 QTC Calculation: 445 R Axis:   74 Text Interpretation: Normal sinus rhythm Confirmed by Gerlene Fee [phone]) on 03/07/2021 1:13:42 AM       Labs Reviewed  URINALYSIS, ROUTINE W REFLEX MICROSCOPIC - Abnormal; Notable for the following  components:      Result Value   Color, Urine AMBER (*)    APPearance CLOUDY (*)    Ketones, ur 20 (*)    All other components within normal limits  CBC WITH DIFFERENTIAL/PLATELET  COMPREHENSIVE METABOLIC PANEL  LIPASE, BLOOD  I-STAT BETA HCG BLOOD, ED ([facility], [facility], [facility] ONLY)    CT ABDOMEN PELVIS W CONTRAST  Final Result      Medications  ondansetron (ZOFRAN) injection 4 mg (4 mg Intravenous Given 03/06/21 2330)  iohexol (OMNIPAQUE) 350 MG/ML injection 75 mL (75 mLs Intravenous Contrast Given 03/06/21 2336)     Procedures  /  Critical Care Procedures  ED Course and Medical Decision Making  I have reviewed the triage vital signs, the nursing notes, and pertinent available records from the EMR.  Listed above are laboratory and imaging tests that I personally ordered, reviewed, and interpreted and then considered in my medical decision making (see below for details).  3+ weeks of waves of nausea, seems to be getting worse.  Abdomen soft and nontender, no rebound guarding or rigidity, no chest pain or shortness of breath.  Work-up here is reassuring thus far with normal labs, kidney function, liver function, no signs of pancreatitis.  We underwent some shared decision-making, explained that CT imaging is an option but will likely be normal.  Patient very worried about her symptoms, will proceed with CT imaging.  Anticipating outpatient GI follow-up and more symptomatic management at home.     CT is without acute process, there is evidence of cholelithiasis.  A bit atypical for biliary colic not having any right upper quadrant pain but still possible.  Referred to general surgery, appropriate for discharge.  Barth Kirks. Sedonia Small, MD [place] [email or web address]  Final Clinical Impressions(s) / ED Diagnoses     ICD-10-CM   1. Calculus of gallbladder without cholecystitis without obstruction  K80.20       ED Discharge Orders          Ordered     ondansetron (ZOFRAN ODT) 4 MG disintegrating tablet  Every 8 hours PRN        03/07/21 0040             Discharge Instructions Discussed with and Provided to Patient:     Discharge Instructions      You were evaluated in the Emergency Department and after careful evaluation, we did not find any emergent condition requiring admission or further testing in the hospital.  Your exam/testing today is overall reassuring.  Your symptoms may be due to stones in the gallbladder.  Recommend follow-up with the general surgeons for evaluation.  Please return to the Emergency Department  if you experience any worsening of your condition.   Thank you for allowing [place] to be a part of your care.        Maudie Flakes, MD 03/07/21 0041    Maudie Flakes, MD 03/07/21 [id number]

## 2021-03-06 NOTE — ED Notes (Signed)
Pt reports emesis in room and asked me to look b/c she thought she vomited blood, pt did have some red colored product, did not appear to be blood

## 2021-03-06 NOTE — ED Triage Notes (Signed)
Pt in c/o nausea onset x3 weeks intermittently with worsening, pt reports hx of GERD, pt states, " I get nauseous and then I sneeze and it goes away." pt  reports vomiting today x3 pt states, "I use CBD inhaler pen to help with the nausea and they aren't cheap." Denies diarrhea, reports occassional marijuana use, last use tonight

## 2021-03-06 NOTE — ED Notes (Signed)
Pt here for assistance in determining cause of nausea x 3 weeks. Pt reports use of thc historically and has switched to cbd pens with less thc in them but are costly and she is using more to treat her nausea. Pt is also on a new weight loss medication she started at around the same time as symptoms of nausea started.

## 2021-03-07 MED ORDER — ONDANSETRON 4 MG PO TBDP: 4.0000 mg | ORAL_TABLET | Freq: Three times a day (TID) | ORAL | 0 refills | Status: DC | PRN

## 2021-03-07 NOTE — Discharge Instructions (Addendum)
You were evaluated in the Emergency Department and after careful evaluation, we did not find any emergent condition requiring admission or further testing in the hospital.  Your exam/testing today is overall reassuring.  Your symptoms may be due to stones in the gallbladder.  Recommend follow-up with the general surgeons for evaluation.  Please return to the Emergency Department if you experience any worsening of your condition.   Thank you for allowing [place] to be a part of your care.

## 2021-03-17 ENCOUNTER — Encounter: Payer: Self-pay | Admitting: General Surgery

## 2021-03-17 ENCOUNTER — Other Ambulatory Visit: Payer: Self-pay

## 2021-03-17 ENCOUNTER — Ambulatory Visit (INDEPENDENT_AMBULATORY_CARE_PROVIDER_SITE_OTHER): Payer: Medicaid Other | Admitting: General Surgery

## 2021-03-17 VITALS — BP 111/76 | HR 90 | Temp 98.3°F | Resp 14 | Ht 60.0 in | Wt 164.0 lb

## 2021-03-17 DIAGNOSIS — K8020 Calculus of gallbladder without cholecystitis without obstruction: Secondary | ICD-10-CM | POA: Diagnosis not present

## 2021-03-17 NOTE — Patient Instructions (Signed)
Cholelithiasis Cholelithiasis happens when gallstones form in the gallbladder. The gallbladder stores bile. Bile is a fluid that helps digest fats. Bile can harden and form into gallstones. If they cause a blockage, they can cause pain (gallbladder attack). What are the causes? This condition may be caused by: Some blood diseases, such as sickle cell anemia. Too much of a fat-like substance (cholesterol) in your bile. Not enough bile salts in your bile. These salts help the body absorb and digest fats. The gallbladder not emptying fully or often enough. This is common in pregnant women. What increases the risk? The following factors may make you more likely to develop this condition: Being female. Being pregnant many times. Eating a lot of fried foods, fat, and refined carbs (refined carbohydrates). Being very overweight (obese). Being older than age 9. Using medicines with female hormones in them for a long time. Losing weight fast. Having gallstones in your family. Having some health problems, such as diabetes, Crohn's disease, or liver disease. What are the signs or symptoms? Often, there may be gallstones but no symptoms. These gallstones are called silent gallstones. If a gallstone causes a blockage, you may get sudden pain. The pain: Can be in the upper right part of your belly (abdomen). Normally comes at night or after you eat. Can last an hour or more. Can spread to your right shoulder, back, or chest. Can feel like discomfort, burning, or fullness in the upper part of your belly (indigestion). If the blockage lasts more than a few hours, you can get an infection or swelling. You may: Feel like you may vomit. Vomit. Feel bloated. Have belly pain for 5 hours or more. Feel tender in your belly, often in the upper right part and under your ribs. Have fever or chills. Have skin or the white parts of your eyes turn yellow (jaundice). Have dark pee (urine) or pale poop  (stool). How is this treated? Treatment for this condition depends on how bad you feel. If you have symptoms, you may need: Home care, if symptoms are not very bad. Do not eat for 12-24 hours. Drink only water and clear liquids. Start to eat simple or clear foods after 1 or 2 days. Try broths and crackers. You may need medicines for pain or stomach upset or both. If you have an infection, you will need antibiotics. A hospital stay, if you have very bad pain or a very bad infection. Surgery to remove your gallbladder. You may need this if: Gallstones keep coming back. You have very bad symptoms. Medicines to break up gallstones. Medicines: Are best for small gallstones. May be used for up to 6-12 months. A procedure to find and take out gallstones or to break up gallstones. Follow these instructions at home: Medicines Take over-the-counter and prescription medicines only as told by your doctor. If you were prescribed an antibiotic medicine, take it as told by your doctor. Do not stop taking the antibiotic even if you start to feel better. Ask your doctor if the medicine prescribed to you requires you to avoid driving or using machinery. Eating and drinking Drink enough fluid to keep your urine pale yellow. Drink water or clear fluids. This is important when you have pain. Eat healthy foods. Choose: Fewer fatty foods, such as fried foods. Fewer refined carbs. Avoid breads and grains that are highly processed, such as white bread and white rice. Choose whole grains, such as whole-wheat bread and brown rice. More fiber. Almonds, fresh fruit, and beans  are healthy sources. General instructions Keep a healthy weight. Keep all follow-up visits as told by your doctor. This is important. Where to find more information [employer] of Diabetes and Digestive and Kidney Diseases: [email or web address] Contact a doctor if: You have sudden pain in the upper right part of your belly. Pain might  spread to your right shoulder, back, or chest. You have been diagnosed with gallstones that have no symptoms and you get: Belly pain. Discomfort, burning, or fullness in the upper part of your abdomen. You have dark urine or pale stools. Get help right away if: You have sudden pain in the upper right part of your abdomen, and the pain lasts more than 2 hours. You have pain in your abdomen, and: It lasts more than 5 hours. It keeps getting worse. You have a fever or chills. You keep feeling like you may vomit. You keep vomiting. Your skin or the white parts of your eyes turn yellow. Summary Cholelithiasis happens when gallstones form in the gallbladder. This condition may be caused by a blood disease, too much of a fat-like substance in the bile, or not enough bile salts in bile. Treatment for this condition depends on how bad you feel. If you have symptoms, do not eat or drink. You may need medicines. You may need a hospital stay for very bad pain or a very bad infection. You may need surgery if gallstones keep coming back or if you have very bad symptoms. This information is not intended to replace advice given to you by your health care provider. Make sure you discuss any questions you have with your health care provider. Document Revised: 08/17/2019 Document Reviewed: 05/21/2019 Elsevier Patient Education  2022 [place].   Minimally Invasive Cholecystectomy Minimally invasive cholecystectomy is surgery to remove the gallbladder. The gallbladder is a pear-shaped organ that lies beneath the liver on the right side of the body. The gallbladder stores bile, which is a fluid that helps the body digest fats. Cholecystectomy is often done to treat inflammation of the gallbladder (cholecystitis). This condition is usually caused by a buildup of gallstones (cholelithiasis) in the gallbladder. Gallstones can block the flow of bile, which can result in inflammation and pain. In severe cases,  emergency surgery may be required. This procedure is done though small incisions in the abdomen, instead of one large incision. It is also called laparoscopic surgery. A thin scope with a camera (laparoscope) is inserted through one incision. Then surgical instruments are inserted through the other incisions. In some cases, a minimally invasive surgery may need to be changed to a surgery that is done through a larger incision. This is called open surgery. Tell a health care provider about: Any allergies you have. All medicines you are taking, including vitamins, herbs, eye drops, creams, and over-the-counter medicines. Any problems you or family members have had with anesthetic medicines. Any blood disorders you have. Any surgeries you have had. Any medical conditions you have. Whether you are pregnant or may be pregnant. What are the risks? Generally, this is a safe procedure. However, problems may occur, including: Infection. Bleeding. Allergic reactions to medicines. Damage to nearby structures or organs. A stone remaining in the common bile duct. The common bile duct carries bile from the gallbladder into the small intestine. A bile leak from the cyst duct that is clipped when your gallbladder is removed. What happens before the procedure? Medicines Ask your health care provider about: Changing or stopping your regular medicines. This is especially  important if you are taking diabetes medicines or blood thinners. Taking medicines such as aspirin and ibuprofen. These medicines can thin your blood. Do not take these medicines unless your health care provider tells you to take them. Taking over-the-counter medicines, vitamins, herbs, and supplements. General instructions Let your health care provider know if you develop a cold or an infection before surgery. Plan to have someone take you home from the hospital or clinic. If you will be going home right after the procedure, plan to have  someone with you for 24 hours. Ask your health care provider: How your surgery site will be marked. What steps will be taken to help prevent infection. These may include: Removing hair at the surgery site. Washing skin with a germ-killing soap. Taking antibiotic medicine. What happens during the procedure?  An IV will be inserted into one of your veins. You will be given one or both of the following: A medicine to help you relax (sedative). A medicine to make you fall asleep (general anesthetic). A breathing tube will be placed in your mouth. Your surgeon will make several small incisions in your abdomen. The laparoscope will be inserted through one of the small incisions. The camera on the laparoscope will send images to a monitor in the operating room. This lets your surgeon see inside your abdomen. A gas will be pumped into your abdomen. This will expand your abdomen to give the surgeon more room to perform the surgery. Other tools that are needed for the procedure will be inserted through the other incisions. The gallbladder will be removed through one of the incisions. Your common bile duct may be examined. If stones are found in the common bile duct, they may be removed. After your gallbladder has been removed, the incisions will be closed with stitches (sutures), staples, or skin glue. Your incisions may be covered with a bandage (dressing). The procedure may vary among health care providers and hospitals. What happens after the procedure? Your blood pressure, heart rate, breathing rate, and blood oxygen level will be monitored until you leave the hospital or clinic. You will be given medicines as needed to control your pain. If you were given a sedative during the procedure, it can affect you for several hours. Do not drive or operate machinery until your health care provider says that it is safe. Summary Minimally invasive cholecystectomy, also called laparoscopic cholecystectomy,  is surgery to remove the gallbladder using small incisions. Tell your health care provider about all the medical conditions you have and all the medicines you are taking for those conditions. Before the procedure, follow instructions about eating or drinking restrictions and changing or stopping medicines. If you were given a sedative during the procedure, it can affect you for several hours. Do not drive or operate machinery until your health care provider says that it is safe. This information is not intended to replace advice given to you by your health care provider. Make sure you discuss any questions you have with your health care provider. Document Revised: 04/02/2019 Document Reviewed: 04/02/2019 Elsevier Patient Education  [place].

## 2021-03-18 ENCOUNTER — Encounter (HOSPITAL_COMMUNITY)
Admission: RE | Admit: 2021-03-18 | Discharge: 2021-03-18 | Disposition: A | Discharge status: Home / Self Care | Payer: Medicaid Other | Source: Ambulatory Visit | Attending: General Surgery | Admitting: General Surgery

## 2021-03-18 DIAGNOSIS — K8020 Calculus of gallbladder without cholecystitis without obstruction: Secondary | ICD-10-CM

## 2021-03-18 HISTORY — DX: Fibromyalgia: M79.7

## 2021-03-18 HISTORY — DX: Essential (primary) hypertension: I10

## 2021-03-18 HISTORY — DX: Gastro-esophageal reflux disease without esophagitis: K21.9

## 2021-03-18 HISTORY — DX: Type 2 diabetes mellitus without complications: E11.9

## 2021-03-18 HISTORY — DX: Unspecified osteoarthritis, unspecified site: M19.90

## 2021-03-18 HISTORY — DX: Anemia, unspecified: D64.9

## 2021-03-18 NOTE — H&P (Signed)
[facility] History and Physical  Reason for Referral: Gallstones  Referring Physician:  Bridget Hartshorn, NP    Chief Complaint   New Patient (Initial Visit)     Destiny Beck is a 50 y.o. female.  HPI: Destiny Beck is a 50 yo with chronic back pain, chronic GERD on dexilant, headaches who has been having chronic nausea and bloating for over 1 month with fatty meals. Destiny Beck was seen in the Ed and found to have gallstones. Destiny Beck normally has looser stools and now with the zofran intake is more constipated. Destiny Beck does not have excessive NSAIDs, and says that her only pain is really in her back but Destiny Beck has chronic back pain so Destiny Beck cannot tell if Destiny Beck has any additional pain with the episodes.  Destiny Beck was referred to discuss cholecystectomy. Destiny Beck has been seen by Allen Norris NP for GI in the past for her GERD but has never had a EGD Destiny Beck reports.   Past Medical History:  Diagnosis Date   Abscess    Anxiety    Chronic back pain    Chronic fatigue    Chronic neck pain    Depression    Dizziness    Headache    Occipital neuralgia    Sciatica     Past Surgical History:  Procedure Laterality Date   BIOPSY  08/04/2018   Procedure: BIOPSY;  Surgeon: Rogene Houston, MD;  Location: [facility];  Service: Endoscopy;;  gastric    CESAREAN SECTION     x2   COLONOSCOPY WITH PROPOFOL N/A 08/04/2018   Procedure: COLONOSCOPY WITH PROPOFOL;  Surgeon: Rogene Houston, MD;  Location: [facility];  Service: Endoscopy;  Laterality: N/A;  11.50   ESOPHAGOGASTRODUODENOSCOPY (EGD) WITH PROPOFOL N/A 08/04/2018   Procedure: ESOPHAGOGASTRODUODENOSCOPY (EGD) WITH PROPOFOL;  Surgeon: Rogene Houston, MD;  Location: [facility];  Service: Endoscopy;  Laterality: N/A;   TUBAL LIGATION      History reviewed. No pertinent family history.  Social History   Tobacco Use   Smoking status: Former    Packs/day: 0.50    Years: 30.00    Pack years: 15.00    Types: Cigarettes    Quit  date: 01/07/2017    Years since quitting: 4.1   Smokeless tobacco: Never  Vaping Use   Vaping Use: Never used  Substance Use Topics   Alcohol use: Yes    Comment: Occ   Drug use: Yes    Types: Marijuana    Comment: occ    Medications: I have reviewed the patient's current medications. Allergies as of 03/17/2021       Reactions   Cymbalta [duloxetine Hcl] Other (See Comments)   Stroke like symptoms   Tylenol [acetaminophen] Other (See Comments)   Due to complications with Liver   Ibuprofen Other (See Comments)   Due to anemia        Medication List        Accurate as of March 17, 2021 11:59 PM. If you have any questions, ask your nurse or doctor.          STOP taking these medications    ARIPiprazole 5 MG tablet Commonly known as: ABILIFY Stopped by: Virl Cagey, MD   cyclobenzaprine 10 MG tablet Commonly known as: FLEXERIL Stopped by: Virl Cagey, MD   LORazepam 0.5 MG tablet Commonly known as: Ativan Stopped by: Virl Cagey, MD   nortriptyline 10 MG capsule Commonly known as: PAMELOR  Stopped by: Virl Cagey, MD       TAKE these medications    albuterol 108 (90 Base) MCG/ACT inhaler Commonly known as: VENTOLIN HFA Inhale 1-2 puffs into the lungs every 6 (six) hours as needed for wheezing or shortness of breath.   buPROPion 300 MG 24 hr tablet Commonly known as: WELLBUTRIN XL Take 300 mg by mouth daily. What changed: Another medication with the same name was removed. Continue taking this medication, and follow the directions you see here. Changed by: Virl Cagey, MD   CombiPatch 0.05-0.14 MG/DAY Generic drug: estradiol-norethindrone Place 1 patch onto the skin 2 (two) times a week.   cyanocobalamin 1000 MCG tablet Take 1,000 mcg by mouth daily.   Dexilant 60 MG capsule Generic drug: dexlansoprazole Take 1 capsule by mouth daily.   estradiol 1 MG tablet Commonly known as: ESTRACE Take 1 mg by mouth in the  morning.   ferrous sulfate 325 (65 FE) MG EC tablet Take 325 mg by mouth daily with breakfast.   gabapentin 600 MG tablet Commonly known as: NEURONTIN Take 600 mg by mouth 3 (three) times daily.   hydrOXYzine 25 MG tablet Commonly known as: ATARAX/VISTARIL Take 25 mg by mouth 3 (three) times daily as needed for anxiety or itching.   meclizine 25 MG tablet Commonly known as: ANTIVERT Take 1-2 tablets (25-50 mg total) by mouth 3 (three) times daily as needed for dizziness.   metFORMIN 500 MG tablet Commonly known as: GLUCOPHAGE Take 500 mg by mouth 2 (two) times daily with a meal.   ondansetron 4 MG disintegrating tablet Commonly known as: Zofran ODT Take 1 tablet (4 mg total) by mouth every 8 (eight) hours as needed for nausea or vomiting.   prazosin 2 MG capsule Commonly known as: MINIPRESS Take 2 mg by mouth at bedtime.   rizatriptan 10 MG tablet Commonly known as: MAXALT Take 10 mg by mouth as needed for migraine.   venlafaxine XR 75 MG 24 hr capsule Commonly known as: EFFEXOR-XR Take 225 mg by mouth in the morning.         ROS:  A comprehensive review of systems was negative except for: Gastrointestinal: positive for nausea, reflux symptoms, and vomiting Genitourinary: positive for frequency Musculoskeletal: positive for back pain, neck pain, and joint pain Neurological: positive for headaches  Blood pressure 111/76, pulse 90, temperature 98.3 F (36.8 C), temperature source Other (Comment), resp. rate 14, height 5' (1.524 m), weight 164 lb (74.4 kg), SpO2 96 %. Physical Exam Vitals reviewed.  Constitutional:      Appearance: Normal appearance.  HENT:     Head: Normocephalic.     Nose: Nose normal.     Mouth/Throat:     Mouth: Mucous membranes are moist.  Eyes:     Extraocular Movements: Extraocular movements intact.  Cardiovascular:     Rate and Rhythm: Normal rate and regular rhythm.  Pulmonary:     Effort: Pulmonary effort is normal.     Breath  sounds: Normal breath sounds.  Abdominal:     General: There is no distension.     Palpations: Abdomen is soft.     Tenderness: There is abdominal tenderness in the right upper quadrant and epigastric area.  Musculoskeletal:        General: Normal range of motion.     Cervical back: Normal range of motion.  Skin:    General: Skin is warm.  Neurological:     General: No focal deficit present.  Mental Status: Destiny Beck is alert and oriented to person, place, and time.  Psychiatric:        Mood and Affect: Mood normal.        Behavior: Behavior normal.        Thought Content: Thought content normal.        Judgment: Judgment normal.    Results:personally reviewed CT- gallstones on imaging, no obvious fluid or bile duct dilation  CLINICAL DATA:  Nausea for 3 weeks, vomiting   EXAM: CT ABDOMEN AND PELVIS WITH CONTRAST   TECHNIQUE: Multidetector CT imaging of the abdomen and pelvis was performed using the standard protocol following bolus administration of intravenous contrast.   CONTRAST:  15m OMNIPAQUE IOHEXOL 350 MG/ML SOLN   COMPARISON:  02/11/2017   FINDINGS: Lower chest: No acute pleural or parenchymal lung disease.   Hepatobiliary: Diffuse hepatic steatosis. No focal liver abnormality. Calcified gallstones without cholecystitis. No biliary dilation.   Pancreas: Unremarkable. No pancreatic ductal dilatation or surrounding inflammatory changes.   Spleen: Normal in size without focal abnormality.   Adrenals/Urinary Tract: 7 mm nonobstructing calculus lower pole left kidney. 3 mm nonobstructing calculus lower pole right kidney. No obstructive uropathy within either kidney. The adrenals and bladder are unremarkable.   Stomach/Bowel: No bowel obstruction or ileus. Normal appendix right lower quadrant. No bowel wall thickening or inflammatory change.   Vascular/Lymphatic: No significant vascular findings are present. No enlarged abdominal or pelvic lymph nodes.    Reproductive: Postsurgical scarring seen within the ventral aspect of the uterus from prior cesarean section. No adnexal mass.   Other: No free fluid or free gas.  No abdominal wall hernia.   Musculoskeletal: No acute or destructive bony lesions. Reconstructed images demonstrate no additional findings.   IMPRESSION: 1. Cholelithiasis without cholecystitis. 2. Bilateral nonobstructing renal calculi. 3. Hepatic steatosis. 4. Normal appendix.     Electronically Signed   By: MRanda NgoM.D.   On: 03/06/2021 23:53  Assessment & Plan:  DLARUA PEARCYis a 50y.o. female with gallstones and reported nausea/bloating and back pain. Destiny Beck is tender in the epigastric and RUQ region. Destiny Beck says that this is worse with fatty foods. Destiny Beck is ready to proceed with cholecystectomy. Destiny Beck understands that multiple things can cause nausea and that an EGD is also a possible future need if the cholecystectomy does not resolve her issues.   PLAN: I counseled the patient about the indication, risks and benefits of laparoscopic cholecystectomy.  Destiny Beck understands there is a very small chance for bleeding, infection, injury to normal structures (including common bile duct), conversion to open surgery, persistent symptoms, evolution of postcholecystectomy diarrhea, need for secondary interventions, anesthesia reaction, cardiopulmonary issues and other risks not specifically detailed here. I described the expected recovery, the plan for follow-up and the restrictions during the recovery phase.  All questions were answered.   All questions were answered to the satisfaction of the patient.   LVirl Cagey9/01/2021, 10:26 AM

## 2021-03-18 NOTE — Pre-Procedure Instructions (Signed)
Attempted to do Pre-op call but there was no answer.

## 2021-03-18 NOTE — Progress Notes (Signed)
[facility] History and Physical  Reason for Referral: Gallstones  Referring Physician:  Bridget Hartshorn, NP    Chief Complaint   New Patient (Initial Visit)     Destiny Beck is a 50 y.o. female.  HPI: Ms. Bacigalupi is a 50 yo with chronic back pain, chronic GERD on dexilant, headaches who has been having chronic nausea and bloating for over 1 month with fatty meals. She was seen in the Ed and found to have gallstones. She normally has looser stools and now with the zofran intake is more constipated. She does not have excessive NSAIDs, and says that her only pain is really in her back but she has chronic back pain so she cannot tell if she has any additional pain with the episodes.  She was referred to discuss cholecystectomy. She has been seen by Allen Norris NP for GI in the past for her GERD but has never had a EGD she reports.   Past Medical History:  Diagnosis Date   Abscess    Anxiety    Chronic back pain    Chronic fatigue    Chronic neck pain    Depression    Dizziness    Headache    Occipital neuralgia    Sciatica     Past Surgical History:  Procedure Laterality Date   BIOPSY  08/04/2018   Procedure: BIOPSY;  Surgeon: Rogene Houston, MD;  [facility]: [facility];  Service: Endoscopy;;  gastric    CESAREAN SECTION     x2   COLONOSCOPY WITH PROPOFOL N/A 08/04/2018   Procedure: COLONOSCOPY WITH PROPOFOL;  Surgeon: Rogene Houston, MD;  [facility]: [facility];  Service: Endoscopy;  Laterality: N/A;  11.50   ESOPHAGOGASTRODUODENOSCOPY (EGD) WITH PROPOFOL N/A 08/04/2018   Procedure: ESOPHAGOGASTRODUODENOSCOPY (EGD) WITH PROPOFOL;  Surgeon: Rogene Houston, MD;  [facility]: [facility];  Service: Endoscopy;  Laterality: N/A;   TUBAL LIGATION      History reviewed. No pertinent family history.  Social History   Tobacco Use   Smoking status: Former    Packs/day: 0.50    Years: 30.00    Pack years: 15.00    Types: Cigarettes    Quit  date: 01/07/2017    Years since quitting: 4.1   Smokeless tobacco: Never  Vaping Use   Vaping Use: Never used  Substance Use Topics   Alcohol use: Yes    Comment: Occ   Drug use: Yes    Types: Marijuana    Comment: occ    Medications: I have reviewed the patient's current medications. Allergies as of 03/17/2021       Reactions   Cymbalta [duloxetine Hcl] Other (See Comments)   Stroke like symptoms   Tylenol [acetaminophen] Other (See Comments)   Due to complications with Liver   Ibuprofen Other (See Comments)   Due to anemia        Medication List        Accurate as of March 17, 2021 11:59 PM. If you have any questions, ask your nurse or doctor.          STOP taking these medications    ARIPiprazole 5 MG tablet Commonly known as: ABILIFY Stopped by: Virl Cagey, MD   cyclobenzaprine 10 MG tablet Commonly known as: FLEXERIL Stopped by: Virl Cagey, MD   LORazepam 0.5 MG tablet Commonly known as: Ativan Stopped by: Virl Cagey, MD   nortriptyline 10 MG capsule Commonly known as: PAMELOR  Stopped by: Virl Cagey, MD       TAKE these medications    albuterol 108 (90 Base) MCG/ACT inhaler Commonly known as: VENTOLIN HFA Inhale 1-2 puffs into the lungs every 6 (six) hours as needed for wheezing or shortness of breath.   buPROPion 300 MG 24 hr tablet Commonly known as: WELLBUTRIN XL Take 300 mg by mouth daily. What changed: Another medication with the same name was removed. Continue taking this medication, and follow the directions you see here. Changed by: Virl Cagey, MD   CombiPatch 0.05-0.14 MG/DAY Generic drug: estradiol-norethindrone Place 1 patch onto the skin 2 (two) times a week.   cyanocobalamin 1000 MCG tablet Take 1,000 mcg by mouth daily.   Dexilant 60 MG capsule Generic drug: dexlansoprazole Take 1 capsule by mouth daily.   estradiol 1 MG tablet Commonly known as: ESTRACE Take 1 mg by mouth in the  morning.   ferrous sulfate 325 (65 FE) MG EC tablet Take 325 mg by mouth daily with breakfast.   gabapentin 600 MG tablet Commonly known as: NEURONTIN Take 600 mg by mouth 3 (three) times daily.   hydrOXYzine 25 MG tablet Commonly known as: ATARAX/VISTARIL Take 25 mg by mouth 3 (three) times daily as needed for anxiety or itching.   meclizine 25 MG tablet Commonly known as: ANTIVERT Take 1-2 tablets (25-50 mg total) by mouth 3 (three) times daily as needed for dizziness.   metFORMIN 500 MG tablet Commonly known as: GLUCOPHAGE Take 500 mg by mouth 2 (two) times daily with a meal.   ondansetron 4 MG disintegrating tablet Commonly known as: Zofran ODT Take 1 tablet (4 mg total) by mouth every 8 (eight) hours as needed for nausea or vomiting.   prazosin 2 MG capsule Commonly known as: MINIPRESS Take 2 mg by mouth at bedtime.   rizatriptan 10 MG tablet Commonly known as: MAXALT Take 10 mg by mouth as needed for migraine.   venlafaxine XR 75 MG 24 hr capsule Commonly known as: EFFEXOR-XR Take 225 mg by mouth in the morning.         ROS:  A comprehensive review of systems was negative except for: Gastrointestinal: positive for nausea, reflux symptoms, and vomiting Genitourinary: positive for frequency Musculoskeletal: positive for back pain, neck pain, and joint pain Neurological: positive for headaches  Blood pressure 111/76, pulse 90, temperature 98.3 F (36.8 C), temperature source Other (Comment), resp. rate 14, height 5' (1.524 m), weight 164 lb (74.4 kg), SpO2 96 %. Physical Exam Vitals reviewed.  Constitutional:      Appearance: Normal appearance.  HENT:     Head: Normocephalic.     Nose: Nose normal.     Mouth/Throat:     Mouth: Mucous membranes are moist.  Eyes:     Extraocular Movements: Extraocular movements intact.  Cardiovascular:     Rate and Rhythm: Normal rate and regular rhythm.  Pulmonary:     Effort: Pulmonary effort is normal.     Breath  sounds: Normal breath sounds.  Abdominal:     General: There is no distension.     Palpations: Abdomen is soft.     Tenderness: There is abdominal tenderness in the right upper quadrant and epigastric area.  Musculoskeletal:        General: Normal range of motion.     Cervical back: Normal range of motion.  Skin:    General: Skin is warm.  Neurological:     General: No focal deficit present.  Mental Status: She is alert and oriented to person, place, and time.  Psychiatric:        Mood and Affect: Mood normal.        Behavior: Behavior normal.        Thought Content: Thought content normal.        Judgment: Judgment normal.    Results:personally reviewed CT- gallstones on imaging, no obvious fluid or bile duct dilation  CLINICAL DATA:  Nausea for 3 weeks, vomiting   EXAM: CT ABDOMEN AND PELVIS WITH CONTRAST   TECHNIQUE: Multidetector CT imaging of the abdomen and pelvis was performed using the standard protocol following bolus administration of intravenous contrast.   CONTRAST:  7m OMNIPAQUE IOHEXOL 350 MG/ML SOLN   COMPARISON:  02/11/2017   FINDINGS: Lower chest: No acute pleural or parenchymal lung disease.   Hepatobiliary: Diffuse hepatic steatosis. No focal liver abnormality. Calcified gallstones without cholecystitis. No biliary dilation.   Pancreas: Unremarkable. No pancreatic ductal dilatation or surrounding inflammatory changes.   Spleen: Normal in size without focal abnormality.   Adrenals/Urinary Tract: 7 mm nonobstructing calculus lower pole left kidney. 3 mm nonobstructing calculus lower pole right kidney. No obstructive uropathy within either kidney. The adrenals and bladder are unremarkable.   Stomach/Bowel: No bowel obstruction or ileus. Normal appendix right lower quadrant. No bowel wall thickening or inflammatory change.   Vascular/Lymphatic: No significant vascular findings are present. No enlarged abdominal or pelvic lymph nodes.    Reproductive: Postsurgical scarring seen within the ventral aspect of the uterus from prior cesarean section. No adnexal mass.   Other: No free fluid or free gas.  No abdominal wall hernia.   Musculoskeletal: No acute or destructive bony lesions. Reconstructed images demonstrate no additional findings.   IMPRESSION: 1. Cholelithiasis without cholecystitis. 2. Bilateral nonobstructing renal calculi. 3. Hepatic steatosis. 4. Normal appendix.     Electronically Signed   By: MRanda NgoM.D.   On: 03/06/2021 23:53  Assessment & Plan:  DALICYN SUONis a 50y.o. female with gallstones and reported nausea/bloating and back pain. She is tender in the epigastric and RUQ region. She says that this is worse with fatty foods. She is ready to proceed with cholecystectomy. She understands that multiple things can cause nausea and that an EGD is also a possible future need if the cholecystectomy does not resolve her issues.   PLAN: I counseled the patient about the indication, risks and benefits of laparoscopic cholecystectomy.  She understands there is a very small chance for bleeding, infection, injury to normal structures (including common bile duct), conversion to open surgery, persistent symptoms, evolution of postcholecystectomy diarrhea, need for secondary interventions, anesthesia reaction, cardiopulmonary issues and other risks not specifically detailed here. I described the expected recovery, the plan for follow-up and the restrictions during the recovery phase.  All questions were answered.   All questions were answered to the satisfaction of the patient.     LVirl Cagey9/01/2021, 10:26 AM

## 2021-03-19 ENCOUNTER — Encounter (HOSPITAL_COMMUNITY): Payer: Self-pay

## 2021-03-19 ENCOUNTER — Other Ambulatory Visit: Payer: Self-pay

## 2021-03-20 ENCOUNTER — Encounter (HOSPITAL_COMMUNITY): Payer: Self-pay | Admitting: General Surgery

## 2021-03-20 ENCOUNTER — Ambulatory Visit (HOSPITAL_COMMUNITY)
Admission: RE | Admit: 2021-03-20 | Discharge: 2021-03-20 | Disposition: A | Discharge status: Home / Self Care | Payer: Medicaid Other | Attending: General Surgery | Admitting: General Surgery

## 2021-03-20 ENCOUNTER — Encounter (HOSPITAL_COMMUNITY)
Admission: RE | Disposition: A | Discharge status: Home / Self Care | Payer: Self-pay | Source: Home / Self Care | Attending: General Surgery

## 2021-03-20 ENCOUNTER — Ambulatory Visit (HOSPITAL_COMMUNITY): Payer: Medicaid Other | Admitting: Anesthesiology

## 2021-03-20 DIAGNOSIS — R519 Headache, unspecified: Secondary | ICD-10-CM | POA: Diagnosis not present

## 2021-03-20 DIAGNOSIS — K8010 Calculus of gallbladder with chronic cholecystitis without obstruction: Secondary | ICD-10-CM | POA: Insufficient documentation

## 2021-03-20 DIAGNOSIS — Z888 Allergy status to other drugs, medicaments and biological substances status: Secondary | ICD-10-CM | POA: Insufficient documentation

## 2021-03-20 DIAGNOSIS — K8000 Calculus of gallbladder with acute cholecystitis without obstruction: Secondary | ICD-10-CM | POA: Diagnosis not present

## 2021-03-20 DIAGNOSIS — M549 Dorsalgia, unspecified: Secondary | ICD-10-CM | POA: Insufficient documentation

## 2021-03-20 DIAGNOSIS — Z79899 Other long term (current) drug therapy: Secondary | ICD-10-CM | POA: Insufficient documentation

## 2021-03-20 DIAGNOSIS — Z886 Allergy status to analgesic agent status: Secondary | ICD-10-CM | POA: Insufficient documentation

## 2021-03-20 DIAGNOSIS — Z87891 Personal history of nicotine dependence: Secondary | ICD-10-CM | POA: Diagnosis not present

## 2021-03-20 DIAGNOSIS — K219 Gastro-esophageal reflux disease without esophagitis: Secondary | ICD-10-CM | POA: Diagnosis not present

## 2021-03-20 DIAGNOSIS — K8020 Calculus of gallbladder without cholecystitis without obstruction: Secondary | ICD-10-CM | POA: Diagnosis not present

## 2021-03-20 DIAGNOSIS — G8929 Other chronic pain: Secondary | ICD-10-CM | POA: Diagnosis not present

## 2021-03-20 HISTORY — PX: CHOLECYSTECTOMY: SHX55

## 2021-03-20 LAB — GLUCOSE, CAPILLARY
Glucose-Capillary: 114 mg/dL — ABNORMAL HIGH (ref 70–99)
Glucose-Capillary: 143 mg/dL — ABNORMAL HIGH (ref 70–99)

## 2021-03-20 SURGERY — LAPAROSCOPIC CHOLECYSTECTOMY
Anesthesia: General

## 2021-03-20 MED ORDER — SUCCINYLCHOLINE CHLORIDE 200 MG/10ML IV SOSY
PREFILLED_SYRINGE | INTRAVENOUS | Status: DC | PRN
  Administered 2021-03-20: 120 mg via INTRAVENOUS

## 2021-03-20 MED ORDER — SODIUM CHLORIDE 0.9 % IR SOLN
Status: DC | PRN
  Administered 2021-03-20: 1000 mL

## 2021-03-20 MED ORDER — OXYCODONE HCL 5 MG PO TABS: 5.0000 mg | ORAL_TABLET | ORAL | 0 refills | Status: DC | PRN

## 2021-03-20 MED ORDER — ROCURONIUM BROMIDE 100 MG/10ML IV SOLN
INTRAVENOUS | Status: DC | PRN
  Administered 2021-03-20: 25 mg via INTRAVENOUS
  Administered 2021-03-20: 5 mg via INTRAVENOUS

## 2021-03-20 MED ORDER — OXYCODONE HCL 5 MG PO TABS
ORAL_TABLET | ORAL | Status: AC
  Filled 2021-03-20: qty 1

## 2021-03-20 MED ORDER — MIDAZOLAM HCL 5 MG/5ML IJ SOLN
INTRAMUSCULAR | Status: DC | PRN
  Administered 2021-03-20: 2 mg via INTRAVENOUS

## 2021-03-20 MED ORDER — PROPOFOL 10 MG/ML IV BOLUS
INTRAVENOUS | Status: AC
  Filled 2021-03-20: qty 20

## 2021-03-20 MED ORDER — ONDANSETRON HCL 4 MG/2ML IJ SOLN
INTRAMUSCULAR | Status: DC | PRN
  Administered 2021-03-20: 4 mg via INTRAVENOUS

## 2021-03-20 MED ORDER — MIDAZOLAM HCL 2 MG/2ML IJ SOLN
INTRAMUSCULAR | Status: AC
  Filled 2021-03-20: qty 2

## 2021-03-20 MED ORDER — ROCURONIUM BROMIDE 10 MG/ML (PF) SYRINGE
PREFILLED_SYRINGE | INTRAVENOUS | Status: AC
  Filled 2021-03-20: qty 10

## 2021-03-20 MED ORDER — ORAL CARE MOUTH RINSE: 15.0000 mL | Freq: Once | OROMUCOSAL | Status: AC

## 2021-03-20 MED ORDER — ONDANSETRON HCL 4 MG/2ML IJ SOLN
INTRAMUSCULAR | Status: AC
  Filled 2021-03-20: qty 2

## 2021-03-20 MED ORDER — PROMETHAZINE HCL 12.5 MG PO TABS: 12.5000 mg | ORAL_TABLET | Freq: Four times a day (QID) | ORAL | 0 refills | Status: DC | PRN

## 2021-03-20 MED ORDER — LIDOCAINE HCL (CARDIAC) PF 100 MG/5ML IV SOSY
PREFILLED_SYRINGE | INTRAVENOUS | Status: DC | PRN
  Administered 2021-03-20: 100 mg via INTRAVENOUS

## 2021-03-20 MED ORDER — HEMOSTATIC AGENTS (NO CHARGE) OPTIME
TOPICAL | Status: DC | PRN
  Administered 2021-03-20: 1 via TOPICAL

## 2021-03-20 MED ORDER — LIDOCAINE HCL (PF) 2 % IJ SOLN
INTRAMUSCULAR | Status: AC
  Filled 2021-03-20: qty 5

## 2021-03-20 MED ORDER — HEMOSTATIC AGENTS (NO CHARGE) OPTIME
TOPICAL | Status: DC | PRN
  Administered 2021-03-20 (×2): 1 via TOPICAL

## 2021-03-20 MED ORDER — SCOPOLAMINE 1 MG/3DAYS TD PT72
MEDICATED_PATCH | TRANSDERMAL | Status: AC
  Filled 2021-03-20: qty 1

## 2021-03-20 MED ORDER — BUPIVACAINE HCL (PF) 0.5 % IJ SOLN
INTRAMUSCULAR | Status: DC | PRN
  Administered 2021-03-20: 20 mL

## 2021-03-20 MED ORDER — CHLORHEXIDINE GLUCONATE CLOTH 2 % EX PADS: 6.0000 | MEDICATED_PAD | Freq: Once | CUTANEOUS | Status: DC

## 2021-03-20 MED ORDER — LACTATED RINGERS IV SOLN
INTRAVENOUS | Status: DC
  Administered 2021-03-20: 1000 mL via INTRAVENOUS

## 2021-03-20 MED ORDER — BUPIVACAINE LIPOSOME 1.3 % IJ SUSP
INTRAMUSCULAR | Status: AC
  Filled 2021-03-20: qty 20

## 2021-03-20 MED ORDER — PROPOFOL 10 MG/ML IV BOLUS
INTRAVENOUS | Status: DC | PRN
  Administered 2021-03-20: 150 mg via INTRAVENOUS

## 2021-03-20 MED ORDER — SODIUM CHLORIDE 0.9 % IV SOLN
2.0000 g | INTRAVENOUS | Status: AC
  Administered 2021-03-20: 2 g via INTRAVENOUS
  Filled 2021-03-20: qty 2

## 2021-03-20 MED ORDER — DEXAMETHASONE SODIUM PHOSPHATE 10 MG/ML IJ SOLN
INTRAMUSCULAR | Status: AC
  Filled 2021-03-20: qty 1

## 2021-03-20 MED ORDER — CHLORHEXIDINE GLUCONATE 0.12 % MT SOLN
15.0000 mL | Freq: Once | OROMUCOSAL | Status: AC
  Administered 2021-03-20: 15 mL via OROMUCOSAL
  Filled 2021-03-20: qty 15

## 2021-03-20 MED ORDER — FENTANYL CITRATE (PF) 250 MCG/5ML IJ SOLN
INTRAMUSCULAR | Status: AC
  Filled 2021-03-20: qty 5

## 2021-03-20 MED ORDER — ONDANSETRON HCL 4 MG/2ML IJ SOLN: 4.0000 mg | Freq: Once | INTRAMUSCULAR | Status: DC | PRN

## 2021-03-20 MED ORDER — SUGAMMADEX SODIUM 200 MG/2ML IV SOLN
INTRAVENOUS | Status: DC | PRN
  Administered 2021-03-20: 150 mg via INTRAVENOUS

## 2021-03-20 MED ORDER — SCOPOLAMINE 1 MG/3DAYS TD PT72
1.0000 | MEDICATED_PATCH | TRANSDERMAL | Status: DC
  Administered 2021-03-20: 1.5 mg via TRANSDERMAL

## 2021-03-20 MED ORDER — FENTANYL CITRATE PF 50 MCG/ML IJ SOSY
25.0000 ug | PREFILLED_SYRINGE | INTRAMUSCULAR | Status: DC | PRN
  Administered 2021-03-20 (×2): 50 ug via INTRAVENOUS
  Filled 2021-03-20 (×2): qty 1

## 2021-03-20 MED ORDER — OXYCODONE HCL 5 MG PO TABS
5.0000 mg | ORAL_TABLET | Freq: Once | ORAL | Status: AC
  Administered 2021-03-20: 5 mg via ORAL

## 2021-03-20 MED ORDER — DEXAMETHASONE SODIUM PHOSPHATE 10 MG/ML IJ SOLN
INTRAMUSCULAR | Status: DC | PRN
  Administered 2021-03-20: 10 mg via INTRAVENOUS

## 2021-03-20 MED ORDER — FENTANYL CITRATE (PF) 100 MCG/2ML IJ SOLN
INTRAMUSCULAR | Status: DC | PRN
  Administered 2021-03-20 (×2): 100 ug via INTRAVENOUS
  Administered 2021-03-20: 50 ug via INTRAVENOUS

## 2021-03-20 SURGICAL SUPPLY — 44 items
ADH SKN CLS APL DERMABOND .7 (GAUZE/BANDAGES/DRESSINGS) ×1
APL PRP STRL LF DISP 70% ISPRP (MISCELLANEOUS) ×1
APL SRG 38 LTWT LNG FL B (MISCELLANEOUS) ×1
APPLICATOR ARISTA FLEXITIP XL (MISCELLANEOUS) ×2 IMPLANT
APPLIER CLIP ROT 10 11.4 M/L (STAPLE) ×2
APR CLP MED LRG 11.4X10 (STAPLE) ×1
BAG RETRIEVAL 10 (BASKET) ×1
BLADE SURG 15 STRL LF DISP TIS (BLADE) ×1 IMPLANT
BLADE SURG 15 STRL SS (BLADE) ×2
CHLORAPREP W/TINT 26 (MISCELLANEOUS) ×2 IMPLANT
CLIP APPLIE ROT 10 11.4 M/L (STAPLE) ×1 IMPLANT
CLOTH BEACON ORANGE TIMEOUT ST (SAFETY) ×2 IMPLANT
COVER LIGHT HANDLE STERIS (MISCELLANEOUS) ×4 IMPLANT
DECANTER SPIKE VIAL GLASS SM (MISCELLANEOUS) ×2 IMPLANT
DERMABOND ADVANCED (GAUZE/BANDAGES/DRESSINGS) ×1
DERMABOND ADVANCED .7 DNX12 (GAUZE/BANDAGES/DRESSINGS) ×1 IMPLANT
ELECT REM PT RETURN 9FT ADLT (ELECTROSURGICAL) ×2
ELECTRODE REM PT RTRN 9FT ADLT (ELECTROSURGICAL) ×1 IMPLANT
GLOVE SURG ENC MOIS LTX SZ6.5 (GLOVE) ×2 IMPLANT
GLOVE SURG UNDER POLY LF SZ6.5 (GLOVE) ×2 IMPLANT
GLOVE SURG UNDER POLY LF SZ7 (GLOVE) ×6 IMPLANT
GOWN STRL REUS W/TWL LRG LVL3 (GOWN DISPOSABLE) ×6 IMPLANT
HEMOSTAT ARISTA ABSORB 3G PWDR (HEMOSTASIS) ×2 IMPLANT
HEMOSTAT SNOW SURGICEL 2X4 (HEMOSTASIS) ×4 IMPLANT
INST SET LAPROSCOPIC AP (KITS) ×2 IMPLANT
KIT TURNOVER KIT A (KITS) ×2 IMPLANT
LIGASURE LAP ATLAS 10MM 37CM (INSTRUMENTS) ×2 IMPLANT
MANIFOLD NEPTUNE II (INSTRUMENTS) ×2 IMPLANT
NEEDLE INSUFFLATION 14GA 120MM (NEEDLE) ×2 IMPLANT
NS IRRIG 1000ML POUR BTL (IV SOLUTION) ×2 IMPLANT
PACK LAP CHOLE LZT030E (CUSTOM PROCEDURE TRAY) ×2 IMPLANT
PAD ARMBOARD 7.5X6 YLW CONV (MISCELLANEOUS) ×2 IMPLANT
SET BASIN LINEN APH (SET/KITS/TRAYS/PACK) ×2 IMPLANT
SET TUBE SMOKE EVAC HIGH FLOW (TUBING) ×2 IMPLANT
SLEEVE ENDOPATH XCEL 5M (ENDOMECHANICALS) ×2 IMPLANT
SUT MNCRL AB 4-0 PS2 18 (SUTURE) ×4 IMPLANT
SUT VICRYL 0 UR6 27IN ABS (SUTURE) ×2 IMPLANT
SYS BAG RETRIEVAL 10MM (BASKET) ×1
SYSTEM BAG RETRIEVAL 10MM (BASKET) ×1 IMPLANT
TROCAR ENDO BLADELESS 11MM (ENDOMECHANICALS) ×2 IMPLANT
TROCAR XCEL NON-BLD 5MMX100MML (ENDOMECHANICALS) ×2 IMPLANT
TROCAR XCEL UNIV SLVE 11M 100M (ENDOMECHANICALS) ×2 IMPLANT
TUBE CONNECTING 12X1/4 (SUCTIONS) ×2 IMPLANT
WARMER LAPAROSCOPE (MISCELLANEOUS) ×2 IMPLANT

## 2021-03-20 NOTE — Transfer of Care (Signed)
Immediate Anesthesia Transfer of Care Note  Patient: Destiny Beck  Procedure(s) Performed: LAPAROSCOPIC CHOLECYSTECTOMY  Patient Location: PACU  Anesthesia Type:General  Level of Consciousness: awake and alert   Airway & Oxygen Therapy: Patient Spontanous Breathing and Patient connected to nasal cannula oxygen  Post-op Assessment: Report given to RN and Post -op Vital signs reviewed and stable  Post vital signs: Reviewed and stable  Last Vitals:  Vitals Value Taken Time  BP    Temp    Pulse 106 03/20/21 1219  Resp 18 03/20/21 1219  SpO2 96 % 03/20/21 1219  Vitals shown include unvalidated device data.  Last Pain:  Vitals:   03/20/21 0936  TempSrc: Oral  PainSc: 4       Patients Stated Pain Goal: 6 ([id number])  Complications: No notable events documented.

## 2021-03-20 NOTE — Op Note (Addendum)
Operative Note   Preoperative Diagnosis: Symptomatic cholelithiasis   Postoperative Diagnosis: Same   Procedure(s) Performed: Laparoscopic cholecystectomy   Surgeon: Ria Comment C. Constance Haw, MD   Assistants: Aviva Signs, MD   Anesthesia: General endotracheal   Anesthesiologist: Louann Sjogren, MD    Specimens: Gallbladder    Estimated Blood Loss: Minimal    Blood Replacement: None    Complications: None    Operative Findings: Gallbladder with long thin cystic duct, falciform with only a small attachment to liver and opening over liver, risk for internal hernia so falciform was divided with a ligasure    Procedure: The patient was taken to the operating room and placed supine. General endotracheal anesthesia was induced. Intravenous antibiotics were  administered per protocol. An orogastric tube positioned to decompress the stomach. The abdomen was prepared and draped in the usual sterile fashion.    A supraumbilical incision was made and a Veress technique was utilized to achieve pneumoperitoneum to 15 mmHg with carbon dioxide. A 11 mm optiview port was placed through the supraumbilical region, and a 10 mm 0-degree operative laparoscope was introduced. The area underlying the trocar and Veress needle were inspected and without evidence of injury.  Remaining trocars were placed under direct vision. Two 5 mm ports were placed in the right abdomen, between the anterior axillary and midclavicular line.  A final 11 mm port was placed through the mid-epigastrium, near the falciform ligament. Her falciform was very thin and only attached to the tip of the liver and was open over the liver. This could potentially cause an internal hernia in the future, so the falciform  was divided with a ligasure and clips were also placed on both ends. This separated it from the 1 inch attachment it had to the liver.    The gallbladder fundus was elevated cephalad and the infundibulum was retracted to the  patient's right. The gallbladder/cystic duct junction was skeletonized. The cystic artery noted in the triangle of Calot and was also skeletonized.  We then continued liberal medial and lateral dissection until the critical view of safety was achieved.    The cystic duct and cystic artery were triply clipped and divided. The gallbladder was then dissected from the liver bed with electrocautery. There was spillage of bile that was suctioned. The specimen was placed in an Endopouch and was retrieved through the epigastric site.   Final inspection revealed acceptable hemostasis. Arista and Surgical SNOW X2 were placed in the gallbladder bed.  Trocars were removed and pneumoperitoneum was released.  The epigastric and umbilical port sites were smaller than the tip of my finger.  Skin incisions were closed with 4-0 Monocryl subcuticular sutures and Dermabond. The patient was awakened from anesthesia and extubated without complication.    Curlene Labrum, MD [facility] [address] [place], [place] [address] [phone] (office)

## 2021-03-20 NOTE — Discharge Instructions (Signed)
Discharge Laparoscopic Surgery Instructions:  Common Complaints: Right shoulder pain is common after laparoscopic surgery. This is secondary to the gas used in the surgery being trapped under the diaphragm.  Walk to help your body absorb the gas. This will improve in a few days. Pain at the port sites are common, especially the larger port sites. This will improve with time.  Some nausea is common and poor appetite. The main goal is to stay hydrated the first few days after surgery.   Diet/ Activity: Diet as tolerated. You may not have an appetite, but it is important to stay hydrated. Drink 64 ounces of water a day. Your appetite will return with time.  Shower per your regular routine daily.  Do not take hot showers. Take warm showers that are less than 10 minutes. Rest and listen to your body, but do not remain in bed all day.  Walk everyday for at least 15-20 minutes. Deep cough and move around every 1-2 hours in the first few days after surgery.  Do not lift > 10 lbs, perform excessive bending, pushing, pulling, squatting for 1-2 weeks after surgery.  Do not pick at the dermabond glue on your incision sites.  This glue film will remain in place for 1-2 weeks and will start to peel off.  Do not place lotions or balms on your incision unless instructed to specifically by Dr. Gilberta Peeters.   Pain Expectations and Narcotics: -After surgery you will have pain associated with your incisions and this is normal. The pain is muscular and nerve pain, and will get better with time. -You are encouraged and expected to take non narcotic medications like tylenol and ibuprofen (when able) to treat pain as multiple modalities can aid with pain treatment. -Narcotics are only used when pain is severe or there is breakthrough pain. -You are not expected to have a pain score of 0 after surgery, as we cannot prevent pain. A pain score of 3-4 that allows you to be functional, move, walk, and tolerate some activity is  the goal. The pain will continue to improve over the days after surgery and is dependent on your surgery. -Due to [place] law, we are only able to give a certain amount of pain medication to treat post operative pain, and we only give additional narcotics on a patient by patient basis.  -For most laparoscopic surgery, studies have shown that the majority of patients only need 10-15 narcotic pills, and for open surgeries most patients only need 15-20.   -Having appropriate expectations of pain and knowledge of pain management with non narcotics is important as we do not want anyone to become addicted to narcotic pain medication.  -Using ice packs in the first 48 hours and heating pads after 48 hours, wearing an abdominal binder (when recommended), and using over the counter medications are all ways to help with pain management.   -Simple acts like meditation and mindfulness practices after surgery can also help with pain control and research has proven the benefit of these practices.  Medication: Take tylenol and ibuprofen as needed for pain control, alternating every 4-6 hours.  Example:  Tylenol 1000mg @ 6am, 12noon, 6pm, 12midnight (Do not exceed 4000mg of tylenol a day). Ibuprofen 800mg @ 9am, 3pm, 9pm, 3am (Do not exceed 3600mg of ibuprofen a day).  Take Roxicodone for breakthrough pain every 4 hours.  Take Colace for constipation related to narcotic pain medication. If you do not have a bowel movement in 2 days, take Miralax   over the counter.  Drink plenty of water to also prevent constipation.   Contact Information: If you have questions or concerns, please call our office, [phone], Monday- Thursday 8AM-5PM and Friday 8AM-12Noon.  If it is after hours or on the weekend, please call [facility] Main Number, [phone], [phone], and ask to speak to the surgeon on call for Dr. Tyesha Joffe at [place].   

## 2021-03-20 NOTE — Anesthesia Preprocedure Evaluation (Signed)
Anesthesia Evaluation  Patient identified by MRN, date of birth, ID band Patient awake    Reviewed: Allergy & Precautions, H&P , NPO status , Patient's Chart, lab work & pertinent test results, reviewed documented beta blocker date and time   Airway Mallampati: II  TM Distance: >3 FB Neck ROM: full    Dental no notable dental hx.    Pulmonary neg pulmonary ROS, former smoker,    Pulmonary exam normal breath sounds clear to auscultation       Cardiovascular Exercise Tolerance: Good hypertension, negative cardio ROS   Rhythm:regular Rate:Normal     Neuro/Psych  Headaches, PSYCHIATRIC DISORDERS Anxiety Depression  Neuromuscular disease    GI/Hepatic Neg liver ROS, GERD  Medicated,  Endo/Other  negative endocrine ROSdiabetes  Renal/GU negative Renal ROS  negative genitourinary   Musculoskeletal   Abdominal   Peds  Hematology  (+) Blood dyscrasia, anemia ,   Anesthesia Other Findings   Reproductive/Obstetrics negative OB ROS                             Anesthesia Physical Anesthesia Plan  ASA: 2  Anesthesia Plan: General and General ETT   Post-op Pain Management:    Induction:   PONV Risk Score and Plan: Ondansetron  Airway Management Planned:   Additional Equipment:   Intra-op Plan:   Post-operative Plan:   Informed Consent: I have reviewed the patients History and Physical, chart, labs and discussed the procedure including the risks, benefits and alternatives for the proposed anesthesia with the patient or authorized representative who has indicated his/her understanding and acceptance.     Dental Advisory Given  Plan Discussed with: CRNA  Anesthesia Plan Comments:         Anesthesia Quick Evaluation

## 2021-03-20 NOTE — Progress Notes (Signed)
[facility]  Called her daughter updated surgery completed. Pain meds sent in pharmacy and phenergen sent in. Will do post op phone call in 2 weeks.   Curlene Labrum, MD [facility] [address] [place], [place] [address] [phone] (office)

## 2021-03-20 NOTE — Anesthesia Postprocedure Evaluation (Signed)
Anesthesia Post Note  Patient: Destiny Beck  Procedure(s) Performed: [place]  Patient location during evaluation: Phase II Anesthesia Type: General Level of consciousness: awake Pain management: pain level controlled Vital Signs Assessment: post-procedure vital signs reviewed and stable Respiratory status: spontaneous breathing and respiratory function stable Cardiovascular status: blood pressure returned to baseline and stable Postop Assessment: no headache and no apparent nausea or vomiting Anesthetic complications: no Comments: Late entry   No notable events documented.   Last Vitals:  Vitals:   03/20/21 1230 03/20/21 1323  BP: (!) 161/98 (!) 138/96  Pulse: (!) 52   Resp: 13 18  Temp:  36.4 C  SpO2: 99% 93%    Last Pain:  Vitals:   03/20/21 1323  TempSrc: Oral  PainSc: [place]

## 2021-03-20 NOTE — Interval H&P Note (Signed)
History and Physical Interval Note:  03/20/2021 10:43 AM  Destiny Beck  has presented today for surgery, with the diagnosis of Cholelithiasis.  The various methods of treatment have been discussed with the patient and family. After consideration of risks, benefits and other options for treatment, the patient has consented to  Procedure(s): LAPAROSCOPIC CHOLECYSTECTOMY (N/A) as a surgical intervention.  The patient's history has been reviewed, patient examined, no change in status, stable for surgery.  I have reviewed the patient's chart and labs.  Questions were answered to the patient's satisfaction.     Virl Cagey

## 2021-03-20 NOTE — Anesthesia Procedure Notes (Signed)
Procedure Name: Intubation Date/Time: 03/20/2021 11:08 AM Performed by: Riki Sheer, CRNA Pre-anesthesia Checklist: Patient identified, Emergency Drugs available, Suction available, Patient being monitored and Timeout performed Patient Re-evaluated:Patient Re-evaluated prior to induction Oxygen Delivery Method: Circle system utilized Preoxygenation: Pre-oxygenation with 100% oxygen Induction Type: IV induction and Rapid sequence Laryngoscope Size: Miller and 2 Grade View: Grade I Tube type: Oral Tube size: 7.0 mm Number of attempts: 1 Airway Equipment and Method: Stylet Placement Confirmation: ETT inserted through vocal cords under direct vision, positive ETCO2, breath sounds checked- equal and bilateral and CO2 detector Secured at: 21 cm Tube secured with: Tape Dental Injury: Teeth and Oropharynx as per pre-operative assessment  Comments: Pt had been nauseous preoperatively, so RSI performed atraumatically

## 2021-03-23 ENCOUNTER — Encounter (HOSPITAL_COMMUNITY): Payer: Self-pay | Admitting: General Surgery

## 2021-03-23 LAB — SURGICAL PATHOLOGY

## 2021-06-22 ENCOUNTER — Telehealth: Payer: Self-pay | Admitting: General Surgery

## 2021-06-22 DIAGNOSIS — R112 Nausea with vomiting, unspecified: Secondary | ICD-10-CM

## 2021-06-22 NOTE — Telephone Encounter (Signed)
Received call from patient stating that she is still having  nausea since having her gallbladder removed. Would like to know what her next step is.

## 2021-06-24 MED ORDER — METOCLOPRAMIDE HCL 5 MG PO TABS: 5.0000 mg | ORAL_TABLET | Freq: Four times a day (QID) | ORAL | 0 refills | Status: DC

## 2021-06-24 NOTE — Telephone Encounter (Signed)
Call placed to patient and patient made aware.   Verbalized understanding.   Prescription sent to pharmacy.   Referral orders placed.

## 2021-06-25 ENCOUNTER — Encounter (INDEPENDENT_AMBULATORY_CARE_PROVIDER_SITE_OTHER): Payer: Self-pay | Admitting: *Deleted

## 2021-07-14 ENCOUNTER — Other Ambulatory Visit: Payer: Self-pay

## 2021-07-14 ENCOUNTER — Encounter (INDEPENDENT_AMBULATORY_CARE_PROVIDER_SITE_OTHER): Payer: Self-pay | Admitting: Gastroenterology

## 2021-07-14 ENCOUNTER — Ambulatory Visit (INDEPENDENT_AMBULATORY_CARE_PROVIDER_SITE_OTHER): Payer: Medicaid Other | Admitting: Gastroenterology

## 2021-07-14 VITALS — BP 108/69 | HR 76 | Temp 98.4°F | Ht 60.0 in | Wt 161.4 lb

## 2021-07-14 DIAGNOSIS — K219 Gastro-esophageal reflux disease without esophagitis: Secondary | ICD-10-CM | POA: Diagnosis not present

## 2021-07-14 DIAGNOSIS — R110 Nausea: Secondary | ICD-10-CM | POA: Insufficient documentation

## 2021-07-14 MED ORDER — SUCRALFATE 1 GM/10ML PO SUSP: 1.0000 g | Freq: Three times a day (TID) | ORAL | 3 refills | Status: DC

## 2021-07-14 MED ORDER — DEXLANSOPRAZOLE 60 MG PO CPDR: 60.0000 mg | DELAYED_RELEASE_CAPSULE | Freq: Every day | ORAL | 3 refills | Status: DC

## 2021-07-14 NOTE — Patient Instructions (Signed)
I have sent a refill of dexilant to [facility] in [place] I have also sent carafate suspension for you to try, this will help coat your esophagus and may help with the acid reflux/nausea you are experiencing. You will take this before meals and at bedtime. If this is too expensive, please let me know. Please continue to avoid reflux triggers such as spicy, greasy, tomato based foods, caffeine and chocolate. If EGD is unrevealing of cause of your nausea, we may need to discuss further testing or the possibility of your anxiety medications contributing to your symptoms.   Follow up in 3 months

## 2021-07-14 NOTE — Progress Notes (Signed)
Referring Provider: Virl Cagey, MD Primary Care Physician:  Bridget Hartshorn, NP Primary GI Physician: Rehman  Chief Complaint  Patient presents with   Gastroesophageal Reflux    Patient here today s/p gallbladder removal Sept 2022. She states she is having some nausea and feeling of about to vomit and as this feeling occurs she sneezes. Has history of gerd and is having issues with getting her dexilant as insurance no longer paying for. She is taking otc tums in the place of Dexilant. History of anemia. Denies dark or bloody stools.   HPI:   Destiny Beck is a 51 y.o. female with past medical history of anemia, anxiety, arthritis, fatigue, back pain, depression, DM, fibromyalgia, GERD, HTN, occipital neuralgia.   Patient presenting today for GERD and nausea.  Patient states that she had her gallbladder removed in September, she was having nausea prior to cholecystectomy that was thought secondary to her gallbladder, however, she continues to have nausea still. Nausea occurs daily, does not seem to be precipitated by anything. She states that sneezing relieves her nausea. She also reports ongoing, uncontrolled GERD, she was on dexilant previously for but now her insurance does not want to cover it. She is currently taking tums everyday. She has been using a CBD pen which seems to take the edge off of her nausea. She reports that since being off of her dexilant, nausea is much worse. She denies any abdominal pain, she does not have any episodes of vomiting. She has been taking tums since being off of her dexilant. She reports that she takes NSAIDs only occasionally. She denies any changes in appetite or weight loss, denies rectal bleeding or melena. She does have some intermittent bloating as well, especially with certain foods.   Last Colonoscopy:08/04/18- The entire examined colon is normal. - External hemorrhoids. - No specimens collected.  Last Endoscopy:08/04/18- Normal  proximal esophagus, mid esophagus and distal esophagus. - LA Grade A reflux esophagitis. - 2 cm hiatal hernia. - Scar in the prepyloric region of the stomach. - Gastritis. (Chronic inactive gastritis) - Normal duodenal bulb and second portion of the duodenum.  Past Medical History:  Diagnosis Date   Abscess    Anemia    Anxiety    Arthritis    Chronic back pain    Chronic fatigue    Chronic neck pain    Depression    Diabetes mellitus without complication (HCC)    Dizziness    Fibromyalgia    GERD (gastroesophageal reflux disease)    Headache    Hypertension    Occipital neuralgia    Sciatica     Past Surgical History:  Procedure Laterality Date   BIOPSY  08/04/2018   Procedure: BIOPSY;  Surgeon: Rogene Houston, MD;  Location: [facility];  Service: Endoscopy;;  gastric    CESAREAN SECTION     x2   CHOLECYSTECTOMY N/A 03/20/2021   Procedure: LAPAROSCOPIC CHOLECYSTECTOMY;  Surgeon: Virl Cagey, MD;  Location: [facility];  Service: General;  Laterality: N/A;   COLONOSCOPY WITH PROPOFOL N/A 08/04/2018   Procedure: COLONOSCOPY WITH PROPOFOL;  Surgeon: Rogene Houston, MD;  Location: [facility];  Service: Endoscopy;  Laterality: N/A;  11.50   DILATION AND CURETTAGE OF UTERUS     with ablation   ESOPHAGOGASTRODUODENOSCOPY (EGD) WITH PROPOFOL N/A 08/04/2018   Procedure: ESOPHAGOGASTRODUODENOSCOPY (EGD) WITH PROPOFOL;  Surgeon: Rogene Houston, MD;  Location: [facility];  Service: Endoscopy;  Laterality: N/A;  TUBAL LIGATION      Current Outpatient Medications  Medication Sig Dispense Refill   albuterol (VENTOLIN HFA) 108 (90 Base) MCG/ACT inhaler Inhale 1-2 puffs into the lungs every 6 (six) hours as needed for wheezing or shortness of breath.      buPROPion (WELLBUTRIN XL) 300 MG 24 hr tablet Take 300 mg by mouth daily.     COMBIPATCH 0.05-0.14 MG/DAY Place 1 patch onto the skin 2 (two) times a week.      cyanocobalamin 1000 MCG tablet Take 1,000 mcg by mouth  daily.      estradiol (ESTRACE) 1 MG tablet Take 1 mg by mouth in the morning.      ferrous sulfate 325 (65 FE) MG EC tablet Take 325 mg by mouth daily with breakfast.      gabapentin (NEURONTIN) 600 MG tablet Take 600 mg by mouth 3 (three) times daily.      hydrOXYzine (ATARAX/VISTARIL) 25 MG tablet Take 50 mg by mouth 3 (three) times daily as needed for anxiety or itching.     meclizine (ANTIVERT) 25 MG tablet Take 1-2 tablets (25-50 mg total) by mouth 3 (three) times daily as needed for dizziness. 30 tablet 0   metFORMIN (GLUCOPHAGE) 500 MG tablet Take 1,000 mg by mouth 2 (two) times daily with a meal.     metoCLOPramide (REGLAN) 5 MG tablet Take 1 tablet (5 mg total) by mouth 4 (four) times daily. 120 tablet 0   ondansetron (ZOFRAN ODT) 4 MG disintegrating tablet Take 1 tablet (4 mg total) by mouth every 8 (eight) hours as needed for nausea or vomiting. 20 tablet 0   prazosin (MINIPRESS) 2 MG capsule Take 2 mg by mouth at bedtime.     promethazine (PHENERGAN) 12.5 MG tablet Take 1 tablet (12.5 mg total) by mouth every 6 (six) hours as needed for refractory nausea / vomiting. 30 tablet 0   rizatriptan (MAXALT) 10 MG tablet Take 10 mg by mouth as needed for migraine.      venlafaxine XR (EFFEXOR-XR) 75 MG 24 hr capsule Take 225 mg by mouth in the morning.      DEXILANT 60 MG capsule Take 1 capsule by mouth daily. (Patient not taking: Reported on 07/14/2021)     oxyCODONE (ROXICODONE) 5 MG immediate release tablet Take 1 tablet (5 mg total) by mouth every 4 (four) hours as needed. (Patient not taking: Reported on 07/14/2021) 20 tablet 0   No current facility-administered medications for this visit.    Allergies as of 07/14/2021 - Review Complete 07/14/2021  Allergen Reaction Noted   Cymbalta [duloxetine hcl] Other (See Comments) 08/24/2020   Tylenol [acetaminophen] Other (See Comments) 08/24/2020   Ibuprofen Other (See Comments) 12/10/2019    History reviewed. No pertinent family  history.  Social History   Socioeconomic History   Marital status: Divorced    Spouse name: Not on file   Number of children: Not on file   Years of education: Not on file   Highest education level: Not on file  Occupational History   Not on file  Tobacco Use   Smoking status: Former    Packs/day: 0.50    Years: 30.00    Pack years: 15.00    Types: Cigarettes    Quit date: 01/07/2017    Years since quitting: 4.5   Smokeless tobacco: Never  Vaping Use   Vaping Use: Never used  Substance and Sexual Activity   Alcohol use: Yes    Comment: occ  Drug use: Yes    Types: Marijuana    Comment: occ   Sexual activity: Yes    Birth control/protection: Surgical  Other Topics Concern   Not on file  Social History Narrative   Not on file   Social Determinants of Health   Financial Resource Strain: Not on file  Food Insecurity: Not on file  Transportation Needs: Not on file  Physical Activity: Not on file  Stress: Not on file  Social Connections: Not on file   Review of systems General: negative for malaise, night sweats, fever, chills, weight loss Neck: Negative for lumps, goiter, pain and significant neck swelling Resp: Negative for cough, wheezing, dyspnea at rest CV: Negative for chest pain, leg swelling, palpitations, orthopnea GI: denies melena, hematochezia, vomiting, diarrhea, constipation, dysphagia, odyonophagia, early satiety or unintentional weight loss. +nausea +reflux MSK: Negative for joint pain or swelling, back pain, and muscle pain. Derm: Negative for itching or rash Psych: Denies depression, anxiety, memory loss, confusion. No homicidal or suicidal ideation.  Heme: Negative for prolonged bleeding, bruising easily, and swollen nodes. Endocrine: Negative for cold or heat intolerance, polyuria, polydipsia and goiter. Neuro: negative for tremor, gait imbalance, syncope and seizures. The remainder of the review of systems is noncontributory.  Physical  Exam: BP 108/69 (BP Location: Left Arm, Patient Position: Sitting, Cuff Size: Large)    Pulse 76    Temp 98.4 F (36.9 C) (Oral)    Ht 5' (1.524 m)    Wt 161 lb 6.4 oz (73.2 kg)    BMI 31.52 kg/m  General:   Alert and oriented. No distress noted. Pleasant and cooperative.  Head:  Normocephalic and atraumatic. Eyes:  Conjuctiva clear without scleral icterus. Mouth:  Oral mucosa pink and moist. Good dentition. No lesions. Heart: Normal rate and rhythm, s1 and s2 heart sounds present.  Lungs: Clear lung sounds in all lobes. Respirations equal and unlabored. Abdomen:  +BS, soft, non-tender and non-distended. No rebound or guarding. No HSM or masses noted. Derm: No palmar erythema or jaundice Msk:  Symmetrical without gross deformities. Normal posture. Extremities:  Without edema. Neurologic:  Alert and  oriented x4 Psych:  Alert and cooperative. Normal mood and affect.  Invalid input(s): 6 MONTHS   ASSESSMENT: Destiny Beck is a 51 y.o. female presenting today for ongoing GERD and nausea.  Nausea has been present since a few months prior to cholecystectomy in September, initially thought to be related to her gallbladder, however, it has continued since gallbladder removal. Nausea improves with sneezing, this is likely related to the vagus nerve, however, we cannot rule out underlying initial cause of her nausea such as PUD, gastritis, or even H Pylori. We will proceed with EGD for further evaluation. Uncontrolled reflux since being off of her dexilant has also worsened her nausea. I am going to resend Rx for dexilant, if not covered initially, we may be able to do a PA as she has failed other PPIs in the past and dexilant seems to be the only PPI that controls her symptoms. I am also sending carafate to see if this helps with her nausea/ongoing reflux.   Indications, risks and benefits of procedure discussed in detail with patient. Patient verbalized understanding and is in agreement to  proceed with EGD at this time.   PLAN:  Refill of dexilant 60mg  once daily sent (may need PA) 2. Carafate 1g QID 3. Schedule EGD 4. May need to consider other causes of nausea (neurologic/anxiety medications if EGD is unrevealing)  Follow Up: 3 months  Shanesha Bednarz L. Alver Sorrow, MSN, APRN, AGNP-C Adult-Gerontology Nurse Practitioner [facility]

## 2021-07-14 NOTE — H&P (View-Only) (Signed)
Referring Provider: Virl Cagey, MD Primary Care Physician:  Bridget Hartshorn, NP Primary GI Physician: Rehman  Chief Complaint  Patient presents with   Gastroesophageal Reflux    Patient here today s/p gallbladder removal Sept 2022. She states she is having some nausea and feeling of about to vomit and as this feeling occurs she sneezes. Has history of gerd and is having issues with getting her dexilant as insurance no longer paying for. She is taking otc tums in the place of Dexilant. History of anemia. Denies dark or bloody stools.   HPI:   Destiny Beck is a 51 y.o. female with past medical history of anemia, anxiety, arthritis, fatigue, back pain, depression, DM, fibromyalgia, GERD, HTN, occipital neuralgia.   Patient presenting today for GERD and nausea.  Patient states that she had her gallbladder removed in September, she was having nausea prior to cholecystectomy that was thought secondary to her gallbladder, however, she continues to have nausea still. Nausea occurs daily, does not seem to be precipitated by anything. She states that sneezing relieves her nausea. She also reports ongoing, uncontrolled GERD, she was on dexilant previously for but now her insurance does not want to cover it. She is currently taking tums everyday. She has been using a CBD pen which seems to take the edge off of her nausea. She reports that since being off of her dexilant, nausea is much worse. She denies any abdominal pain, she does not have any episodes of vomiting. She has been taking tums since being off of her dexilant. She reports that she takes NSAIDs only occasionally. She denies any changes in appetite or weight loss, denies rectal bleeding or melena. She does have some intermittent bloating as well, especially with certain foods.   Last Colonoscopy:08/04/18- The entire examined colon is normal. - External hemorrhoids. - No specimens collected.  Last Endoscopy:08/04/18- Normal  proximal esophagus, mid esophagus and distal esophagus. - LA Grade A reflux esophagitis. - 2 cm hiatal hernia. - Scar in the prepyloric region of the stomach. - Gastritis. (Chronic inactive gastritis) - Normal duodenal bulb and second portion of the duodenum.  Past Medical History:  Diagnosis Date   Abscess    Anemia    Anxiety    Arthritis    Chronic back pain    Chronic fatigue    Chronic neck pain    Depression    Diabetes mellitus without complication (HCC)    Dizziness    Fibromyalgia    GERD (gastroesophageal reflux disease)    Headache    Hypertension    Occipital neuralgia    Sciatica     Past Surgical History:  Procedure Laterality Date   BIOPSY  08/04/2018   Procedure: BIOPSY;  Surgeon: Rogene Houston, MD;  Location: [facility];  Service: Endoscopy;;  gastric    CESAREAN SECTION     x2   CHOLECYSTECTOMY N/A 03/20/2021   Procedure: LAPAROSCOPIC CHOLECYSTECTOMY;  Surgeon: Virl Cagey, MD;  Location: [facility];  Service: General;  Laterality: N/A;   COLONOSCOPY WITH PROPOFOL N/A 08/04/2018   Procedure: COLONOSCOPY WITH PROPOFOL;  Surgeon: Rogene Houston, MD;  Location: [facility];  Service: Endoscopy;  Laterality: N/A;  11.50   DILATION AND CURETTAGE OF UTERUS     with ablation   ESOPHAGOGASTRODUODENOSCOPY (EGD) WITH PROPOFOL N/A 08/04/2018   Procedure: ESOPHAGOGASTRODUODENOSCOPY (EGD) WITH PROPOFOL;  Surgeon: Rogene Houston, MD;  Location: [facility];  Service: Endoscopy;  Laterality: N/A;  TUBAL LIGATION      Current Outpatient Medications  Medication Sig Dispense Refill   albuterol (VENTOLIN HFA) 108 (90 Base) MCG/ACT inhaler Inhale 1-2 puffs into the lungs every 6 (six) hours as needed for wheezing or shortness of breath.      buPROPion (WELLBUTRIN XL) 300 MG 24 hr tablet Take 300 mg by mouth daily.     COMBIPATCH 0.05-0.14 MG/DAY Place 1 patch onto the skin 2 (two) times a week.      cyanocobalamin 1000 MCG tablet Take 1,000 mcg by mouth  daily.      estradiol (ESTRACE) 1 MG tablet Take 1 mg by mouth in the morning.      ferrous sulfate 325 (65 FE) MG EC tablet Take 325 mg by mouth daily with breakfast.      gabapentin (NEURONTIN) 600 MG tablet Take 600 mg by mouth 3 (three) times daily.      hydrOXYzine (ATARAX/VISTARIL) 25 MG tablet Take 50 mg by mouth 3 (three) times daily as needed for anxiety or itching.     meclizine (ANTIVERT) 25 MG tablet Take 1-2 tablets (25-50 mg total) by mouth 3 (three) times daily as needed for dizziness. 30 tablet 0   metFORMIN (GLUCOPHAGE) 500 MG tablet Take 1,000 mg by mouth 2 (two) times daily with a meal.     metoCLOPramide (REGLAN) 5 MG tablet Take 1 tablet (5 mg total) by mouth 4 (four) times daily. 120 tablet 0   ondansetron (ZOFRAN ODT) 4 MG disintegrating tablet Take 1 tablet (4 mg total) by mouth every 8 (eight) hours as needed for nausea or vomiting. 20 tablet 0   prazosin (MINIPRESS) 2 MG capsule Take 2 mg by mouth at bedtime.     promethazine (PHENERGAN) 12.5 MG tablet Take 1 tablet (12.5 mg total) by mouth every 6 (six) hours as needed for refractory nausea / vomiting. 30 tablet 0   rizatriptan (MAXALT) 10 MG tablet Take 10 mg by mouth as needed for migraine.      venlafaxine XR (EFFEXOR-XR) 75 MG 24 hr capsule Take 225 mg by mouth in the morning.      DEXILANT 60 MG capsule Take 1 capsule by mouth daily. (Patient not taking: Reported on 07/14/2021)     oxyCODONE (ROXICODONE) 5 MG immediate release tablet Take 1 tablet (5 mg total) by mouth every 4 (four) hours as needed. (Patient not taking: Reported on 07/14/2021) 20 tablet 0   No current facility-administered medications for this visit.    Allergies as of 07/14/2021 - Review Complete 07/14/2021  Allergen Reaction Noted   Cymbalta [duloxetine hcl] Other (See Comments) 08/24/2020   Tylenol [acetaminophen] Other (See Comments) 08/24/2020   Ibuprofen Other (See Comments) 12/10/2019    History reviewed. No pertinent family  history.  Social History   Socioeconomic History   Marital status: Divorced    Spouse name: Not on file   Number of children: Not on file   Years of education: Not on file   Highest education level: Not on file  Occupational History   Not on file  Tobacco Use   Smoking status: Former    Packs/day: 0.50    Years: 30.00    Pack years: 15.00    Types: Cigarettes    Quit date: 01/07/2017    Years since quitting: 4.5   Smokeless tobacco: Never  Vaping Use   Vaping Use: Never used  Substance and Sexual Activity   Alcohol use: Yes    Comment: occ  Drug use: Yes    Types: Marijuana    Comment: occ   Sexual activity: Yes    Birth control/protection: Surgical  Other Topics Concern   Not on file  Social History Narrative   Not on file   Social Determinants of Health   Financial Resource Strain: Not on file  Food Insecurity: Not on file  Transportation Needs: Not on file  Physical Activity: Not on file  Stress: Not on file  Social Connections: Not on file   Review of systems General: negative for malaise, night sweats, fever, chills, weight loss Neck: Negative for lumps, goiter, pain and significant neck swelling Resp: Negative for cough, wheezing, dyspnea at rest CV: Negative for chest pain, leg swelling, palpitations, orthopnea GI: denies melena, hematochezia, vomiting, diarrhea, constipation, dysphagia, odyonophagia, early satiety or unintentional weight loss. +nausea +reflux MSK: Negative for joint pain or swelling, back pain, and muscle pain. Derm: Negative for itching or rash Psych: Denies depression, anxiety, memory loss, confusion. No homicidal or suicidal ideation.  Heme: Negative for prolonged bleeding, bruising easily, and swollen nodes. Endocrine: Negative for cold or heat intolerance, polyuria, polydipsia and goiter. Neuro: negative for tremor, gait imbalance, syncope and seizures. The remainder of the review of systems is noncontributory.  Physical  Exam: BP 108/69 (BP Location: Left Arm, Patient Position: Sitting, Cuff Size: Large)    Pulse 76    Temp 98.4 F (36.9 C) (Oral)    Ht 5' (1.524 m)    Wt 161 lb 6.4 oz (73.2 kg)    BMI 31.52 kg/m  General:   Alert and oriented. No distress noted. Pleasant and cooperative.  Head:  Normocephalic and atraumatic. Eyes:  Conjuctiva clear without scleral icterus. Mouth:  Oral mucosa pink and moist. Good dentition. No lesions. Heart: Normal rate and rhythm, s1 and s2 heart sounds present.  Lungs: Clear lung sounds in all lobes. Respirations equal and unlabored. Abdomen:  +BS, soft, non-tender and non-distended. No rebound or guarding. No HSM or masses noted. Derm: No palmar erythema or jaundice Msk:  Symmetrical without gross deformities. Normal posture. Extremities:  Without edema. Neurologic:  Alert and  oriented x4 Psych:  Alert and cooperative. Normal mood and affect.  Invalid input(s): 6 MONTHS   ASSESSMENT: BRITHNEY BENSEN is a 51 y.o. female presenting today for ongoing GERD and nausea.  Nausea has been present since a few months prior to cholecystectomy in September, initially thought to be related to her gallbladder, however, it has continued since gallbladder removal. Nausea improves with sneezing, this is likely related to the vagus nerve, however, we cannot rule out underlying initial cause of her nausea such as PUD, gastritis, or even H Pylori. We will proceed with EGD for further evaluation. Uncontrolled reflux since being off of her dexilant has also worsened her nausea. I am going to resend Rx for dexilant, if not covered initially, we may be able to do a PA as she has failed other PPIs in the past and dexilant seems to be the only PPI that controls her symptoms. I am also sending carafate to see if this helps with her nausea/ongoing reflux.   Indications, risks and benefits of procedure discussed in detail with patient. Patient verbalized understanding and is in agreement to  proceed with EGD at this time.   PLAN:  Refill of dexilant 60mg  once daily sent (may need PA) 2. Carafate 1g QID 3. Schedule EGD 4. May need to consider other causes of nausea (neurologic/anxiety medications if EGD is unrevealing)  Follow Up: 3 months  Ahmere Hemenway L. Alver Sorrow, MSN, APRN, AGNP-C Adult-Gerontology Nurse Practitioner [facility]

## 2021-07-15 DIAGNOSIS — F1220 Cannabis dependence, uncomplicated: Secondary | ICD-10-CM | POA: Insufficient documentation

## 2021-07-15 DIAGNOSIS — F5105 Insomnia due to other mental disorder: Secondary | ICD-10-CM | POA: Insufficient documentation

## 2021-07-16 ENCOUNTER — Encounter (INDEPENDENT_AMBULATORY_CARE_PROVIDER_SITE_OTHER): Payer: Self-pay

## 2021-07-16 ENCOUNTER — Other Ambulatory Visit (INDEPENDENT_AMBULATORY_CARE_PROVIDER_SITE_OTHER): Payer: Self-pay

## 2021-07-16 DIAGNOSIS — G8929 Other chronic pain: Secondary | ICD-10-CM

## 2021-07-16 DIAGNOSIS — Z01812 Encounter for preprocedural laboratory examination: Secondary | ICD-10-CM

## 2021-07-16 DIAGNOSIS — R110 Nausea: Secondary | ICD-10-CM

## 2021-07-17 ENCOUNTER — Other Ambulatory Visit (INDEPENDENT_AMBULATORY_CARE_PROVIDER_SITE_OTHER): Payer: Self-pay

## 2021-07-21 ENCOUNTER — Other Ambulatory Visit (INDEPENDENT_AMBULATORY_CARE_PROVIDER_SITE_OTHER): Payer: Self-pay

## 2021-07-22 ENCOUNTER — Other Ambulatory Visit: Payer: Self-pay

## 2021-07-22 ENCOUNTER — Other Ambulatory Visit (HOSPITAL_COMMUNITY)
Admission: RE | Admit: 2021-07-22 | Discharge: 2021-07-22 | Disposition: A | Discharge status: Home / Self Care | Payer: Medicaid Other | Source: Ambulatory Visit | Attending: Gastroenterology | Admitting: Gastroenterology

## 2021-07-22 DIAGNOSIS — Z01812 Encounter for preprocedural laboratory examination: Secondary | ICD-10-CM | POA: Diagnosis present

## 2021-07-22 LAB — PREGNANCY, URINE: Preg Test, Ur: NEGATIVE

## 2021-07-24 ENCOUNTER — Other Ambulatory Visit: Payer: Self-pay

## 2021-07-24 ENCOUNTER — Ambulatory Visit (HOSPITAL_COMMUNITY)
Admission: RE | Admit: 2021-07-24 | Discharge: 2021-07-24 | Disposition: A | Discharge status: Home / Self Care | Payer: Medicaid Other | Attending: Gastroenterology | Admitting: Gastroenterology

## 2021-07-24 ENCOUNTER — Encounter (HOSPITAL_COMMUNITY): Payer: Self-pay | Admitting: Gastroenterology

## 2021-07-24 ENCOUNTER — Ambulatory Visit (HOSPITAL_COMMUNITY): Payer: Medicaid Other | Admitting: Anesthesiology

## 2021-07-24 ENCOUNTER — Encounter (HOSPITAL_COMMUNITY)
Admission: RE | Disposition: A | Discharge status: Home / Self Care | Payer: Self-pay | Source: Home / Self Care | Attending: Gastroenterology

## 2021-07-24 DIAGNOSIS — K209 Esophagitis, unspecified without bleeding: Secondary | ICD-10-CM | POA: Diagnosis not present

## 2021-07-24 DIAGNOSIS — Z87891 Personal history of nicotine dependence: Secondary | ICD-10-CM | POA: Insufficient documentation

## 2021-07-24 DIAGNOSIS — Z9049 Acquired absence of other specified parts of digestive tract: Secondary | ICD-10-CM | POA: Diagnosis not present

## 2021-07-24 DIAGNOSIS — K2100 Gastro-esophageal reflux disease with esophagitis, without bleeding: Secondary | ICD-10-CM | POA: Insufficient documentation

## 2021-07-24 DIAGNOSIS — R140 Abdominal distension (gaseous): Secondary | ICD-10-CM | POA: Insufficient documentation

## 2021-07-24 DIAGNOSIS — R110 Nausea: Secondary | ICD-10-CM

## 2021-07-24 DIAGNOSIS — I10 Essential (primary) hypertension: Secondary | ICD-10-CM | POA: Diagnosis not present

## 2021-07-24 DIAGNOSIS — G8929 Other chronic pain: Secondary | ICD-10-CM

## 2021-07-24 DIAGNOSIS — E119 Type 2 diabetes mellitus without complications: Secondary | ICD-10-CM | POA: Diagnosis not present

## 2021-07-24 HISTORY — PX: ESOPHAGOGASTRODUODENOSCOPY (EGD) WITH PROPOFOL: SHX5813

## 2021-07-24 HISTORY — PX: BIOPSY: SHX5522

## 2021-07-24 LAB — GLUCOSE, CAPILLARY: Glucose-Capillary: 107 mg/dL — ABNORMAL HIGH (ref 70–99)

## 2021-07-24 SURGERY — ESOPHAGOGASTRODUODENOSCOPY (EGD) WITH PROPOFOL
Anesthesia: General

## 2021-07-24 MED ORDER — PROPOFOL 500 MG/50ML IV EMUL
INTRAVENOUS | Status: DC | PRN
  Administered 2021-07-24: 150 ug/kg/min via INTRAVENOUS

## 2021-07-24 MED ORDER — LACTATED RINGERS IV SOLN: INTRAVENOUS | Status: DC

## 2021-07-24 MED ORDER — PROPOFOL 1000 MG/100ML IV EMUL
INTRAVENOUS | Status: AC
  Filled 2021-07-24: qty 100

## 2021-07-24 NOTE — Anesthesia Postprocedure Evaluation (Signed)
Anesthesia Post Note  Patient: Destiny Beck  Procedure(s) Performed: ESOPHAGOGASTRODUODENOSCOPY (EGD) WITH PROPOFOL BIOPSY  Patient location during evaluation: Phase II Anesthesia Type: General Level of consciousness: awake Pain management: pain level controlled Vital Signs Assessment: post-procedure vital signs reviewed and stable Respiratory status: spontaneous breathing and respiratory function stable Cardiovascular status: blood pressure returned to baseline and stable Postop Assessment: no headache and no apparent nausea or vomiting Anesthetic complications: no Comments: Late entry   No notable events documented.   Last Vitals:  Vitals:   07/24/21 0758 07/24/21 0800  BP: (!) 74/63 107/80  Pulse: 73   Resp: (!) 21   Temp: (!) 36.4 C   SpO2: 94% 94%    Last Pain:  Vitals:   07/24/21 0800  TempSrc:   PainSc: 0-No pain                 Louann Sjogren

## 2021-07-24 NOTE — Interval H&P Note (Signed)
History and Physical Interval Note:  07/24/2021 7:34 AM  Destiny Beck  has presented today for surgery, with the diagnosis of Nausea & Epigastric Pain.  The various methods of treatment have been discussed with the patient and family. After consideration of risks, benefits and other options for treatment, the patient has consented to  Procedure(s) with comments: ESOPHAGOGASTRODUODENOSCOPY (EGD) WITH PROPOFOL (N/A) - 730 as a surgical intervention.  The patient's history has been reviewed, patient examined, no change in status, stable for surgery.  I have reviewed the patient's chart and labs.  Questions were answered to the patient's satisfaction.     Destiny Beck

## 2021-07-24 NOTE — Discharge Instructions (Addendum)
You are being discharged to home.  Resume your previous diet.  We are waiting for your pathology results.  Continue Dexilant 60 mg qday.

## 2021-07-24 NOTE — Op Note (Addendum)
[facility] Patient Name: Destiny Beck Procedure Date: 07/24/2021 7:05 AM MRN: [id number] Date of Birth: [birth year] Attending MD: [name] ,  CSN: [id number] Age: 51 Admit Type: Outpatient Procedure:                Upper GI endoscopy Indications:              Nausea Providers:                [name], Lambert Mody Aram Candela Referring MD:              Medicines:                Monitored Anesthesia Care Complications:            No immediate complications. Estimated Blood Loss:     Estimated blood loss: none. Procedure:                Pre-Anesthesia Assessment:                           - Prior to the procedure, a History and Physical                            was performed, and patient medications, allergies                            and sensitivities were reviewed. The patient's                            tolerance of previous anesthesia was reviewed.                           - The risks and benefits of the procedure and the                            sedation options and risks were discussed with the                            patient. All questions were answered and informed                            consent was obtained.                           - ASA Grade Assessment: II - A patient with mild                            systemic disease.                           After obtaining informed consent, the endoscope was                            passed under direct vision. Throughout the                            procedure, the patient's blood pressure, pulse, and  oxygen saturations were monitored continuously. The                            [id number]-H190 ([id number]) scope was introduced through the                            mouth, and advanced to the second part of duodenum.                            The upper GI endoscopy was accomplished without                            difficulty. The patient tolerated the procedure                             well. Scope In: 7:46:33 AM Scope Out: 7:52:09 AM Total Procedure Duration: 0 hours 5 minutes 36 seconds  Findings:      LA Grade A (one or more mucosal breaks less than 5 mm, not extending       between tops of 2 mucosal folds) esophagitis with no bleeding was found       at the gastroesophageal junction.      The entire examined stomach was normal. Biopsies were taken with a cold       forceps for Helicobacter pylori testing.      The examined duodenum was normal. Biopsies were taken with a cold       forceps for histology. Impression:               - LA Grade A esophagitis with no bleeding.                           - Normal stomach. Biopsied.                           - Normal examined duodenum. Biopsied. Moderate Sedation:      Per Anesthesia Care Recommendation:           - Discharge patient to home (ambulatory).                           - Resume previous diet.                           - Await pathology results.                           - Continue Dexilant 60 mg qday.                           - Stop Reglan (she is already taking promethazine)                           - Discuss with diabetes doctor if possible to stop                            Victoza - this may be causing persisting nausea. Procedure Code(s):        ---  Professional ---                           [phone], Esophagogastroduodenoscopy, flexible,                            transoral; with biopsy, single or multiple Diagnosis Code(s):        --- Professional ---                           K20.90, Esophagitis, unspecified without bleeding                           R11.0, Nausea CPT copyright 2019 [employer]. All rights reserved. The codes documented in this report are preliminary and upon coder review may  be revised to meet current compliance requirements. [name], MD [name],  07/24/2021 8:00:20 AM This report has been signed electronically. Number of  Addenda: 0

## 2021-07-24 NOTE — Transfer of Care (Signed)
Immediate Anesthesia Transfer of Care Note  Patient: Destiny Beck  Procedure(s) Performed: ESOPHAGOGASTRODUODENOSCOPY (EGD) WITH PROPOFOL BIOPSY  Patient Location: PACU  Anesthesia Type:General  Level of Consciousness: awake, alert , oriented and patient cooperative  Airway & Oxygen Therapy: Patient Spontanous Breathing  Post-op Assessment: Report given to RN, Post -op Vital signs reviewed and stable and Patient moving all extremities X 4  Post vital signs: Reviewed and stable  Last Vitals:  Vitals Value Taken Time  BP 107/80 07/24/21 0800  Temp 36.4 C 07/24/21 0758  Pulse 73 07/24/21 0758  Resp 21 07/24/21 0758  SpO2 94 % 07/24/21 0800    Last Pain:  Vitals:   07/24/21 0800  TempSrc:   PainSc: 0-No pain      Patients Stated Pain Goal: 5 (16/10/96 0454)  Complications: No notable events documented.

## 2021-07-24 NOTE — Progress Notes (Signed)
Patient sitting upright, tolerated crackers and gingerale without difficulty. Patient waiting to talk to Dr. Jenetta Downer postoperatively.

## 2021-07-24 NOTE — Anesthesia Preprocedure Evaluation (Signed)
Anesthesia Evaluation  Patient identified by MRN, date of birth, ID band Patient awake    Reviewed: Allergy & Precautions, H&P , NPO status , Patient's Chart, lab work & pertinent test results, reviewed documented beta blocker date and time   Airway Mallampati: II  TM Distance: >3 FB Neck ROM: full    Dental no notable dental hx.    Pulmonary neg pulmonary ROS, former smoker,    Pulmonary exam normal breath sounds clear to auscultation       Cardiovascular Exercise Tolerance: Good hypertension, negative cardio ROS   Rhythm:regular Rate:Normal     Neuro/Psych  Headaches, PSYCHIATRIC DISORDERS Anxiety Depression  Neuromuscular disease    GI/Hepatic Neg liver ROS, GERD  Medicated,  Endo/Other  negative endocrine ROSdiabetes  Renal/GU negative Renal ROS  negative genitourinary   Musculoskeletal   Abdominal   Peds  Hematology  (+) Blood dyscrasia, anemia ,   Anesthesia Other Findings   Reproductive/Obstetrics negative OB ROS                             Anesthesia Physical Anesthesia Plan  ASA: 2  Anesthesia Plan: General   Post-op Pain Management:    Induction:   PONV Risk Score and Plan: Propofol infusion  Airway Management Planned:   Additional Equipment:   Intra-op Plan:   Post-operative Plan:   Informed Consent: I have reviewed the patients History and Physical, chart, labs and discussed the procedure including the risks, benefits and alternatives for the proposed anesthesia with the patient or authorized representative who has indicated his/her understanding and acceptance.     Dental Advisory Given  Plan Discussed with: CRNA  Anesthesia Plan Comments:         Anesthesia Quick Evaluation

## 2021-07-28 LAB — SURGICAL PATHOLOGY

## 2021-07-29 ENCOUNTER — Encounter (HOSPITAL_COMMUNITY): Payer: Self-pay | Admitting: Gastroenterology

## 2021-09-09 DIAGNOSIS — F3181 Bipolar II disorder: Secondary | ICD-10-CM | POA: Insufficient documentation

## 2021-09-21 ENCOUNTER — Ambulatory Visit: Payer: Medicaid Other | Attending: Nurse Practitioner

## 2021-09-21 ENCOUNTER — Other Ambulatory Visit: Payer: Self-pay

## 2021-09-21 DIAGNOSIS — G8929 Other chronic pain: Secondary | ICD-10-CM | POA: Insufficient documentation

## 2021-09-21 DIAGNOSIS — M545 Low back pain, unspecified: Secondary | ICD-10-CM | POA: Insufficient documentation

## 2021-09-21 DIAGNOSIS — M542 Cervicalgia: Secondary | ICD-10-CM | POA: Diagnosis not present

## 2021-09-21 NOTE — Therapy (Addendum)
[facility], [place], [address] Phone: [phone]   Fax:  [phone]  Physical Therapy Evaluation  Patient Details  Name: Destiny Beck MRN: [id number] Date of Birth: 03-Mar-1971 Referring Provider (PT): Ronnette Hila, NP   Encounter Date: 09/21/2021   PT End of Session - 09/21/21 0814     Visit Number 1    Number of Visits 12    Date for PT Re-Evaluation 12/04/21    PT Start Time 0817    PT Stop Time 0859    PT Time Calculation (min) 42 min    Activity Tolerance Patient tolerated treatment well    Behavior During Therapy [facility] for tasks assessed/performed             Past Medical History:  Diagnosis Date   Abscess    Anemia    Anxiety    Arthritis    Chronic back pain    Chronic fatigue    Chronic neck pain    Depression    Diabetes mellitus without complication ([place])    Dizziness    Fibromyalgia    GERD (gastroesophageal reflux disease)    Headache    Hypertension    Occipital neuralgia    Sciatica     Past Surgical History:  Procedure Laterality Date   BIOPSY  08/04/2018   Procedure: BIOPSY;  Surgeon: Rogene Houston, MD;  [facility];  Service: Endoscopy;;  gastric    BIOPSY  07/24/2021   Procedure: BIOPSY;  Surgeon: Harvel Quale, MD;  [facility];  Service: Gastroenterology;;   CESAREAN SECTION     x2   CHOLECYSTECTOMY N/A 03/20/2021   Procedure: LAPAROSCOPIC CHOLECYSTECTOMY;  Surgeon: Virl Cagey, MD;  Location: [facility];  Service: General;  Laterality: N/A;   COLONOSCOPY WITH PROPOFOL N/A 08/04/2018   normal, external hemorrhoids   DILATION AND CURETTAGE OF UTERUS     with ablation   ESOPHAGOGASTRODUODENOSCOPY (EGD) WITH PROPOFOL N/A 08/04/2018   Normal proximal esophagus, mid esophagus and distal esophagus. 2cm hh, scar in prepyloric region of stomach, gastritis, normal duodenal bulb and second portion of duodenum    ESOPHAGOGASTRODUODENOSCOPY (EGD) WITH PROPOFOL N/A 07/24/2021   Procedure: ESOPHAGOGASTRODUODENOSCOPY (EGD) WITH PROPOFOL;  Surgeon: Harvel Quale, MD;  [facility];  Service: Gastroenterology;  Laterality: N/A;  730   TUBAL LIGATION      There were no vitals filed for this visit.    Subjective Assessment - 09/21/21 0817     Subjective Patient reports that she her shoulders and legs are stiff today from going to the gym on Saturday morning. She notes that her neck and back have been bothering her for over 20 years. She notes that her pain has been steadily getting worse over the years. She was told that her pain is coming from arthritis, but she thinks that her pain is coming from something else. She notes that she is not as active as she was while she was working. She feels that she has always had bad posture. She has tingling in both hands, but she has also been diagnosed with carpal tunnel. She had an injection in her back about 2 weeks ago, but this did not seem to help any.    Pertinent History anxiety, carpal tunnel, history of vertigo    Limitations Walking;Standing;House hold activities    How long can you stand comfortably? 10-20 minutes    How long can you walk comfortably? "might be  able to do 1/2 mile"    Patient Stated Goals reduced pain, improved mobility,    Currently in Pain? Yes    Pain Score 8     Pain Location Back    Pain Orientation Upper;Mid;Lower    Pain Descriptors / Indicators Tightness;Burning;Aching;Tender    Pain Type Chronic pain    Pain Onset More than a month ago    Pain Frequency Constant    Aggravating Factors  laundry, cleaning, housework, and pain is worse first thing in the morning    Pain Relieving Factors medication, heat, ice, and massages    Effect of Pain on Daily Activities She is unable to wash a full load of dishes due to the pain, avoids any steps or stairs                [facility] PT Assessment - 09/21/21 0001        Assessment   Medical Diagnosis Facet arthropathy, lumbar and cervical    Referring Provider (PT) Ronnette Hila, NP    Onset Date/Surgical Date --   over 20 years ago   Next MD Visit about 3 months    Prior Therapy Yes, about 15-20 years ago      Precautions   Precautions None      Restrictions   Weight Bearing Restrictions No      Balance Screen   Has the patient fallen in the past 6 months Yes    How many times? 1   fell in her shower   Has the patient had a decrease in activity level because of a fear of falling?  Yes    Is the patient reluctant to leave their home because of a fear of falling?  Yes      [place] residence    Type of [place] Access Level entry    Home Layout One level      Prior Function   Level of Independence Independent    Vocation Retired    Leisure Forensic psychologist, working out, walking      Cognition   Overall Cognitive Status Within [employer] for tasks assessed    Attention Focused    Focused Attention Appears intact    Memory Appears intact    Awareness Appears intact    Problem Solving Appears intact      Sensation   Additional Comments Patient reports intermittent tingling in her hands, but non currently      Posture/Postural Control   Posture/Postural Control Postural limitations    Postural Limitations Rounded Shoulders;Forward head;Increased lumbar lordosis      ROM / Strength   AROM / PROM / Strength AROM;Strength      AROM   AROM Assessment Site Cervical;Lumbar    Cervical Flexion 34   pain and pulling   Cervical Extension 30   pain and pulling   Cervical - Right Side Bend 10   familiar pain and catch   Cervical - Left Side Bend 30   familiar pain and catch   Cervical - Right Rotation 40   slow and pulling (right rotation > left)   Cervical - Left Rotation 51   slow and pulling   Lumbar Flexion 15   left hip pain   Lumbar Extension 10   left hip pain   Lumbar - Right  Side Bend 25% limited    Lumbar - Left Side Bend 25% limited    Lumbar -  Right Rotation 25% limited   painful   Lumbar - Left Rotation 25% limited   pain (pain with left rotation > right rotation)     Strength   Strength Assessment Site Shoulder;Hip;Knee;Ankle;Elbow    Right/Left Shoulder Right;Left    Right Shoulder Flexion 4-/5   sore   Right Shoulder ABduction 4/5    Left Shoulder Flexion 4-/5   sore   Left Shoulder ABduction 4/5    Right/Left Elbow Right;Left    Right Elbow Flexion 4-/5   sore   Right Elbow Extension 4/5    Left Elbow Flexion 4-/5   sore   Left Elbow Extension 4/5    Right/Left Hip Right;Left    Right Hip Flexion 4-/5   sore   Left Hip Flexion 4-/5   sore   Right/Left Knee Right;Left    Right Knee Flexion 4-/5    Right Knee Extension 4/5    Left Knee Flexion 4-/5    Left Knee Extension 4-/5    Right/Left Ankle Right;Left    Right Ankle Dorsiflexion 4-/5    Left Ankle Dorsiflexion 4-/5      Palpation   Spinal mobility Cervical and lumbar: unable to accurately assess due to muscle guarding and pain    Palpation comment TTP: left QL and lumbar paraspinals (refer pain into left hip), bilateral gluteals, piriformis, hamstrings, latissimus dorsi, scapular retractors, upper trapezius, cervical paraspinals      Special Tests    Special Tests Cervical    Cervical Tests other      other    Findings Negative    Comment Sharp-Purser and alar ligament testing      Bed Mobility   Bed Mobility Rolling Right;Right Sidelying to Sit    Rolling Right Independent   increased time (> 6 seconds)   Right Sidelying to Sit Independent   increased time (6 seconds)                       Objective measurements completed on examination: See above findings.                     PT Long Term Goals - 09/21/21 2633       PT LONG TERM GOAL #1   Title Patient will be independent with her HEP.    Time 6    Period Weeks    Status New    Target  Date 11/02/21      PT LONG TERM GOAL #2   Title Patient will be able to demonstrate at least 60 degrees of cervical rotation bilaterally for improved safety driving.    Time 6    Period Weeks    Status New    Target Date 11/02/21      PT LONG TERM GOAL #3   Title Patient will be able to demonstrate at least 30 degrees of active lumbar flexion for improved function picking items up from the floor.    Time 6    Period Weeks    Status New    Target Date 11/02/21      PT LONG TERM GOAL #4   Title Patient will be able to stand at least 30 minutes without being limited by her familiar symptoms for improved function washing her dishes.    Time 6    Period Weeks    Status New    Target Date 11/02/21  Plan - 09/21/21 1249     Clinical Impression Statement Patient is a 51 year old female presenting to physical therapy with chronic cervical and lumbar spine pain. She presented with high symptom severity and irritability. She exhibited increased soreness and discomfort with cervical and lumbar AROM. However, palpation and joint mobility assessments were the most aggravating to her familiar symptoms. Recommend that she continue with skilled physical therapy to address her remaining impairments to maximize her functional mobility.    Personal Factors and Comorbidities Time since onset of injury/illness/exacerbation;Other    Examination-Activity Limitations Locomotion Level;Bed Mobility;Bend;Sit;Carry;Stairs;Stand;Lift    Examination-Participation Restrictions Cleaning;Community Activity;Driving;Shop;Laundry    Stability/Clinical Decision Making Evolving/Moderate complexity    Clinical Decision Making Moderate    Rehab Potential Fair    PT Frequency 2x / week    PT Duration 6 weeks    PT Treatment/Interventions ADLs/Self Care Home Management;Cryotherapy;Electrical Stimulation;Traction;Moist Heat;Functional mobility training;Therapeutic activities;Therapeutic  exercise;Balance training;Neuromuscular re-education;Manual techniques;Patient/family education;Dry needling;Taping    PT Next Visit Plan nustep, lower trunk rotations, light strengthening, and modalities as needed    Consulted and Agree with Plan of Care Patient             Patient will benefit from skilled therapeutic intervention in order to improve the following deficits and impairments:  Decreased range of motion, Difficulty walking, Impaired tone, Decreased activity tolerance, Pain, Decreased balance, Hypomobility, Impaired flexibility, Postural dysfunction, Decreased strength, Decreased mobility  Visit Diagnosis: Cervicalgia  Chronic bilateral low back pain without sciatica     Problem List Patient Active Problem List   Diagnosis Date Noted   Gastroesophageal reflux disease 07/14/2021   Nausea without vomiting 07/14/2021   Cholelithiasis 03/18/2021   Constipation 11/06/2018   Bloating 11/06/2018   Abdominal distention 11/06/2018   Heme positive stool 07/26/2018   Absolute anemia 07/26/2018   PHYSICAL THERAPY DISCHARGE SUMMARY  Visits from Start of Care: 1  Current functional level related to goals / functional outcomes: Patient is being discharged at this time as she has not returned to physical therapy since her evaluation.    Remaining deficits: See evaluation   Education / Equipment: Educated on symptoms   Patient agrees to discharge. Patient goals were not met. Patient is being discharged due to not returning since the last visit.   Darlin Coco, PT 09/21/2021, 3:22 PM  [facility] [address] [place], [place], [address] Phone: [phone]   Fax:  [phone]  Name: Destiny Beck MRN: [id number] Date of Birth: [birth year]

## 2021-09-25 ENCOUNTER — Ambulatory Visit: Payer: Medicaid Other

## 2021-10-12 ENCOUNTER — Ambulatory Visit (INDEPENDENT_AMBULATORY_CARE_PROVIDER_SITE_OTHER): Payer: Medicaid Other | Admitting: Gastroenterology

## 2021-10-12 ENCOUNTER — Encounter (INDEPENDENT_AMBULATORY_CARE_PROVIDER_SITE_OTHER): Payer: Self-pay | Admitting: Gastroenterology

## 2021-10-19 ENCOUNTER — Telehealth (INDEPENDENT_AMBULATORY_CARE_PROVIDER_SITE_OTHER): Payer: Self-pay

## 2021-10-19 NOTE — Telephone Encounter (Signed)
I called the patient to let her know that the Dexilant is not covered by her insurance.  However, she is not pick up the phone, I left a detailed voice message.  She has failed multiple other PPIs and may only be responsive to [place] unfortunately..  She then tried buying out-of-pocket, can try other PPI but I will need to discuss this with her further.  Other options may include Nissen fundoplication versus TIF. ?

## 2021-10-19 NOTE — Telephone Encounter (Signed)
Dexilant not covered but for three days with patient insurance. Please advise ?

## 2021-10-19 NOTE — Telephone Encounter (Signed)
Patient aware to be here on 10/22/2021 at 3 pm to discuss other PPI or Reflux surgeries. ?

## 2021-10-22 ENCOUNTER — Encounter (INDEPENDENT_AMBULATORY_CARE_PROVIDER_SITE_OTHER): Payer: Self-pay | Admitting: Gastroenterology

## 2021-10-22 ENCOUNTER — Ambulatory Visit (INDEPENDENT_AMBULATORY_CARE_PROVIDER_SITE_OTHER): Payer: Medicaid Other | Admitting: Gastroenterology

## 2021-10-22 VITALS — BP 106/72 | HR 91 | Temp 98.1°F | Ht 60.0 in | Wt 156.5 lb

## 2021-10-22 DIAGNOSIS — K219 Gastro-esophageal reflux disease without esophagitis: Secondary | ICD-10-CM

## 2021-10-22 DIAGNOSIS — R110 Nausea: Secondary | ICD-10-CM | POA: Diagnosis not present

## 2021-10-22 MED ORDER — DOXYLAMINE-PYRIDOXINE 10-10 MG PO TBEC: 1.0000 | DELAYED_RELEASE_TABLET | Freq: Three times a day (TID) | ORAL | 2 refills | Status: DC | PRN

## 2021-10-22 MED ORDER — PANTOPRAZOLE SODIUM 40 MG PO TBEC: 40.0000 mg | DELAYED_RELEASE_TABLET | Freq: Every day | ORAL | 3 refills | Status: DC

## 2021-10-22 NOTE — Patient Instructions (Addendum)
Will switch to pantoprazole 40 mg qday ?Start pyridoxine-doxylamine 10-10 mg every 8 h as needed - will need to avoid Meclizine when taking this medication ?Take Phenergan as needed ?

## 2021-10-22 NOTE — Progress Notes (Signed)
[name], M.D. ?Gastroenterology & Hepatology ?[facility] ?[address] ?[place], [place] [address] ? ?Primary Care Physician: ?Bridget Hartshorn, NP ?[address] ?[place] [address] ? ?I will communicate my assessment and recommendations to the referring MD via EMR. ? ?Problems: ?Chronic nausea ?GERD ?Grade a esophagitis ? ?History of Present Illness: ?Destiny Beck is a 51 y.o. female with past medical history of anemia, anxiety, arthritis, fatigue, back pain, depression, DM, fibromyalgia, GERD, HTN, occipital neuralgia, who came to the clinic for follow-up of GERD and nausea. ? ?The patient was last seen on 07/14/2021. At that time, the patient was given a refill of Dexilant. Patient underwent an EGD which showed: ?LA Grade A (one or more mucosal breaks less than 5 mm, not extending between tops of 2 mucosal folds) esophagitis with no bleeding was found at the gastroesophageal junction. ?The entire examined stomach was normal. Biopsies were taken with a cold forceps for Helicobacter pylori testing. Path: Reactive gastropathy with mild chronic gastritis. Negative for H. pylori, intestinal metaplasia, dysplasia and carcinoma  ?The examined duodenum was normal. Biopsies were taken with a cold forceps for histology. Path: Benign duodenal mucosa with no diagnostic abnormality  ? ?Patient reports that she had had persistent and recurrent episodes of nausea that improves with sneezing. She feels nasal congestion prior to sneezing. She has these episodes multiple times a day. Has had these episodes for several months and is very concerned as this has not improved with time. She has had decreased appetite. She has been taking Phenergan  twice a day, which she states somewhat helps. Does not remember taking the Zofran. She states that she has felt most of the nausea improvement with Ginger Ale. Last week she had some episodes of vomiting  but not on a frequent basis. ? ?Notably, as part of the evaluation of her nausea she underwent a CT of the abdomen pelvis with IV contrast on 03/06/2021 when she was found to have cholelithiasis.  Due to these, she underwent hysterectomy but she did not have any improvement of her nausea. ? ?Patient reports that she has tried omeprazole, Prilosec, Nexium. Was on dexilant but Medicaid did not cover this anymore until . Has heartburn 1-2 times a week. ? ?The patient denies having any fever, chills, hematochezia, melena, hematemesis, abdominal distention, abdominal pain, diarrhea, jaundice, pruritus or weight loss. ? ?Last EGD: as above ?Last Colonoscopy: 08/07/2018 - normal, external hemorrhoids ?Advised to repeat in 5 years ? ?Past Medical History: ?Past Medical History:  ?Diagnosis Date  ? Abscess   ? Anemia   ? Anxiety   ? Arthritis   ? Chronic back pain   ? Chronic fatigue   ? Chronic neck pain   ? Depression   ? Diabetes mellitus without complication ([place])   ? Dizziness   ? Fibromyalgia   ? GERD (gastroesophageal reflux disease)   ? Headache   ? Hypertension   ? Occipital neuralgia   ? Sciatica   ? ? ?Past Surgical History: ?Past Surgical History:  ?Procedure Laterality Date  ? BIOPSY  08/04/2018  ? Procedure: BIOPSY;  Surgeon: Rogene Houston, MD;  [facility];  Service: Endoscopy;;  gastric ?  ? BIOPSY  07/24/2021  ? Procedure: BIOPSY;  Surgeon: Montez Morita, Quillian Quince, MD;  [facility];  Service: Gastroenterology;;  ? CESAREAN SECTION    ? x2  ? CHOLECYSTECTOMY N/A 03/20/2021  ? Procedure: LAPAROSCOPIC CHOLECYSTECTOMY;  Surgeon: Virl Cagey, MD;  Location: [facility];  Service: General;  Laterality: N/A;  ? COLONOSCOPY WITH PROPOFOL N/A 08/04/2018  ? normal, external hemorrhoids  ? DILATION AND CURETTAGE OF UTERUS    ? with ablation  ? ESOPHAGOGASTRODUODENOSCOPY (EGD) WITH PROPOFOL N/A 08/04/2018  ? Normal proximal esophagus, mid esophagus and distal esophagus. 2cm hh, scar in  prepyloric region of stomach, gastritis, normal duodenal bulb and second portion of duodenum  ? ESOPHAGOGASTRODUODENOSCOPY (EGD) WITH PROPOFOL N/A 07/24/2021  ? Procedure: ESOPHAGOGASTRODUODENOSCOPY (EGD) WITH PROPOFOL;  Surgeon: Harvel Quale, MD;  [facility];  Service: Gastroenterology;  Laterality: N/A;  730  ? TUBAL LIGATION    ? ? ?Family History:History reviewed. No pertinent family history. ? ?Social History: ?Social History  ? ?Tobacco Use  ?Smoking Status Former  ? Packs/day: 0.50  ? Years: 30.00  ? Pack years: 15.00  ? Types: Cigarettes  ? Quit date: 01/07/2017  ? Years since quitting: 4.7  ?Smokeless Tobacco Never  ? ?Social History  ? ?Substance and Sexual Activity  ?Alcohol Use Yes  ? Comment: occ  ? ?Social History  ? ?Substance and Sexual Activity  ?Drug Use Yes  ? Types: Marijuana  ? Comment: occ  ? ? ?Allergies: ?Allergies  ?Allergen Reactions  ? Cymbalta [Duloxetine Hcl] Other (See Comments)  ?  Stroke like symptoms ?  ? Tylenol [Acetaminophen] Other (See Comments)  ?  Due to complications with Liver ?  ? Ibuprofen Other (See Comments)  ?  Due to anemia  ? ? ?Medications: ?Current Outpatient Medications  ?Medication Sig Dispense Refill  ? albuterol (VENTOLIN HFA) 108 (90 Base) MCG/ACT inhaler Inhale 1-2 puffs into the lungs every 6 (six) hours as needed for wheezing or shortness of breath.     ? buPROPion (WELLBUTRIN XL) 300 MG 24 hr tablet Take 300 mg by mouth in the morning.    ? COMBIPATCH 0.05-0.14 MG/DAY Place 1 patch onto the skin 2 (two) times a week. Changes every 3 days (apply to alternating sides)    ? gabapentin (NEURONTIN) 600 MG tablet Take 600 mg by mouth 3 (three) times daily.     ? hydrOXYzine (ATARAX) 50 MG tablet Take 50 mg by mouth 3 (three) times daily.    ? metFORMIN (GLUCOPHAGE) 1000 MG tablet Take 500 mg by mouth 2 (two) times daily.    ? olopatadine (PATANOL) 0.1 % ophthalmic solution Place 1 drop into both eyes 2 (two) times daily as needed for  allergies.    ? Omega-3 Fatty Acids (FISH OIL) 1200 MG CAPS Take 1,200 mg by mouth in the morning and at bedtime.    ? prazosin (MINIPRESS) 2 MG capsule Take 2 mg by mouth at bedtime.    ? rizatriptan (MAXALT) 10 MG tablet Take 10 mg by mouth every 2 (two) hours as needed for migraine (max 2 tablets/24hrs.).    ? sucralfate (CARAFATE) 1 GM/10ML suspension Take 10 mLs (1 g total) by mouth 4 (four) times daily -  with meals and at bedtime. 420 mL 3  ? dexlansoprazole (DEXILANT) 60 MG capsule Take 1 capsule (60 mg total) by mouth daily. (Patient not taking: Reported on 10/22/2021) 90 capsule 3  ? meclizine (ANTIVERT) 25 MG tablet Take 1-2 tablets (25-50 mg total) by mouth 3 (three) times daily as needed for dizziness. (Patient not taking: Reported on 10/22/2021) 30 tablet 0  ? Multiple Vitamin (MULTIVITAMIN WITH MINERALS) TABS tablet Take 1 tablet by mouth 3 (three) times a week. (  Patient not taking: Reported on 10/22/2021)    ? ondansetron (ZOFRAN ODT) 4 MG disintegrating tablet Take 1 tablet (4 mg total) by mouth every 8 (eight) hours as needed for nausea or vomiting. (Patient not taking: Reported on 07/17/2021) 20 tablet 0  ? promethazine (PHENERGAN) 12.5 MG tablet Take 1 tablet (12.5 mg total) by mouth every 6 (six) hours as needed for refractory nausea / vomiting. (Patient not taking: Reported on 10/22/2021) 30 tablet 0  ? traZODone (DESYREL) 100 MG tablet Take 50-100 mg by mouth at bedtime. (Patient not taking: Reported on 09/21/2021)    ? venlafaxine XR (EFFEXOR-XR) 150 MG 24 hr capsule Take 150 mg by mouth in the morning.    ? ?No current facility-administered medications for this visit.  ? ? ?Review of Systems: ?GENERAL: negative for malaise, night sweats ?HEENT: No changes in hearing or vision, no nose bleeds or other nasal problems. ?NECK: Negative for lumps, goiter, pain and significant neck swelling ?RESPIRATORY: Negative for cough, wheezing ?CARDIOVASCULAR: Negative for chest pain, leg swelling, palpitations,  orthopnea ?GI: SEE HPI ?MUSCULOSKELETAL: Negative for joint pain or swelling, back pain, and muscle pain. ?SKIN: Negative for lesions, rash ?PSYCH: Negative for sleep disturbance, mood disorder and recent psychosoci

## 2021-11-11 ENCOUNTER — Telehealth (INDEPENDENT_AMBULATORY_CARE_PROVIDER_SITE_OTHER): Payer: Self-pay | Admitting: *Deleted

## 2021-11-11 NOTE — Telephone Encounter (Signed)
Left message to return call - received PA for dexlansoprazole '60mg'$  and submitted through cover my meds. It was denied on 10/14/21 and 10/21/21. Still not going through when I resubmitted it.  ?

## 2021-11-13 NOTE — Telephone Encounter (Signed)
Left message to return call 

## 2021-11-23 ENCOUNTER — Encounter (HOSPITAL_COMMUNITY): Payer: Self-pay

## 2021-11-23 ENCOUNTER — Emergency Department (HOSPITAL_COMMUNITY)
Admission: EM | Admit: 2021-11-23 | Discharge: 2021-11-23 | Disposition: A | Discharge status: Home / Self Care | Payer: Medicaid Other | Attending: Emergency Medicine | Admitting: Emergency Medicine

## 2021-11-23 ENCOUNTER — Other Ambulatory Visit: Payer: Self-pay

## 2021-11-23 DIAGNOSIS — R42 Dizziness and giddiness: Secondary | ICD-10-CM

## 2021-11-23 DIAGNOSIS — R519 Headache, unspecified: Secondary | ICD-10-CM | POA: Insufficient documentation

## 2021-11-23 DIAGNOSIS — Z7984 Long term (current) use of oral hypoglycemic drugs: Secondary | ICD-10-CM | POA: Insufficient documentation

## 2021-11-23 DIAGNOSIS — R110 Nausea: Secondary | ICD-10-CM | POA: Insufficient documentation

## 2021-11-23 LAB — BASIC METABOLIC PANEL
Anion gap: 9 (ref 5–15)
BUN: 22 mg/dL — ABNORMAL HIGH (ref 6–20)
CO2: 24 mmol/L (ref 22–32)
Calcium: 9.5 mg/dL (ref 8.9–10.3)
Chloride: 105 mmol/L (ref 98–111)
Creatinine, Ser: 0.8 mg/dL (ref 0.44–1.00)
GFR, Estimated: >60 mL/min (ref >60)
Glucose, Bld: 141 mg/dL — ABNORMAL HIGH (ref 70–99)
Potassium: 4.1 mmol/L (ref 3.5–5.1)
Sodium: 138 mmol/L (ref 135–145)

## 2021-11-23 LAB — CBC
HCT: 42.6 % (ref 36.0–46.0)
Hemoglobin: 14.1 g/dL (ref 12.0–15.0)
MCH: 30 pg (ref 26.0–34.0)
MCHC: 33.1 g/dL (ref 30.0–36.0)
MCV: 90.6 fL (ref 80.0–100.0)
Platelets: 271 10*3/uL (ref 150–400)
RBC: 4.7 MIL/uL (ref 3.87–5.11)
RDW: 13.9 % (ref 11.5–15.5)
WBC: 7.4 10*3/uL (ref 4.0–10.5)
nRBC: 0 % (ref 0.0–0.2)

## 2021-11-23 LAB — URINALYSIS, ROUTINE W REFLEX MICROSCOPIC
Bilirubin Urine: NEGATIVE
Glucose, UA: NEGATIVE mg/dL
Hgb urine dipstick: NEGATIVE
Ketones, ur: NEGATIVE mg/dL
Leukocytes,Ua: NEGATIVE
Nitrite: NEGATIVE
Protein, ur: NEGATIVE mg/dL
Specific Gravity, Urine: 1.005 (ref 1.005–1.030)
pH: 6 (ref 5.0–8.0)

## 2021-11-23 LAB — PREGNANCY, URINE: Preg Test, Ur: NEGATIVE

## 2021-11-23 MED ORDER — MECLIZINE HCL 12.5 MG PO TABS
25.0000 mg | ORAL_TABLET | Freq: Once | ORAL | Status: AC
  Administered 2021-11-23: 25 mg via ORAL
  Filled 2021-11-23: qty 2

## 2021-11-23 NOTE — ED Notes (Signed)
Pt stated she does not want to wait for discharge paperwork. Signed AMA form. Ambulatory to lobby ?

## 2021-11-23 NOTE — ED Triage Notes (Signed)
Pt c/o vertigo (hx of same) becoming increasingly worse over the past 2 days. States she lost her glasses 4 months ago and it has been getting worse since then. Also c/o nausea. ?

## 2021-11-23 NOTE — ED Provider Notes (Signed)
?[facility] ?Provider Note ? ? ?CSN: [id number] ?Arrival date & time: 11/23/21  0009 ? ?  ? ?History ? ?Chief Complaint  ?Patient presents with  ? Dizziness  ? ? ?Destiny Beck is a 51 y.o. female. ? ?The history is provided by the patient.  ?Dizziness ?Quality:  Vertigo ?Severity:  Moderate ?Onset quality:  Gradual ?Timing:  Intermittent ?Progression:  Worsening ?Chronicity:  Recurrent ?Relieved by:  Nothing ?Worsened by:  Movement ?Associated symptoms: headaches and nausea   ?Associated symptoms: no chest pain, no hearing loss, no syncope, no tinnitus, no vision changes, no vomiting and no weakness   ?Risk factors: hx of vertigo   ?Risk factors: no hx of stroke   ?Patient presents for vertigo.  Patient reports she has had vertigo for decades.  She reports over the past 2 days worse.  She reports the room spinning with any movement.  She reports nausea without vomiting.  She reports headache that is improving.  Reports it is exacerbated by her not having her reading glasses.  No new vision changes or diplopia. ?No focal weakness.  No Syncope. ?She used to take Antivert but has not had it recently ?  ? ?Home Medications ?Prior to Admission medications   ?Medication Sig Start Date End Date Taking? Authorizing Provider  ?albuterol (VENTOLIN HFA) 108 (90 Base) MCG/ACT inhaler Inhale 1-2 puffs into the lungs every 6 (six) hours as needed for wheezing or shortness of breath.  06/12/19   [provider]  ?buPROPion (WELLBUTRIN XL) 300 MG 24 hr tablet Take 300 mg by mouth in the morning.    [provider]  ?COMBIPATCH 0.05-0.14 MG/DAY Place 1 patch onto the skin 2 (two) times a week. Changes every 3 days (apply to alternating sides) 12/05/19   [provider]  ?Doxylamine-Pyridoxine 10-10 MG TBEC Take 1 each by mouth every 8 (eight) hours as needed (nausea/vomiting). 10/22/21   Harvel Quale, MD  ?gabapentin (NEURONTIN) 600 MG tablet Take 600 mg by mouth 3  (three) times daily.     [provider]  ?hydrOXYzine (ATARAX) 50 MG tablet Take 50 mg by mouth 3 (three) times daily. 06/02/21   [provider]  ?metFORMIN (GLUCOPHAGE) 1000 MG tablet Take 500 mg by mouth 2 (two) times daily. 06/17/21   [provider]  ?Multiple Vitamin (MULTIVITAMIN WITH MINERALS) TABS tablet Take 1 tablet by mouth 3 (three) times a week. ?Patient not taking: Reported on 10/22/2021    [provider]  ?olopatadine (PATANOL) 0.1 % ophthalmic solution Place 1 drop into both eyes 2 (two) times daily as needed for allergies.    [provider]  ?Omega-3 Fatty Acids (FISH OIL) 1200 MG CAPS Take 1,200 mg by mouth in the morning and at bedtime.    [provider]  ?ondansetron (ZOFRAN ODT) 4 MG disintegrating tablet Take 1 tablet (4 mg total) by mouth every 8 (eight) hours as needed for nausea or vomiting. ?Patient not taking: Reported on 07/17/2021 03/07/21   Maudie Flakes, MD  ?pantoprazole (PROTONIX) 40 MG tablet Take 1 tablet (40 mg total) by mouth daily. 10/22/21   Harvel Quale, MD  ?prazosin (MINIPRESS) 2 MG capsule Take 2 mg by mouth at bedtime. 10/30/19   [provider]  ?promethazine (PHENERGAN) 12.5 MG tablet Take 1 tablet (12.5 mg total) by mouth every 6 (six) hours as needed for refractory nausea / vomiting. ?Patient not taking: Reported on 10/22/2021 03/20/21   Virl Cagey, MD  ?  QUEtiapine (SEROQUEL) 100 MG tablet Take by mouth. 09/09/21 11/08/21  [provider]  ?rizatriptan (MAXALT) 10 MG tablet Take 10 mg by mouth every 2 (two) hours as needed for migraine (max 2 tablets/24hrs.). 12/05/19   [provider]  ?rosuvastatin (CRESTOR) 5 MG tablet Take 5 mg by mouth at bedtime. 10/14/21   [provider]  ?sucralfate (CARAFATE) 1 GM/10ML suspension Take 10 mLs (1 g total) by mouth 4 (four) times daily -  with meals and at bedtime. 07/14/21   Carlan, Deatra Robinson, NP  ?venlafaxine XR (EFFEXOR-XR) 150  MG 24 hr capsule Take 150 mg by mouth in the morning. 07/15/21   [provider]  ?   ? ?Allergies    ?Cymbalta [duloxetine hcl], Tylenol [acetaminophen], and Ibuprofen   ? ?Review of Systems   ?Review of Systems  ?Constitutional:  Negative for fever.  ?HENT:  Negative for hearing loss and tinnitus.   ?Cardiovascular:  Negative for chest pain and syncope.  ?Gastrointestinal:  Positive for nausea. Negative for vomiting.  ?Neurological:  Positive for dizziness and headaches. Negative for weakness.  ? ?Physical Exam ?Updated Vital Signs ?BP 116/81   Pulse 80   Temp 98.2 ?F (36.8 ?C)   Resp 14   Ht 1.524 m (5')   Wt 70.8 kg   SpO2 100%   BMI 30.47 kg/m?  ?Physical Exam ?CONSTITUTIONAL: Well developed/well nourished ?HEAD: Normocephalic/atraumatic ?EYES: EOMI/PERRL, no nystagmus,  no ptosis ?ENMT: Mucous membranes moist ?NECK: supple no meningeal signs, no bruits ?CV: S1/S2 noted, no murmurs/rubs/gallops noted ?LUNGS: Lungs are clear to auscultation bilaterally, no apparent distress ?ABDOMEN: soft, nontender, no rebound or guarding ?GU:no cva tenderness ?NEURO:Awake/alert, face symmetric, no arm or leg drift is noted ?Equal 5/5 strength with shoulder abduction, elbow flex/extension, wrist flex/extension in upper extremities and equal hand grips bilaterally ?Equal 5/5 strength with hip flexion,knee flex/extension, foot dorsi/plantar flexion ?Cranial nerves 3/4/5/6/01/17/09/11/12 tested and intact ?Gait slow but steady no ataxia ?No past pointing ?Sensation to light touch intact in all extremities ?EXTREMITIES: pulses normal, full ROM ?SKIN: warm, color normal ?PSYCH: no abnormalities of mood noted ? ?ED Results / Procedures / Treatments   ?Labs ?(all labs ordered are listed, but only abnormal results are displayed) ?Labs Reviewed  ?BASIC METABOLIC PANEL - Abnormal; Notable for the following components:  ?    Result Value  ? Glucose, Bld 141 (*)   ? BUN 22 (*)   ? All other components within normal limits   ?URINALYSIS, ROUTINE W REFLEX MICROSCOPIC - Abnormal; Notable for the following components:  ? Color, Urine STRAW (*)   ? All other components within normal limits  ?CBC  ?PREGNANCY, URINE  ? ? ?EKG ?EKG Interpretation ? ?Date/Time:  Monday Nov 23 2021 00:48:46 EDT ?Ventricular Rate:  94 ?PR Interval:  140 ?QRS Duration: 68 ?QT Interval:  345 ?QTC Calculation: 432 ?R Axis:   70 ?Text Interpretation: Sinus rhythm Paired ventricular premature complexes Aberrant conduction of SV complex(es) Low voltage, precordial leads Anteroseptal infarct, old Baseline wander in lead(s) III Confirmed by Ripley Fraise [phone]) on 11/23/2021 2:40:46 AM ? ?Radiology ?No results found. ? ?Procedures ?Procedures  ? ? ?Medications Ordered in ED ?Medications  ?meclizine (ANTIVERT) tablet 25 mg (25 mg Oral Given 11/23/21 0300)  ? ? ?ED Course/ Medical Decision Making/ A&P ?  ?                        ?Medical Decision Making ?Amount and/or Complexity of  Data Reviewed ?Labs: ordered. ?ECG/medicine tests: ordered. ? ? ?This patient presents to the ED for concern of vertigo and dizziness, this involves an extensive number of treatment options, and is a complaint that carries with it a high risk of complications and morbidity.  The differential diagnosis includes but is not limited to peripheral vertigo, CVA, metabolic disturbance, cardiac arrhythmia ? ?Comorbidities that complicate the patient evaluation: ?Patient?s presentation is complicated by their history of bipolar ? ? ?Additional history obtained: ?Records reviewed  specialty care notes reviewed ? ?Lab Tests: ?I Ordered, and personally interpreted labs.  The pertinent results include: Mild hyperglycemia ? ? ? ?Cardiac Monitoring: ?The patient was maintained on a cardiac monitor.  I personally viewed and interpreted the cardiac monitor which showed an underlying rhythm of:  sinus rhythm ? ?Medicines ordered and prescription drug management: ?I ordered medication including Antivert for  vertigo ?Reevaluation of the patient after these medicines showed that the patient    improved ? ?Test Considered: ?Consider neuroimaging, the patient reports long history of vertigo she has no focal neurodeficits will d

## 2021-12-01 ENCOUNTER — Ambulatory Visit (INDEPENDENT_AMBULATORY_CARE_PROVIDER_SITE_OTHER): Payer: Medicaid Other | Admitting: Podiatry

## 2021-12-01 DIAGNOSIS — Z91199 Patient's noncompliance with other medical treatment and regimen due to unspecified reason: Secondary | ICD-10-CM

## 2021-12-01 NOTE — Progress Notes (Signed)
No show

## 2022-02-22 ENCOUNTER — Ambulatory Visit (INDEPENDENT_AMBULATORY_CARE_PROVIDER_SITE_OTHER): Payer: Medicaid Other | Admitting: Gastroenterology

## 2022-02-22 ENCOUNTER — Encounter (INDEPENDENT_AMBULATORY_CARE_PROVIDER_SITE_OTHER): Payer: Self-pay | Admitting: Gastroenterology

## 2022-07-12 DIAGNOSIS — K3184 Gastroparesis: Secondary | ICD-10-CM

## 2022-07-12 HISTORY — DX: Gastroparesis: K31.84

## 2022-07-19 ENCOUNTER — Other Ambulatory Visit: Payer: Self-pay

## 2022-07-19 ENCOUNTER — Emergency Department (HOSPITAL_COMMUNITY): Payer: Medicaid Other

## 2022-07-19 ENCOUNTER — Encounter (HOSPITAL_COMMUNITY): Payer: Self-pay | Admitting: *Deleted

## 2022-07-19 ENCOUNTER — Emergency Department (HOSPITAL_COMMUNITY)
Admission: EM | Admit: 2022-07-19 | Discharge: 2022-07-19 | Disposition: A | Discharge status: Home / Self Care | Payer: Medicaid Other | Attending: Student | Admitting: Student

## 2022-07-19 DIAGNOSIS — Z87891 Personal history of nicotine dependence: Secondary | ICD-10-CM | POA: Diagnosis not present

## 2022-07-19 DIAGNOSIS — M25511 Pain in right shoulder: Secondary | ICD-10-CM | POA: Diagnosis present

## 2022-07-19 DIAGNOSIS — E119 Type 2 diabetes mellitus without complications: Secondary | ICD-10-CM | POA: Insufficient documentation

## 2022-07-19 DIAGNOSIS — I10 Essential (primary) hypertension: Secondary | ICD-10-CM | POA: Insufficient documentation

## 2022-07-19 DIAGNOSIS — Z79899 Other long term (current) drug therapy: Secondary | ICD-10-CM | POA: Diagnosis not present

## 2022-07-19 MED ORDER — NAPROXEN 375 MG PO TABS: 375.0000 mg | ORAL_TABLET | Freq: Two times a day (BID) | ORAL | 0 refills | Status: DC

## 2022-07-19 MED ORDER — OXYCODONE HCL 5 MG PO TABS
5.0000 mg | ORAL_TABLET | ORAL | 0 refills | Status: DC | PRN
Start: 2022-07-19 — End: 2022-07-20

## 2022-07-19 MED ORDER — NAPROXEN 250 MG PO TABS
500.0000 mg | ORAL_TABLET | Freq: Once | ORAL | Status: AC
Start: 2022-07-19 — End: 2022-07-19
  Administered 2022-07-19: 500 mg via ORAL
  Filled 2022-07-19: qty 2

## 2022-07-19 NOTE — ED Provider Notes (Signed)
[facility] EMERGENCY DEPARTMENT Provider Note  CSN: [id number] Arrival date & time: 07/19/22 1093  Chief Complaint(s) Arm Pain  HPI Destiny Beck is a 52 y.o. female who presents emergency department for evaluation of right shoulder pain.  Patient states that she was sleeping on the couch starting a few weeks ago and felt like she slept on that side abnormally.  Since then she has been unable to range the shoulder and has significant tenderness at the proximal humerus and the lateral deltoid.  Denies known trauma.  Denies chest pain, shortness of breath, abdominal pain, nausea, vomiting, fever, chills or other systemic symptoms.  She has gone to urgent care and received Toradol and prednisone and has not had significant symptomatic improvement.   Past Medical History Past Medical History:  Diagnosis Date   Abscess    Anemia    Anxiety    Arthritis    Chronic back pain    Chronic fatigue    Chronic neck pain    Depression    Diabetes mellitus without complication (HCC)    Dizziness    Fibromyalgia    GERD (gastroesophageal reflux disease)    Headache    Hypertension    Occipital neuralgia    Sciatica    Patient Active Problem List   Diagnosis Date Noted   Gastroesophageal reflux disease 07/14/2021   Nausea without vomiting 07/14/2021   Cholelithiasis 03/18/2021   Constipation 11/06/2018   Bloating 11/06/2018   Abdominal distention 11/06/2018   Heme positive stool 07/26/2018   Absolute anemia 07/26/2018   Home Medication(s) Prior to Admission medications   Medication Sig Start Date End Date Taking? Authorizing Provider  albuterol (VENTOLIN HFA) 108 (90 Base) MCG/ACT inhaler Inhale 1-2 puffs into the lungs every 6 (six) hours as needed for wheezing or shortness of breath.  06/12/19   [provider]  buPROPion (WELLBUTRIN XL) 300 MG 24 hr tablet Take 300 mg by mouth in the morning.    [provider]  COMBIPATCH 0.05-0.14 MG/DAY Place 1 patch onto the  skin 2 (two) times a week. Changes every 3 days (apply to alternating sides) 12/05/19   [provider]  Doxylamine-Pyridoxine 10-10 MG TBEC Take 1 each by mouth every 8 (eight) hours as needed (nausea/vomiting). 10/22/21   Harvel Quale, MD  gabapentin (NEURONTIN) 600 MG tablet Take 600 mg by mouth 3 (three) times daily.     [provider]  hydrOXYzine (ATARAX) 50 MG tablet Take 50 mg by mouth 3 (three) times daily. 06/02/21   [provider]  metFORMIN (GLUCOPHAGE) 1000 MG tablet Take 500 mg by mouth 2 (two) times daily. 06/17/21   [provider]  Multiple Vitamin (MULTIVITAMIN WITH MINERALS) TABS tablet Take 1 tablet by mouth 3 (three) times a week. Patient not taking: Reported on 10/22/2021    [provider]  olopatadine (PATANOL) 0.1 % ophthalmic solution Place 1 drop into both eyes 2 (two) times daily as needed for allergies.    [provider]  Omega-3 Fatty Acids (FISH OIL) 1200 MG CAPS Take 1,200 mg by mouth in the morning and at bedtime.    [provider]  ondansetron (ZOFRAN ODT) 4 MG disintegrating tablet Take 1 tablet (4 mg total) by mouth every 8 (eight) hours as needed for nausea or vomiting. Patient not taking: Reported on 07/17/2021 03/07/21   Maudie Flakes, MD  pantoprazole (PROTONIX) 40 MG tablet Take 1 tablet (40 mg total) by mouth daily. 10/22/21  Montez Morita, Quillian Quince, MD  prazosin (MINIPRESS) 2 MG capsule Take 2 mg by mouth at bedtime. 10/30/19   [provider]  promethazine (PHENERGAN) 12.5 MG tablet Take 1 tablet (12.5 mg total) by mouth every 6 (six) hours as needed for refractory nausea / vomiting. Patient not taking: Reported on 10/22/2021 03/20/21   Virl Cagey, MD  QUEtiapine (SEROQUEL) 100 MG tablet Take by mouth. 09/09/21 11/08/21  [provider]  rizatriptan (MAXALT) 10 MG tablet Take 10 mg by mouth every 2 (two) hours as needed for migraine (max 2 tablets/24hrs.).  12/05/19   [provider]  rosuvastatin (CRESTOR) 5 MG tablet Take 5 mg by mouth at bedtime. 10/14/21   [provider]  sucralfate (CARAFATE) 1 GM/10ML suspension Take 10 mLs (1 g total) by mouth 4 (four) times daily -  with meals and at bedtime. 07/14/21   Carlan, Chelsea L, NP  venlafaxine XR (EFFEXOR-XR) 150 MG 24 hr capsule Take 150 mg by mouth in the morning. 07/15/21   [provider]                                                                                                                                    Past Surgical History Past Surgical History:  Procedure Laterality Date   BIOPSY  08/04/2018   Procedure: BIOPSY;  Surgeon: Rogene Houston, MD;  Location: [facility];  Service: Endoscopy;;  gastric    BIOPSY  07/24/2021   Procedure: BIOPSY;  Surgeon: Harvel Quale, MD;  Location: [facility];  Service: Gastroenterology;;   CESAREAN SECTION     x2   CHOLECYSTECTOMY N/A 03/20/2021   Procedure: LAPAROSCOPIC CHOLECYSTECTOMY;  Surgeon: Virl Cagey, MD;  Location: [facility];  Service: General;  Laterality: N/A;   COLONOSCOPY WITH PROPOFOL N/A 08/04/2018   normal, external hemorrhoids   DILATION AND CURETTAGE OF UTERUS     with ablation   ESOPHAGOGASTRODUODENOSCOPY (EGD) WITH PROPOFOL N/A 08/04/2018   Normal proximal esophagus, mid esophagus and distal esophagus. 2cm hh, scar in prepyloric region of stomach, gastritis, normal duodenal bulb and second portion of duodenum   ESOPHAGOGASTRODUODENOSCOPY (EGD) WITH PROPOFOL N/A 07/24/2021   Procedure: ESOPHAGOGASTRODUODENOSCOPY (EGD) WITH PROPOFOL;  Surgeon: Harvel Quale, MD;  Location: [facility];  Service: Gastroenterology;  Laterality: N/A;  730   TUBAL LIGATION     Family History History reviewed. No pertinent family history.  Social History Social History   Tobacco Use   Smoking status: Former    Packs/day: 0.50    Years: 30.00    Total pack years: 15.00    Types:  Cigarettes    Quit date: 01/07/2017    Years since quitting: 5.5   Smokeless tobacco: Never  Vaping Use   Vaping Use: Never used  Substance Use Topics   Alcohol use: Yes    Comment: occ   Drug use: Yes  Types: Marijuana    Comment: occ   Allergies Cymbalta [duloxetine hcl], Tylenol [acetaminophen], and Ibuprofen  Review of Systems Review of Systems  Musculoskeletal:  Positive for arthralgias.    Physical Exam Vital Signs  I have reviewed the triage vital signs BP 105/83   Pulse 85   Temp 98.4 F (36.9 C) (Oral)   Resp 16   Ht 5' (1.524 m)   Wt 68 kg   SpO2 97%   BMI 29.29 kg/m   Physical Exam Vitals and nursing note reviewed.  Constitutional:      General: She is not in acute distress.    Appearance: She is well-developed.  HENT:     Head: Normocephalic and atraumatic.  Eyes:     Conjunctiva/sclera: Conjunctivae normal.  Cardiovascular:     Rate and Rhythm: Normal rate and regular rhythm.     Heart sounds: No murmur heard. Pulmonary:     Effort: Pulmonary effort is normal. No respiratory distress.     Breath sounds: Normal breath sounds.  Abdominal:     Palpations: Abdomen is soft.     Tenderness: There is no abdominal tenderness.  Musculoskeletal:        General: Tenderness present. No swelling.     Cervical back: Neck supple.  Skin:    General: Skin is warm and dry.     Capillary Refill: Capillary refill takes less than 2 seconds.  Neurological:     Mental Status: She is alert.  Psychiatric:        Mood and Affect: Mood normal.     ED Results and Treatments Labs (all labs ordered are listed, but only abnormal results are displayed) Labs Reviewed - No data to display                                                                                                                        Radiology No results found.  Pertinent labs & imaging results that were available during my care of the patient were reviewed by me and considered in my medical  decision making (see MDM for details).  Medications Ordered in ED Medications - No data to display                                                                                                                                   Procedures Procedures  (including critical care time)  Medical Decision  Making / ED Course   This patient presents to the ED for concern of shoulder pain, this involves an extensive number of treatment options, and is a complaint that carries with it a high risk of complications and morbidity.  The differential diagnosis includes arthritis, bone lesion, fracture, rotator cuff injury, frozen shoulder, muscle strain  MDM: Patient seen emerged part for evaluation of shoulder pain.  Physical exam with tenderness over the lateral deltoid, proximal humerus and along the supraspinatus with range of motion limited only by pain.  X-ray imaging with mild spurring but no acute pathology.  Patient placed in a sling and was started on Naprosyn, Tylenol and oxycodone was provided for breakthrough pain only.  A referral was sent to orthopedics and patient discharged with outpatient follow-up   Additional history obtained:  -External records from outside source obtained and reviewed including: Chart review including previous notes, labs, imaging, consultation notes   Imaging Studies ordered: I ordered imaging studies including XR shoulder I independently visualized and interpreted imaging. I agree with the radiologist interpretation   Medicines ordered and prescription drug management: No orders of the defined types were placed in this encounter.   -I have reviewed the patients home medicines and have made adjustments as needed  Critical interventions none   Cardiac Monitoring: The patient was maintained on a cardiac monitor.  I personally viewed and interpreted the cardiac monitored which showed an underlying rhythm of: NSR  Social Determinants of Health:  Factors  impacting patients care include: none   Reevaluation: After the interventions noted above, I reevaluated the patient and found that they have :improved  Co morbidities that complicate the patient evaluation  Past Medical History:  Diagnosis Date   Abscess    Anemia    Anxiety    Arthritis    Chronic back pain    Chronic fatigue    Chronic neck pain    Depression    Diabetes mellitus without complication (HCC)    Dizziness    Fibromyalgia    GERD (gastroesophageal reflux disease)    Headache    Hypertension    Occipital neuralgia    Sciatica       Dispostion: I considered admission for this patient, but she does not meet inpatient criteria for admission and she is safe for discharge with outpatient orthopedic follow-up     Final Clinical Impression(s) / ED Diagnoses Final diagnoses:  None     '@PCDICTATION'$ @    Teressa Lower, MD 07/19/22 1109

## 2022-07-19 NOTE — ED Triage Notes (Signed)
Pt c/o right arm pain around shoulder;  pt states she has been to urgent care and was given toradol and prednisone with no relief  Arm pain initially started a couple of weeks ago; pt denies any obvious injury

## 2022-07-20 ENCOUNTER — Telehealth (HOSPITAL_COMMUNITY): Payer: Self-pay | Admitting: Student

## 2022-07-20 MED ORDER — OXYCODONE HCL 5 MG PO TABS: 5.0000 mg | ORAL_TABLET | ORAL | 0 refills | Status: DC | PRN

## 2022-07-20 NOTE — Telephone Encounter (Signed)
Note created to send opioids to be pharmacy that has them in stock

## 2022-07-20 NOTE — ED Notes (Signed)
Pt requested Oxycodone be transferred to [facility] in [place] due to her pharmacy is out of stock . EDP aware and tranfered medication as requested. Pt aware.  07/20/22 '@1230hrs'$ 

## 2022-08-05 ENCOUNTER — Ambulatory Visit (INDEPENDENT_AMBULATORY_CARE_PROVIDER_SITE_OTHER): Payer: Medicaid Other | Admitting: Orthopedic Surgery

## 2022-08-05 ENCOUNTER — Encounter: Payer: Self-pay | Admitting: Orthopedic Surgery

## 2022-08-05 VITALS — BP 124/82 | HR 72 | Ht 60.0 in | Wt 160.0 lb

## 2022-08-05 DIAGNOSIS — M7551 Bursitis of right shoulder: Secondary | ICD-10-CM

## 2022-08-05 MED ORDER — CELECOXIB 200 MG PO CAPS: 200.0000 mg | ORAL_CAPSULE | Freq: Two times a day (BID) | ORAL | 0 refills | Status: DC

## 2022-08-05 MED ORDER — METHYLPREDNISOLONE ACETATE 40 MG/ML IJ SUSP
40.0000 mg | Freq: Once | INTRAMUSCULAR | Status: AC
  Administered 2022-08-05: 40 mg via INTRA_ARTICULAR

## 2022-08-05 NOTE — Patient Instructions (Signed)

## 2022-08-05 NOTE — Progress Notes (Signed)
Chief Complaint  Patient presents with   Shoulder Pain    Right / painful to sleep on the right side    The patient was seen in the emergency room comes in with painful right shoulder.  She says for couple of months after sleeping on her right side she started having pain in her right shoulder deltoid and noticed tried changing her range of motion.  She went to the urgent care center she was treated there with Toradol and prednisone she did not improve she went to the emergency room she was given naproxen which she really cannot take  Presents now with persistent pain and painful range of motion and difficulty sleeping on the right shoulder  Physical Exam Vitals and nursing note reviewed.  Constitutional:      Appearance: Normal appearance.  HENT:     Head: Normocephalic and atraumatic.  Eyes:     General: No scleral icterus.       Right eye: No discharge.        Left eye: No discharge.     Extraocular Movements: Extraocular movements intact.     Conjunctiva/sclera: Conjunctivae normal.     Pupils: Pupils are equal, round, and reactive to light.  Cardiovascular:     Rate and Rhythm: Normal rate.     Pulses: Normal pulses.  Musculoskeletal:     Comments: Examination right shoulder  Skin is normal  Tenderness posterior to the acromion lateral deltoid anterior deltoid rotator interval.  External rotation normal with the arm at her side.  Abduction painful 0 to 90 degrees.  Flexion painful 0 to 110 degrees.  Painful internal rotation and empty can position but no weakness  Positive impingement sign at 110 degrees of flexion  Neck nontender.  Skin:    General: Skin is warm and dry.     Capillary Refill: Capillary refill takes less than 2 seconds.  Neurological:     General: No focal deficit present.     Mental Status: She is alert and oriented to person, place, and time.  Psychiatric:        Mood and Affect: Mood normal.        Behavior: Behavior normal.        Thought  Content: Thought content normal.        Judgment: Judgment normal.    I reviewed the outside images from the hospital ER there is no fracture dislocation or glenoid abnormalities seen on the 3 views taken  My impression  Encounter Diagnosis  Name Primary?   Bursitis of right shoulder Yes    I think she has bursitis of the shoulder.  Her should her shoulder x-rays caught some of her neck and she has some degenerative changes there.  However she is not having any radicular symptoms  Recommend subacromial injection and anti-inflammatories    Procedure note the subacromial injection shoulder RIGHT    Verbal consent was obtained to inject the  RIGHT   Shoulder  Timeout was completed to confirm the injection site is a subacromial space of the  RIGHT  shoulder   Medication used Depo-Medrol 40 mg and lidocaine 1% 3 cc  Anesthesia was provided by ethyl chloride  The injection was performed in the RIGHT  posterior subacromial space. After pinning the skin with alcohol and anesthetized the skin with ethyl chloride the subacromial space was injected using a 20-gauge needle. There were no complications  Sterile dressing was applied.

## 2022-08-25 ENCOUNTER — Ambulatory Visit: Payer: Medicaid Other | Admitting: Orthopedic Surgery

## 2022-09-02 ENCOUNTER — Ambulatory Visit: Payer: Medicaid Other | Admitting: Orthopedic Surgery

## 2022-09-09 ENCOUNTER — Encounter: Payer: Self-pay | Admitting: Radiology

## 2022-09-20 ENCOUNTER — Ambulatory Visit: Payer: Medicaid Other | Admitting: Orthopedic Surgery

## 2022-10-07 ENCOUNTER — Ambulatory Visit (INDEPENDENT_AMBULATORY_CARE_PROVIDER_SITE_OTHER): Payer: Medicaid Other | Admitting: Gastroenterology

## 2022-10-07 ENCOUNTER — Encounter (INDEPENDENT_AMBULATORY_CARE_PROVIDER_SITE_OTHER): Payer: Self-pay

## 2022-10-14 ENCOUNTER — Encounter: Payer: Self-pay | Admitting: Orthopedic Surgery

## 2022-10-14 ENCOUNTER — Encounter (INDEPENDENT_AMBULATORY_CARE_PROVIDER_SITE_OTHER): Payer: Self-pay | Admitting: Gastroenterology

## 2022-10-14 ENCOUNTER — Ambulatory Visit (INDEPENDENT_AMBULATORY_CARE_PROVIDER_SITE_OTHER): Payer: Medicaid Other | Admitting: Gastroenterology

## 2022-10-14 ENCOUNTER — Telehealth (INDEPENDENT_AMBULATORY_CARE_PROVIDER_SITE_OTHER): Payer: Self-pay | Admitting: Gastroenterology

## 2022-10-14 ENCOUNTER — Ambulatory Visit (INDEPENDENT_AMBULATORY_CARE_PROVIDER_SITE_OTHER): Payer: Medicaid Other | Admitting: Orthopedic Surgery

## 2022-10-14 VITALS — BP 115/77 | HR 108 | Temp 97.5°F | Ht 60.0 in | Wt 160.6 lb

## 2022-10-14 DIAGNOSIS — M7551 Bursitis of right shoulder: Secondary | ICD-10-CM

## 2022-10-14 DIAGNOSIS — M75111 Incomplete rotator cuff tear or rupture of right shoulder, not specified as traumatic: Secondary | ICD-10-CM | POA: Diagnosis not present

## 2022-10-14 DIAGNOSIS — G8929 Other chronic pain: Secondary | ICD-10-CM | POA: Diagnosis not present

## 2022-10-14 DIAGNOSIS — M25511 Pain in right shoulder: Secondary | ICD-10-CM

## 2022-10-14 DIAGNOSIS — R110 Nausea: Secondary | ICD-10-CM

## 2022-10-14 DIAGNOSIS — K2100 Gastro-esophageal reflux disease with esophagitis, without bleeding: Secondary | ICD-10-CM | POA: Diagnosis not present

## 2022-10-14 MED ORDER — PROMETHAZINE HCL 25 MG PO TABS: 25.0000 mg | ORAL_TABLET | Freq: Three times a day (TID) | ORAL | 1 refills | Status: DC | PRN

## 2022-10-14 MED ORDER — METHYLPREDNISOLONE ACETATE 40 MG/ML IJ SUSP
40.0000 mg | Freq: Once | INTRAMUSCULAR | Status: AC
  Administered 2022-10-14: 40 mg via INTRA_ARTICULAR

## 2022-10-14 MED ORDER — METHOCARBAMOL 500 MG PO TABS: 500.0000 mg | ORAL_TABLET | Freq: Three times a day (TID) | ORAL | 1 refills | Status: DC

## 2022-10-14 MED ORDER — PREDNISONE 10 MG (48) PO TBPK: ORAL_TABLET | Freq: Every day | ORAL | 0 refills | Status: DC

## 2022-10-14 MED ORDER — DEXLANSOPRAZOLE 60 MG PO CPDR: 60.0000 mg | DELAYED_RELEASE_CAPSULE | Freq: Every day | ORAL | 3 refills | Status: AC

## 2022-10-14 NOTE — Telephone Encounter (Signed)
Pt returned call and verbalized understanding  

## 2022-10-14 NOTE — Patient Instructions (Signed)
No use right arm  While we are working on your approval for MRI please go ahead and call to schedule your appointment with [facility] within at least one (1) week.   Central Scheduling [phone]

## 2022-10-14 NOTE — Telephone Encounter (Signed)
Gastric Emptying scheduled for 10/20/22 @ [facility]. No stomach meds morning of exam. NPO after midnight  Left message to return call

## 2022-10-14 NOTE — Progress Notes (Signed)
Destiny Beck, M.D. Gastroenterology & Hepatology [facility] [address] [place],  [place] [address]  Primary Care Physician: Bridget Hartshorn, NP [place] [address]  [id number]  I will communicate my assessment and recommendations to the referring MD via EMR.  Problems: Chronic nausea GERD Grade A esophagitis  History of Present Illness: Destiny Beck is a 52 y.o. female with past medical history of anemia, anxiety, arthritis, fatigue, back pain, depression, DM, fibromyalgia, GERD, HTN, occipital neuralgia, who came to the clinic for follow-up of GERD and nausea.   The patient was last seen on 10/22/2021. At that time, the patient was advised to start pyridoxine-doxylamine 10-10 mg every 8 h as needed for nausea with breakthrough use of Phenergan as needed.  She was also switched to pantoprazole 40 mg every day.  Patient reports that she feels constantly nauseated which has interfered with her daily activities. She reports that she feels more nauseated in the morning. She states that after she sneezes, the symptoms go away. She states that she has been using "Yellow Mellow" CBD vape which prevents issues with more nausea episodes during the day. She rarely vomits. She has also presented decreased appetite. Did not feel any improvement with pyridoxine-doxylamine. Has not used Phenergan in a long time.  She reports having heartburn frequently during the day. She has tried to avoid pizza and burgers in order to avoid the symptoms. She has not felt Protonix has helped at all. Her insurance did not cover the Dexilant unless she has tried all medicines before - notably she has failed Pepcid, omeprazole, Prevacid. Has noticed halitosis regularly during the day.  The patient denies having any fever, chills, hematochezia, melena, hematemesis, abdominal distention, abdominal pain, diarrhea, jaundice, pruritus or weight  loss.  Most recent CT of the abdomen and pelvis with IV contrast performed on 03/06/2021 showed cholelithiasis without cholecystitis and hepatic steatosis.  Her most recent CMP from 03/06/2021 showed an AST of 26, ALT of 25.  Last EGD: 07/24/21 Grade A esophagitis. Normal stomach duodenum normal gastric and SB biopsies   Last Colonoscopy: 08/07/2018 - normal, external hemorrhoids Advised to repeat in 5 years  Past Medical History: Past Medical History:  Diagnosis Date   Abscess    Anemia    Anxiety    Arthritis    Chronic back pain    Chronic fatigue    Chronic neck pain    Depression    Diabetes mellitus without complication    Dizziness    Fibromyalgia    GERD (gastroesophageal reflux disease)    Headache    Hypertension    Occipital neuralgia    Sciatica     Past Surgical History: Past Surgical History:  Procedure Laterality Date   BIOPSY  08/04/2018   Procedure: BIOPSY;  Surgeon: Rogene Houston, MD;  [facility];  Service: Endoscopy;;  gastric    BIOPSY  07/24/2021   Procedure: BIOPSY;  Surgeon: Harvel Quale, MD;  [facility];  Service: Gastroenterology;;   CESAREAN SECTION     x2   CHOLECYSTECTOMY N/A 03/20/2021   Procedure: LAPAROSCOPIC CHOLECYSTECTOMY;  Surgeon: Virl Cagey, MD;  Location: [facility];  Service: General;  Laterality: N/A;   COLONOSCOPY WITH PROPOFOL N/A 08/04/2018   normal, external hemorrhoids   DILATION AND CURETTAGE OF UTERUS     with ablation   ESOPHAGOGASTRODUODENOSCOPY (EGD) WITH PROPOFOL N/A 08/04/2018   Normal proximal esophagus, mid esophagus and  distal esophagus. 2cm hh, scar in prepyloric region of stomach, gastritis, normal duodenal bulb and second portion of duodenum   ESOPHAGOGASTRODUODENOSCOPY (EGD) WITH PROPOFOL N/A 07/24/2021   Procedure: ESOPHAGOGASTRODUODENOSCOPY (EGD) WITH PROPOFOL;  Surgeon: Harvel Quale, MD;  [facility];  Service: Gastroenterology;  Laterality:  N/A;  730   TUBAL LIGATION      Family History:History reviewed. No pertinent family history.  Social History: Social History   Tobacco Use  Smoking Status Former   Packs/day: 0.50   Years: 30.00   Additional pack years: 0.00   Total pack years: 15.00   Types: Cigarettes   Quit date: 01/07/2017   Years since quitting: 5.7  Smokeless Tobacco Never   Social History   Substance and Sexual Activity  Alcohol Use Yes   Comment: occ   Social History   Substance and Sexual Activity  Drug Use Yes   Types: Marijuana   Comment: occ    Allergies: Allergies  Allergen Reactions   Cymbalta [Duloxetine Hcl] Other (See Comments)    Stroke like symptoms    Ibuprofen Other (See Comments)    Due to anemia   Tylenol [Acetaminophen] Other (See Comments)    Due to complications with Liver     Medications: Current Outpatient Medications  Medication Sig Dispense Refill   hydrOXYzine (ATARAX) 50 MG tablet Take 50 mg by mouth 3 (three) times daily.     metFORMIN (GLUCOPHAGE) 1000 MG tablet Take 500 mg by mouth 2 (two) times daily.     olopatadine (PATANOL) 0.1 % ophthalmic solution Place 1 drop into both eyes 2 (two) times daily as needed for allergies.     Omega-3 Fatty Acids (FISH OIL) 1200 MG CAPS Take 1,200 mg by mouth in the morning and at bedtime.     pantoprazole (PROTONIX) 40 MG tablet Take 1 tablet (40 mg total) by mouth daily. 90 tablet 3   prazosin (MINIPRESS) 2 MG capsule Take 2 mg by mouth at bedtime.     rosuvastatin (CRESTOR) 5 MG tablet Take 5 mg by mouth at bedtime.     venlafaxine XR (EFFEXOR-XR) 150 MG 24 hr capsule Take 300 mg by mouth in the morning.     No current facility-administered medications for this visit.    Review of Systems: GENERAL: negative for malaise, night sweats HEENT: No changes in hearing or vision, no nose bleeds or other nasal problems. NECK: Negative for lumps, goiter, pain and significant neck swelling RESPIRATORY: Negative for cough,  wheezing CARDIOVASCULAR: Negative for chest pain, leg swelling, palpitations, orthopnea GI: SEE HPI MUSCULOSKELETAL: Negative for joint pain or swelling, back pain, and muscle pain. SKIN: Negative for lesions, rash PSYCH: Negative for sleep disturbance, mood disorder and recent psychosocial stressors. HEMATOLOGY Negative for prolonged bleeding, bruising easily, and swollen nodes. ENDOCRINE: Negative for cold or heat intolerance, polyuria, polydipsia and goiter. NEURO: negative for tremor, gait imbalance, syncope and seizures. The remainder of the review of systems is noncontributory.   Physical Exam: BP 115/77 (BP Location: Left Arm, Patient Position: Sitting, Cuff Size: Large)   Pulse (!) 108   Temp (!) 97.5 F (36.4 C) (Temporal)   Ht 5' (1.524 m)   Wt 160 lb 9.6 oz (72.8 kg)   BMI 31.37 kg/m  GENERAL: The patient is AO x3, in no acute distress. HEENT: Head is normocephalic and atraumatic. EOMI are intact. Mouth is well hydrated and without lesions. NECK: Supple. No masses LUNGS: Clear to auscultation. No presence of rhonchi/wheezing/rales. Adequate chest  expansion HEART: RRR, normal s1 and s2. ABDOMEN: Soft, nontender, no guarding, no peritoneal signs, and nondistended. BS +. No masses. EXTREMITIES: Without any cyanosis, clubbing, rash, lesions or edema. NEUROLOGIC: AOx3, no focal motor deficit. SKIN: no jaundice, no rashes  Imaging/Labs: as above  I personally reviewed and interpreted the available labs, imaging and endoscopic files.  Impression and Plan: Destiny Beck is a 52 y.o. female with past medical history of anemia, anxiety, arthritis, fatigue, back pain, depression, DM, fibromyalgia, GERD, HTN, occipital neuralgia, who came to the clinic for follow-up of GERD and nausea. Patient has presented recurrent problems with nausea that has been refractory to different medications. Endoscopic evaluation and abdominal CT has been unremarkable. We will evaluate he nausea  further with a GES. For now, phenergan will be refilled to try to alleviate symptoms further. I discouraged vaping given possible long term risks.  She is still presenting some GERD associated symptoms despite taking pantoprazole compliantly. She has failed MULTIPLE PPIs in the past. We will resend Dexilant today as she previously had better control of symptoms with this medicine, until her insurance denied coverage unless she failed other PPIs, which she has presented. Dexilant also helped in the past with her nausea, so GERD may be causing some of her nausea. We also discussed TIF as an option for GERD treatment, she will read further about this option.  -Schedule 4-hour gastric emptying study -Start Dexilant 60 mg qday -Stop Protonix -Refilled phenergan PRN for nausea -Discouraged use of vapes -The patient and I held a thorough discussion about potential nonpharmacologic treatments for reflux such as transoral Incisionless Fundoplication (TIF). Pamphlet provided  All questions were answered.      Destiny Peppers, MD Gastroenterology and Hepatology [facility]

## 2022-10-14 NOTE — Patient Instructions (Addendum)
Schedule 4-hour gastric emptying study Start Dexilant 60 mg qday Stop Protonix Refilled phenergan PRN for nausea Discouraged use of vapes The patient and I held a thorough discussion about potential nonpharmacologic treatments for reflux such as transoral Incisionless Fundoplication (TIF). Pamphlet provided

## 2022-10-14 NOTE — Progress Notes (Addendum)
Office Visit Note   Patient: Destiny Beck           Date of Birth: June 11, 1971           MRN: [id number] Visit Date: 10/14/2022 Requested by: Rebecka Apley, NP [address] [place],  [place] [address] PCP: Rebecka Apley, NP  Subjective: Chief Complaint  Patient presents with   Shoulder Pain    Right / upper arm and shoulder painful     HPI: (08/05/22)The patient was seen in the emergency room comes in with painful right shoulder.  She says for couple of months after sleeping on her right side she started having pain in her right shoulder deltoid and noticed tried changing her range of motion.  She went to the urgent care center she was treated there with Toradol and prednisone she did not improve she went to the emergency room she was given naproxen which she really cannot take   Presents now with persistent pain and painful range of motion and difficulty sleeping on the right shoulder  Subacromial injection was done   Today: Today she is in severe pain.  Flexion and abduction: Passive range of motion is severely painful fortunately she has not developed adhesive capsulitis is internal/external rotation is still pretty good  She continue to try to do an exercise routine that involved weights in her right hand and shoulder exercises and it was painful  Active motion is less than 60 degrees              ROS: no radicular symptoms  Assessment & Plan:   Procedure note the subacromial injection shoulder RIGHT    Verbal consent was obtained to inject the  RIGHT   Shoulder  Timeout was completed to confirm the injection site is a subacromial space of the  RIGHT  shoulder   Medication used Depo-Medrol 40 mg and lidocaine 1% 3 cc  Anesthesia was provided by ethyl chloride  The injection was performed in the RIGHT  posterior subacromial space. After pinning the skin with alcohol and anesthetized the skin with ethyl chloride the subacromial space  was injected using a 20-gauge needle. There were no complications  Sterile dressing was applied.    Images personally read and my interpretation : No new images  Visit Diagnoses:  1. Nontraumatic incomplete tear of right rotator cuff   2. Bursitis of right shoulder   3. Chronic right shoulder pain     Plan:  Rest stop all exercise routines with the right upper extremity  Meds ordered this encounter  Medications   predniSONE (STERAPRED UNI-PAK 48 TAB) 10 MG (48) TBPK tablet    Sig: Take by mouth daily.    Dispense:  48 tablet    Refill:  0   methocarbamol (ROBAXIN) 500 MG tablet    Sig: Take 1 tablet (500 mg total) by mouth 3 (three) times daily.    Dispense:  60 tablet    Refill:  1   methylPREDNISolone acetate (DEPO-MEDROL) injection 40 mg   Right right shoulder  Follow-Up Instructions: Return for FOLLOW UP, MRI RESULTS.   Orders:  Meds ordered this encounter  Medications   predniSONE (STERAPRED UNI-PAK 48 TAB) 10 MG (48) TBPK tablet    Sig: Take by mouth daily.    Dispense:  48 tablet    Refill:  0   methocarbamol (ROBAXIN) 500 MG tablet    Sig: Take 1 tablet (500 mg total) by mouth 3 (three)  times daily.    Dispense:  60 tablet    Refill:  1   methylPREDNISolone acetate (DEPO-MEDROL) injection 40 mg      Objective: Vital Signs: There were no vitals taken for this visit.  Physical Exam Constitutional:      Appearance: Normal appearance.  Neurological:     Mental Status: She is alert.      Right Shoulder Exam   Tenderness  Right shoulder tenderness location: Global tenderness lateral deltoid anterior deltoid biceps posterior rotator cuff posterior joint line anterior joint line.  Range of Motion  Active abduction:  abnormal  Passive abduction:  abnormal  Extension:  abnormal  External rotation:  abnormal  Internal rotation 0 degrees:  normal  Internal rotation 90 degrees:  normal   Muscle Strength  Abduction: 3/5  Supraspinatus: 3/5   Subscapularis: 5/5   Tests  Apprehension: negative Cross arm: negative Impingement: positive Drop arm: negative Sulcus: absent  Other  Erythema: absent Scars: absent Sensation: normal Pulse: present       Specialty Comments:  No specialty comments available.  Imaging: No results found.   PMFS History: Patient Active Problem List   Diagnosis Date Noted   Bipolar 2 disorder, major depressive episode 09/09/2021   Cannabis dependence, continuous 07/15/2021   Insomnia due to other mental disorder 07/15/2021   Gastroesophageal reflux disease 07/14/2021   Chronic nausea 07/14/2021   Carpal tunnel syndrome of left wrist 12/14/2019   Lateral epicondylitis of left elbow 12/14/2019   Pain in joint of left elbow 12/12/2019   Dyslipidemia associated with type 2 diabetes mellitus 04/22/2019   Radiculopathy of lumbar region 01/26/2019   Type 2 diabetes mellitus without complication, without long-term current use of insulin 12/11/2018   Fatty liver 12/08/2018   Constipation 11/06/2018   Bloating 11/06/2018   Abdominal distention 11/06/2018   Migraine without aura and without status migrainosus, not intractable 09/19/2018   Arthropathy of cervical facet joint 08/27/2018   Chronic pain disorder 08/27/2018   Cervical spondylosis without myelopathy 08/27/2018   Lumbar facet joint pain 08/27/2018   Fibroids 08/15/2018   Heme positive stool 07/26/2018   Absolute anemia 07/26/2018   Lumbar degenerative disc disease 06/29/2018   Acute right-sided back pain with sciatica 06/01/2018   Chronic use of benzodiazepine for therapeutic purpose 06/01/2018   Current moderate episode of major depressive disorder 09/05/2017   Panic disorder with agoraphobia 06/15/2017   Chronic fatigue 11/09/2016   Need for tetanus, diphtheria, and acellular pertussis (Tdap) vaccine in patient of adolescent age or older 11/04/2015   DDD (degenerative disc disease), cervical 01/31/2014   Past Medical  History:  Diagnosis Date   Abscess    Anemia    Anxiety    Arthritis    Chronic back pain    Chronic fatigue    Chronic neck pain    Depression    Diabetes mellitus without complication    Dizziness    Fibromyalgia    GERD (gastroesophageal reflux disease)    Headache    Hypertension    Occipital neuralgia    Sciatica     History reviewed. No pertinent family history.  Past Surgical History:  Procedure Laterality Date   BIOPSY  08/04/2018   Procedure: BIOPSY;  Surgeon: Malissa Hippo, MD;  [facility];  Service: Endoscopy;;  gastric    BIOPSY  07/24/2021   Procedure: BIOPSY;  Surgeon: Dolores Frame, MD;  [facility];  Service: Gastroenterology;;   CESAREAN SECTION  x2   CHOLECYSTECTOMY N/A 03/20/2021   Procedure: LAPAROSCOPIC CHOLECYSTECTOMY;  Surgeon: Lucretia Roers, MD;  Location: [facility];  Service: General;  Laterality: N/A;   COLONOSCOPY WITH PROPOFOL N/A 08/04/2018   normal, external hemorrhoids   DILATION AND CURETTAGE OF UTERUS     with ablation   ESOPHAGOGASTRODUODENOSCOPY (EGD) WITH PROPOFOL N/A 08/04/2018   Normal proximal esophagus, mid esophagus and distal esophagus. 2cm hh, scar in prepyloric region of stomach, gastritis, normal duodenal bulb and second portion of duodenum   ESOPHAGOGASTRODUODENOSCOPY (EGD) WITH PROPOFOL N/A 07/24/2021   Procedure: ESOPHAGOGASTRODUODENOSCOPY (EGD) WITH PROPOFOL;  Surgeon: Dolores Frame, MD;  [facility];  Service: Gastroenterology;  Laterality: N/A;  730   TUBAL LIGATION     Social History   Occupational History   Not on file  Tobacco Use   Smoking status: Former    Packs/day: 0.50    Years: 30.00    Additional pack years: 0.00    Total pack years: 15.00    Types: Cigarettes    Quit date: 01/07/2017    Years since quitting: 5.7   Smokeless tobacco: Never  Vaping Use   Vaping Use: Never used  Substance and Sexual Activity   Alcohol use: Yes     Comment: occ   Drug use: Yes    Types: Marijuana    Comment: occ   Sexual activity: Yes    Birth control/protection: Surgical

## 2022-10-18 ENCOUNTER — Ambulatory Visit: Payer: Medicaid Other | Admitting: Orthopedic Surgery

## 2022-10-18 NOTE — Telephone Encounter (Signed)
Approval from [facility] for Gastic Emptying Auth Number: [id number] Exp-12/02/22  Shawna Orleans from [facility] left voicemail in regards to Gastric Emptying needing a prior auth. Left message on Melanie voicemail to let her know that prior Berkley Harvey has been completed.

## 2022-10-20 ENCOUNTER — Encounter (HOSPITAL_COMMUNITY)
Admission: RE | Admit: 2022-10-20 | Discharge: 2022-10-20 | Disposition: A | Discharge status: Home / Self Care | Payer: Medicaid Other | Source: Ambulatory Visit | Attending: Gastroenterology | Admitting: Gastroenterology

## 2022-10-20 DIAGNOSIS — R110 Nausea: Secondary | ICD-10-CM | POA: Diagnosis present

## 2022-10-20 MED ORDER — TECHNETIUM TC 99M SULFUR COLLOID
2.0000 | Freq: Once | INTRAVENOUS | Status: AC | PRN
  Administered 2022-10-20: 2.2 via ORAL

## 2022-10-21 ENCOUNTER — Encounter (INDEPENDENT_AMBULATORY_CARE_PROVIDER_SITE_OTHER): Payer: Self-pay | Admitting: Gastroenterology

## 2022-10-26 ENCOUNTER — Encounter: Payer: Self-pay | Admitting: Podiatry

## 2022-10-26 ENCOUNTER — Ambulatory Visit (INDEPENDENT_AMBULATORY_CARE_PROVIDER_SITE_OTHER): Payer: Medicaid Other

## 2022-10-26 ENCOUNTER — Ambulatory Visit (INDEPENDENT_AMBULATORY_CARE_PROVIDER_SITE_OTHER): Payer: Medicaid Other | Admitting: Podiatry

## 2022-10-26 DIAGNOSIS — M722 Plantar fascial fibromatosis: Secondary | ICD-10-CM

## 2022-10-26 NOTE — Progress Notes (Signed)
Subjective:  Patient ID: Destiny Beck, female    DOB: 06-07-1971,  MRN: [id number]  Chief Complaint  Patient presents with   Foot Pain    Bilateral heel pain     52 y.o. female presents with the above complaint.  Patient presents with bilateral heel pain that has been going for quite some time is progressive gotten worse worse with ambulation worse with pressure she would like to discuss treatment options she has not seen MRIs prior to seeing me.  Pain scale 7 out of 10 dull aching nature she is a diabetic  with last A1c of 6.1.  Review of Systems: Negative except as noted in the HPI. Denies N/V/F/Ch.  Past Medical History:  Diagnosis Date   Abscess    Anemia    Anxiety    Arthritis    Chronic back pain    Chronic fatigue    Chronic neck pain    Depression    Diabetes mellitus without complication    Dizziness    Fibromyalgia    GERD (gastroesophageal reflux disease)    Headache    Hypertension    Occipital neuralgia    Sciatica     Current Outpatient Medications:    dexlansoprazole (DEXILANT) 60 MG capsule, Take 1 capsule (60 mg total) by mouth daily., Disp: 90 capsule, Rfl: 3   hydrOXYzine (ATARAX) 50 MG tablet, Take 50 mg by mouth 3 (three) times daily., Disp: , Rfl:    metFORMIN (GLUCOPHAGE) 1000 MG tablet, Take 500 mg by mouth 2 (two) times daily., Disp: , Rfl:    methocarbamol (ROBAXIN) 500 MG tablet, Take 1 tablet (500 mg total) by mouth 3 (three) times daily., Disp: 60 tablet, Rfl: 1   olopatadine (PATANOL) 0.1 % ophthalmic solution, Place 1 drop into both eyes 2 (two) times daily as needed for allergies., Disp: , Rfl:    Omega-3 Fatty Acids (FISH OIL) 1200 MG CAPS, Take 1,200 mg by mouth in the morning and at bedtime., Disp: , Rfl:    prazosin (MINIPRESS) 2 MG capsule, Take 2 mg by mouth at bedtime., Disp: , Rfl:    predniSONE (STERAPRED UNI-PAK 48 TAB) 10 MG (48) TBPK tablet, Take by mouth daily., Disp: 48 tablet, Rfl: 0   promethazine (PHENERGAN) 25 MG  tablet, Take 1 tablet (25 mg total) by mouth every 8 (eight) hours as needed for nausea or vomiting., Disp: 60 tablet, Rfl: 1   rosuvastatin (CRESTOR) 5 MG tablet, Take 5 mg by mouth at bedtime., Disp: , Rfl:    venlafaxine XR (EFFEXOR-XR) 150 MG 24 hr capsule, Take 300 mg by mouth in the morning., Disp: , Rfl:   Social History   Tobacco Use  Smoking Status Former   Packs/day: 0.50   Years: 30.00   Additional pack years: 0.00   Total pack years: 15.00   Types: Cigarettes   Quit date: 01/07/2017   Years since quitting: 5.8  Smokeless Tobacco Never    Allergies  Allergen Reactions   Cymbalta [Duloxetine Hcl] Other (See Comments)    Stroke like symptoms    Objective:  There were no vitals filed for this visit. There is no height or weight on file to calculate BMI. Constitutional Well developed. Well nourished.  Vascular Dorsalis pedis pulses palpable bilaterally. Posterior tibial pulses palpable bilaterally. Capillary refill normal to all digits.  No cyanosis or clubbing noted. Pedal hair growth normal.  Neurologic Normal speech. Oriented to person, place, and time. Epicritic sensation to light touch grossly present  bilaterally.  Dermatologic Nails well groomed and normal in appearance. No open wounds. No skin lesions.  Orthopedic: Normal joint ROM without pain or crepitus bilaterally. No visible deformities. Tender to palpation at the calcaneal tuber bilaterally. No pain with calcaneal squeeze bilaterally. Ankle ROM diminished range with pain bilaterally. Silfverskiold Test: positive bilaterally.   Radiographs: Taken and reviewed. No acute fractures or dislocations. No evidence of stress fracture.  Plantar heel spur absent. Posterior heel spur absent.  Pes planovalgus  Assessment:   1. Plantar fasciitis of right foot   2. Plantar fasciitis of left foot    Plan:  Patient was evaluated and treated and all questions answered.  Plantar Fasciitis, bilaterally - XR  reviewed as above.  - Educated on icing and stretching. Instructions given.  - Injection delivered to the plantar fascia as below. - DME: Plantar fascial brace dispensed to support the medial longitudinal arch of the foot and offload pressure from the heel and prevent arch collapse during weightbearing - Pharmacologic management: None -Power steps insoles were discussed  Procedure: Injection Tendon/Ligament Location: Bilateral plantar fascia at the glabrous junction; medial approach. Skin Prep: alcohol Injectate: 0.5 cc 0.5% marcaine plain, 0.5 cc of 1% Lidocaine, 0.5 cc kenalog 10. Disposition: Patient tolerated procedure well. Injection site dressed with a band-aid.  No follow-ups on file.

## 2022-11-01 ENCOUNTER — Emergency Department (HOSPITAL_COMMUNITY): Payer: Medicaid Other

## 2022-11-01 ENCOUNTER — Emergency Department (HOSPITAL_COMMUNITY)
Admission: EM | Admit: 2022-11-01 | Discharge: 2022-11-01 | Discharge status: Against Medical Advice | Payer: Medicaid Other | Attending: Emergency Medicine | Admitting: Emergency Medicine

## 2022-11-01 ENCOUNTER — Encounter (HOSPITAL_COMMUNITY): Payer: Self-pay

## 2022-11-01 ENCOUNTER — Other Ambulatory Visit: Payer: Self-pay

## 2022-11-01 DIAGNOSIS — Z7984 Long term (current) use of oral hypoglycemic drugs: Secondary | ICD-10-CM | POA: Insufficient documentation

## 2022-11-01 DIAGNOSIS — E119 Type 2 diabetes mellitus without complications: Secondary | ICD-10-CM | POA: Insufficient documentation

## 2022-11-01 DIAGNOSIS — R1013 Epigastric pain: Secondary | ICD-10-CM | POA: Diagnosis not present

## 2022-11-01 DIAGNOSIS — I10 Essential (primary) hypertension: Secondary | ICD-10-CM | POA: Insufficient documentation

## 2022-11-01 DIAGNOSIS — Z79899 Other long term (current) drug therapy: Secondary | ICD-10-CM | POA: Diagnosis not present

## 2022-11-01 DIAGNOSIS — R110 Nausea: Secondary | ICD-10-CM | POA: Diagnosis present

## 2022-11-01 LAB — URINALYSIS, ROUTINE W REFLEX MICROSCOPIC
Bacteria, UA: NONE SEEN
Bilirubin Urine: NEGATIVE
Glucose, UA: NEGATIVE mg/dL
Ketones, ur: NEGATIVE mg/dL
Leukocytes,Ua: NEGATIVE
Nitrite: NEGATIVE
Protein, ur: 30 mg/dL — AB
RBC / HPF: >50 RBC/hpf (ref 0–5)
Specific Gravity, Urine: 1.021 (ref 1.005–1.030)
pH: 5 (ref 5.0–8.0)

## 2022-11-01 LAB — COMPREHENSIVE METABOLIC PANEL
ALT: 22 U/L (ref 0–44)
AST: 25 U/L (ref 15–41)
Albumin: 4.3 g/dL (ref 3.5–5.0)
Alkaline Phosphatase: 69 U/L (ref 38–126)
Anion gap: 11 (ref 5–15)
BUN: 12 mg/dL (ref 6–20)
CO2: 27 mmol/L (ref 22–32)
Calcium: 8.9 mg/dL (ref 8.9–10.3)
Chloride: 99 mmol/L (ref 98–111)
Creatinine, Ser: 0.93 mg/dL (ref 0.44–1.00)
GFR, Estimated: >60 mL/min (ref >60)
Glucose, Bld: 153 mg/dL — ABNORMAL HIGH (ref 70–99)
Potassium: 4 mmol/L (ref 3.5–5.1)
Sodium: 137 mmol/L (ref 135–145)
Total Bilirubin: 0.6 mg/dL (ref 0.3–1.2)
Total Protein: 7.7 g/dL (ref 6.5–8.1)

## 2022-11-01 LAB — CBC
HCT: 47.6 % — ABNORMAL HIGH (ref 36.0–46.0)
Hemoglobin: 15.8 g/dL — ABNORMAL HIGH (ref 12.0–15.0)
MCH: 31.3 pg (ref 26.0–34.0)
MCHC: 33.2 g/dL (ref 30.0–36.0)
MCV: 94.4 fL (ref 80.0–100.0)
Platelets: 254 10*3/uL (ref 150–400)
RBC: 5.04 MIL/uL (ref 3.87–5.11)
RDW: 13.2 % (ref 11.5–15.5)
WBC: 5.1 10*3/uL (ref 4.0–10.5)
nRBC: 0 % (ref 0.0–0.2)

## 2022-11-01 LAB — LIPASE, BLOOD: Lipase: 37 U/L (ref 11–51)

## 2022-11-01 MED ORDER — IOHEXOL 300 MG/ML  SOLN: 100.0000 mL | Freq: Once | INTRAMUSCULAR | Status: DC | PRN

## 2022-11-01 MED ORDER — LORAZEPAM 1 MG PO TABS: 1.0000 mg | ORAL_TABLET | Freq: Once | ORAL | Status: DC

## 2022-11-01 MED ORDER — SODIUM CHLORIDE 0.9 % IV BOLUS
1000.0000 mL | Freq: Once | INTRAVENOUS | Status: AC
  Administered 2022-11-01: 1000 mL via INTRAVENOUS

## 2022-11-01 MED ORDER — METOCLOPRAMIDE HCL 5 MG/ML IJ SOLN
10.0000 mg | Freq: Once | INTRAMUSCULAR | Status: AC
  Administered 2022-11-01: 10 mg via INTRAVENOUS
  Filled 2022-11-01: qty 2

## 2022-11-01 NOTE — ED Triage Notes (Signed)
Pt reports she has been diagnosed with gastroparesis and can not get a GI appointment until May14th and she can not handle the nausea until then.  Reports her PCP only offered reglan and she is concerned about the side effects of that medication.

## 2022-11-01 NOTE — ED Provider Notes (Signed)
[place] EMERGENCY DEPARTMENT AT [facility] Provider Note   CSN: [id number] Arrival date & time: 11/01/22  1308     History  Chief Complaint  Patient presents with   Nausea    Destiny Beck is a 52 y.o. female.  HPI     Destiny Beck is a 52 y.o. female with past medical history of chronic pain disorder, chronic fatigue, fibromyalgia, type 2 diabetes, GERD, anxiety, and hypertension who presents to the Emergency Department complaining of chronic nausea that has been persistent for some time, states 1 to 2 weeks ago nausea became worse and more consistent and associated with upper abdominal pain.  She has been followed by her GI provider for this and was diagnosed with gastroparesis 1 week ago.  She was initially taking Zofran without relief.  Takes promethazine with some relief.  Was offered prescription for Reglan but states she is concerned about the side effects.  Has upcoming GI appointment in May, but here tonight due to increasing nausea and intermittent episodes of vomiting and states she cannot wait until her GI appointment.  She denies fever, chills, chest pain, shortness of breath.  Home Medications Prior to Admission medications   Medication Sig Start Date End Date Taking? Authorizing Provider  dexlansoprazole (DEXILANT) 60 MG capsule Take 1 capsule (60 mg total) by mouth daily. 10/14/22   Dolores Frame, MD  hydrOXYzine (ATARAX) 50 MG tablet Take 50 mg by mouth 3 (three) times daily. 06/02/21   [provider]  metFORMIN (GLUCOPHAGE) 1000 MG tablet Take 500 mg by mouth 2 (two) times daily. 06/17/21   [provider]  methocarbamol (ROBAXIN) 500 MG tablet Take 1 tablet (500 mg total) by mouth 3 (three) times daily. 10/14/22   Vickki Hearing, MD  olopatadine (PATANOL) 0.1 % ophthalmic solution Place 1 drop into both eyes 2 (two) times daily as needed for allergies.    [provider]  Omega-3 Fatty Acids (FISH OIL)  1200 MG CAPS Take 1,200 mg by mouth in the morning and at bedtime.    [provider]  prazosin (MINIPRESS) 2 MG capsule Take 2 mg by mouth at bedtime. 10/30/19   [provider]  predniSONE (STERAPRED UNI-PAK 48 TAB) 10 MG (48) TBPK tablet Take by mouth daily. 10/14/22   Vickki Hearing, MD  promethazine (PHENERGAN) 25 MG tablet Take 1 tablet (25 mg total) by mouth every 8 (eight) hours as needed for nausea or vomiting. 10/14/22   Dolores Frame, MD  rosuvastatin (CRESTOR) 5 MG tablet Take 5 mg by mouth at bedtime. 10/14/21   [provider]  venlafaxine XR (EFFEXOR-XR) 150 MG 24 hr capsule Take 300 mg by mouth in the morning. 07/15/21   [provider]      Allergies    Cymbalta [duloxetine hcl]    Review of Systems   Review of Systems  Constitutional:  Negative for chills and fever.  Respiratory:  Negative for shortness of breath.   Cardiovascular:  Negative for chest pain.  Gastrointestinal:  Positive for abdominal pain, nausea and vomiting. Negative for constipation and diarrhea.  Genitourinary:  Negative for dysuria.  Musculoskeletal:  Negative for arthralgias and myalgias.  Skin:  Negative for rash.  Neurological:  Negative for seizures, weakness and headaches.    Physical Exam Updated Vital Signs BP 111/78   Pulse 75   Temp 99.7 F (37.6 C) (Oral)   Resp 14   Ht 5' (1.524 m)  Wt 71.7 kg   SpO2 97%   BMI 30.86 kg/m  Physical Exam Vitals and nursing note reviewed.  Constitutional:      General: She is not in acute distress.    Appearance: Normal appearance. She is not ill-appearing or toxic-appearing.  HENT:     Mouth/Throat:     Mouth: Mucous membranes are moist.  Cardiovascular:     Rate and Rhythm: Normal rate and regular rhythm.     Pulses: Normal pulses.  Pulmonary:     Effort: Pulmonary effort is normal.  Abdominal:     General: There is no distension.     Palpations: Abdomen is soft.     Tenderness: There is  abdominal tenderness.     Comments: Mild tenderness palpation epigastric area.  No guarding or rebound.  Abdomen is soft  Musculoskeletal:        General: Normal range of motion.  Skin:    General: Skin is warm.     Capillary Refill: Capillary refill takes less than 2 seconds.  Neurological:     General: No focal deficit present.     Mental Status: She is alert.     Sensory: No sensory deficit.     Motor: No weakness.     ED Results / Procedures / Treatments   Labs (all labs ordered are listed, but only abnormal results are displayed) Labs Reviewed  COMPREHENSIVE METABOLIC PANEL - Abnormal; Notable for the following components:      Result Value   Glucose, Bld 153 (*)    All other components within normal limits  CBC - Abnormal; Notable for the following components:   Hemoglobin 15.8 (*)    HCT 47.6 (*)    All other components within normal limits  URINALYSIS, ROUTINE W REFLEX MICROSCOPIC - Abnormal; Notable for the following components:   APPearance HAZY (*)    Hgb urine dipstick LARGE (*)    Protein, ur 30 (*)    All other components within normal limits  LIPASE, BLOOD  PREGNANCY, URINE    EKG None  Radiology No results found.  Procedures Procedures    Medications Ordered in ED Medications  LORazepam (ATIVAN) tablet 1 mg (1 mg Oral Not Given 11/01/22 2127)  iohexol (OMNIPAQUE) 300 MG/ML solution 100 mL (has no administration in time range)  sodium chloride 0.9 % bolus 1,000 mL (0 mLs Intravenous Stopped 11/01/22 2129)  metoCLOPramide (REGLAN) injection 10 mg (10 mg Intravenous Given 11/01/22 2034)    ED Course/ Medical Decision Making/ A&P                             Medical Decision Making Upper abdominal pain with chronic nausea, gradually worsening for 1 to 2 weeks now having intermittent vomiting as well.  Symptoms unrelieved with Zofran and Phenergan.  No fever or chills.  Denies diarrhea.   Differential would include but not limited to diabetic  gastroparesis, cyclic vomiting syndrome, viral syndrome, GERD, peptic ulcer disease  Amount and/or Complexity of Data Reviewed Labs: ordered.    Details: Labs interpreted by me, no evidence of leukocytosis, lipase unremarkable, chemistries also unremarkable, urinalysis with large blood, proteinuria, 11-20 white cells with no bacteria Discussion of management or test interpretation with external provider(s): Since patient has upper abdominal pain and vomiting.  CT scan of the abdomen pelvis was ordered for further evaluation.  After long discussion regarding treatment plan, patient agreeable to IV fluids and she would like  to try IV Reglan.  Patient reassured.  No clinical concern for sepsis.  Shortly after medication was given, I was notified by nursing staff that patient had an episode of diarrhea and became anxious and patient requested antianxiety medication.  She then told nursing staff that she had an emergency at home and needed to leave right away.  She had not yet received her CT scan, patient was signed out AMA  Risk Prescription drug management.           Final Clinical Impression(s) / ED Diagnoses Final diagnoses:  Nausea    Rx / DC Orders ED Discharge Orders     None         Rosey Bath 11/01/22 2310    Bethann Berkshire, MD 11/03/22 1301

## 2022-11-02 ENCOUNTER — Other Ambulatory Visit (INDEPENDENT_AMBULATORY_CARE_PROVIDER_SITE_OTHER): Payer: Self-pay | Admitting: Gastroenterology

## 2022-11-02 DIAGNOSIS — K3184 Gastroparesis: Secondary | ICD-10-CM

## 2022-11-02 MED ORDER — METOCLOPRAMIDE HCL 5 MG PO TABS
5.0000 mg | ORAL_TABLET | Freq: Three times a day (TID) | ORAL | 1 refills | Status: AC
Start: 2022-11-02 — End: ?

## 2022-11-02 NOTE — Progress Notes (Signed)
Wrong encounter. 

## 2022-11-02 NOTE — Addendum Note (Signed)
Addended by: Vickki Hearing on: 11/02/2022 08:46 AM   Modules accepted: Level of Service

## 2022-11-09 ENCOUNTER — Ambulatory Visit (HOSPITAL_COMMUNITY)
Admission: RE | Admit: 2022-11-09 | Discharge: 2022-11-09 | Disposition: A | Discharge status: Home / Self Care | Payer: Medicaid Other | Source: Ambulatory Visit | Attending: Orthopedic Surgery | Admitting: Orthopedic Surgery

## 2022-11-09 DIAGNOSIS — G8929 Other chronic pain: Secondary | ICD-10-CM

## 2022-11-09 DIAGNOSIS — M25511 Pain in right shoulder: Secondary | ICD-10-CM | POA: Insufficient documentation

## 2022-11-11 ENCOUNTER — Encounter: Payer: Self-pay | Admitting: Orthopedic Surgery

## 2022-11-22 ENCOUNTER — Ambulatory Visit: Payer: Medicaid Other | Admitting: Orthopedic Surgery

## 2022-11-22 ENCOUNTER — Telehealth: Payer: Self-pay | Admitting: Radiology

## 2022-11-22 DIAGNOSIS — G8929 Other chronic pain: Secondary | ICD-10-CM

## 2022-11-22 DIAGNOSIS — S43431A Superior glenoid labrum lesion of right shoulder, initial encounter: Secondary | ICD-10-CM | POA: Diagnosis not present

## 2022-11-22 DIAGNOSIS — M7551 Bursitis of right shoulder: Secondary | ICD-10-CM | POA: Diagnosis not present

## 2022-11-22 NOTE — Telephone Encounter (Signed)
Call to schedule intra articular injection of right shoulder at [facility]

## 2022-11-22 NOTE — Progress Notes (Signed)
   This is a follow-up appointment  Encounter Diagnoses  Name Primary?   Chronic right shoulder pain Yes   Bursitis of right shoulder    Nontraumatic incomplete tear of right rotator cuff      Chief Complaint  Patient presents with   Shoulder Pain    Right    Results    Review MRI scan right shoulder     I reviewed the MRI scan in my opinion there is no rotator cuff tear there appears to be a nondisplaced superior posterior labral tear  The patient has had 2 injections in the subacromial space she has had anti-inflammatory medication  She is currently complaining of pain with range of motion of the shoulder  Review of systems patient with recent onset gastroparesis caused her to lose 8 pounds.  She is now on Reglan which is stabilized her symptoms but she still not completely under control.  Consult scheduled at [facility] but not until September  I advised against surgical intervention for this injury.  This is a small labral tear probably needs debridement versus repair anyway  Treatment plan  Encounter Diagnoses  Name Primary?   Chronic right shoulder pain Yes   Bursitis of right shoulder        Tear of right glenoid labrum, initial encounter      Surgical versus intra-articular injection was discussed  Recommend intra-articular injection right shoulder follow-up in 6 weeks

## 2022-11-23 ENCOUNTER — Telehealth: Payer: Self-pay | Admitting: Orthopedic Surgery

## 2022-11-23 ENCOUNTER — Ambulatory Visit (INDEPENDENT_AMBULATORY_CARE_PROVIDER_SITE_OTHER): Payer: Medicaid Other | Admitting: Gastroenterology

## 2022-11-23 ENCOUNTER — Encounter (INDEPENDENT_AMBULATORY_CARE_PROVIDER_SITE_OTHER): Payer: Self-pay | Admitting: Gastroenterology

## 2022-11-23 NOTE — Telephone Encounter (Signed)
Dr. Mort Sawyers pt - pt lvm for Amy to confirm she can do the injection at 10:30am Friday morning.

## 2022-11-23 NOTE — Telephone Encounter (Signed)
Dr. Harrison's pt - pt lvm for Destiny Beck to confirm she can do the injection at 10:30am Friday morning. 

## 2022-11-23 NOTE — Telephone Encounter (Signed)
Called to schedule / Friday arrive at 1030 for 11 am injection I called patient to advise left message for her to call back and let me know if that time and day works if not will give her scheduling number and let her RS [phone]

## 2022-11-24 ENCOUNTER — Ambulatory Visit (INDEPENDENT_AMBULATORY_CARE_PROVIDER_SITE_OTHER): Payer: Medicaid Other | Admitting: Podiatry

## 2022-11-24 DIAGNOSIS — M62469 Contracture of muscle, unspecified lower leg: Secondary | ICD-10-CM | POA: Diagnosis not present

## 2022-11-24 DIAGNOSIS — M722 Plantar fascial fibromatosis: Secondary | ICD-10-CM | POA: Diagnosis not present

## 2022-11-24 NOTE — Progress Notes (Signed)
Subjective:  Patient ID: Destiny Beck, female    DOB: Jan 11, 1971,  MRN: [id number]  Chief Complaint  Patient presents with   Foot Pain    Patient states heel pain is getting better but there can still be some improvement.    52 y.o. female presents with the above complaint.  Patient presents for follow-up of bilateral Planter fasciitis.  She states the injection helped some she still has some residual pain.  She would like to discuss next treatment plan  Review of Systems: Negative except as noted in the HPI. Denies N/V/F/Ch.  Past Medical History:  Diagnosis Date   Abscess    Anemia    Anxiety    Arthritis    Chronic back pain    Chronic fatigue    Chronic neck pain    Depression    Diabetes mellitus without complication (HCC)    Dizziness    Fibromyalgia    GERD (gastroesophageal reflux disease)    Headache    Hypertension    Occipital neuralgia    Sciatica     Current Outpatient Medications:    dexlansoprazole (DEXILANT) 60 MG capsule, Take 1 capsule (60 mg total) by mouth daily., Disp: 90 capsule, Rfl: 3   hydrOXYzine (ATARAX) 50 MG tablet, Take 50 mg by mouth 3 (three) times daily., Disp: , Rfl:    metFORMIN (GLUCOPHAGE) 1000 MG tablet, Take 500 mg by mouth 2 (two) times daily., Disp: , Rfl:    methocarbamol (ROBAXIN) 500 MG tablet, Take 1 tablet (500 mg total) by mouth 3 (three) times daily., Disp: 60 tablet, Rfl: 1   metoCLOPramide (REGLAN) 5 MG tablet, Take 1 tablet (5 mg total) by mouth 3 (three) times daily before meals., Disp: 210 tablet, Rfl: 1   olopatadine (PATANOL) 0.1 % ophthalmic solution, Place 1 drop into both eyes 2 (two) times daily as needed for allergies., Disp: , Rfl:    Omega-3 Fatty Acids (FISH OIL) 1200 MG CAPS, Take 1,200 mg by mouth in the morning and at bedtime., Disp: , Rfl:    prazosin (MINIPRESS) 2 MG capsule, Take 2 mg by mouth at bedtime., Disp: , Rfl:    predniSONE (STERAPRED UNI-PAK 48 TAB) 10 MG (48) TBPK tablet, Take by mouth  daily., Disp: 48 tablet, Rfl: 0   venlafaxine XR (EFFEXOR-XR) 150 MG 24 hr capsule, Take 300 mg by mouth in the morning., Disp: , Rfl:   Social History   Tobacco Use  Smoking Status Former   Packs/day: 0.50   Years: 30.00   Additional pack years: 0.00   Total pack years: 15.00   Types: Cigarettes   Quit date: 01/07/2017   Years since quitting: 5.9  Smokeless Tobacco Never    Allergies  Allergen Reactions   Cymbalta [Duloxetine Hcl] Other (See Comments)    Stroke like symptoms    Objective:  There were no vitals filed for this visit. There is no height or weight on file to calculate BMI. Constitutional Well developed. Well nourished.  Vascular Dorsalis pedis pulses palpable bilaterally. Posterior tibial pulses palpable bilaterally. Capillary refill normal to all digits.  No cyanosis or clubbing noted. Pedal hair growth normal.  Neurologic Normal speech. Oriented to person, place, and time. Epicritic sensation to light touch grossly present bilaterally.  Dermatologic Nails well groomed and normal in appearance. No open wounds. No skin lesions.  Orthopedic: Normal joint ROM without pain or crepitus bilaterally. No visible deformities. Tender to palpation at the calcaneal tuber bilaterally. No pain with calcaneal  squeeze bilaterally. Ankle ROM diminished range with pain bilaterally. Silfverskiold Test: positive bilaterally.   Radiographs: Taken and reviewed. No acute fractures or dislocations. No evidence of stress fracture.  Plantar heel spur absent. Posterior heel spur absent.  Pes planovalgus  Assessment:   1. Plantar fasciitis of right foot   2. Plantar fasciitis of left foot   3. Gastrocnemius equinus, unspecified laterality     Plan:  Patient was evaluated and treated and all questions answered.  Plantar Fasciitis, bilaterally with underlying gastrocnemius equinus - XR reviewed as above.  - Educated on icing and stretching. Instructions given.  -Second  injection delivered to the plantar fascia as below. - DME: Plantar fascial brace dispensed to support the medial longitudinal arch of the foot and offload pressure from the heel and prevent arch collapse during weightbearing - Pharmacologic management: None -Power steps insoles were discussed  Procedure: Injection Tendon/Ligament Location: Bilateral plantar fascia at the glabrous junction; medial approach. Skin Prep: alcohol Injectate: 0.5 cc 0.5% marcaine plain, 0.5 cc of 1% Lidocaine, 0.5 cc kenalog 10. Disposition: Patient tolerated procedure well. Injection site dressed with a band-aid.  No follow-ups on file.

## 2022-11-26 ENCOUNTER — Ambulatory Visit (HOSPITAL_COMMUNITY)
Admission: RE | Admit: 2022-11-26 | Discharge: 2022-11-26 | Disposition: A | Discharge status: Home / Self Care | Payer: Medicaid Other | Source: Ambulatory Visit | Attending: Orthopedic Surgery | Admitting: Orthopedic Surgery

## 2022-11-26 DIAGNOSIS — M7551 Bursitis of right shoulder: Secondary | ICD-10-CM | POA: Insufficient documentation

## 2022-11-26 DIAGNOSIS — M25511 Pain in right shoulder: Secondary | ICD-10-CM | POA: Insufficient documentation

## 2022-11-26 DIAGNOSIS — G8929 Other chronic pain: Secondary | ICD-10-CM | POA: Insufficient documentation

## 2022-11-26 MED ORDER — METHYLPREDNISOLONE ACETATE 40 MG/ML IJ SUSP
40.0000 mg | Freq: Once | INTRAMUSCULAR | Status: AC
  Administered 2022-11-26: 40 mg via INTRA_ARTICULAR

## 2022-11-26 MED ORDER — STERILE WATER FOR INJECTION IJ SOLN
INTRAMUSCULAR | Status: AC
  Filled 2022-11-26: qty 10

## 2022-11-26 MED ORDER — IOHEXOL 180 MG/ML  SOLN
10.0000 mL | Freq: Once | INTRAMUSCULAR | Status: AC
  Administered 2022-11-26: 10 mL via INTRA_ARTICULAR

## 2022-11-26 MED ORDER — LIDOCAINE HCL (PF) 1 % IJ SOLN
5.0000 mL | Freq: Once | INTRAMUSCULAR | Status: AC
  Administered 2022-11-26: 5 mL via INTRADERMAL

## 2022-11-26 MED ORDER — STERILE WATER FOR INJECTION IJ SOLN
5.0000 mL | Freq: Once | INTRAMUSCULAR | Status: AC
  Administered 2022-11-26: 5 mL via INTRAMUSCULAR

## 2022-11-26 MED ORDER — BUPIVACAINE HCL (PF) 0.5 % IJ SOLN
INTRAMUSCULAR | Status: AC
  Filled 2022-11-26: qty 30

## 2022-11-26 MED ORDER — BUPIVACAINE HCL (PF) 0.5 % IJ SOLN
10.0000 mL | Freq: Once | INTRAMUSCULAR | Status: AC
  Administered 2022-11-26: 5 mL

## 2022-11-26 NOTE — Procedures (Signed)
Radiology Procedure Note  Risks and benefits of joint injection were discussed with the patient including, but not limited to bleeding, infection, damage to adjacent structures, allergic reaction, and extraarticular contrast injection.    All of the patient's questions were answered, patient is agreeable to proceed. Consent signed and in chart.  A timeout was performed with all members of the team prior to start of the procedure. Correct patient and correct procedure was confirmed. Allergies were reviewed.   PROCEDURE SUMMARY:  Successful fluoro guided right shoulder steroid injection. No immediate complications.  Patient tolerated well.   EBL = trace  Please see full dictation in imaging section of Epic for procedure details.   Kennieth Francois PA-C 11/26/2022 12:20 PM

## 2022-12-02 ENCOUNTER — Encounter: Payer: Self-pay | Admitting: Podiatry

## 2022-12-05 ENCOUNTER — Encounter (INDEPENDENT_AMBULATORY_CARE_PROVIDER_SITE_OTHER): Payer: Self-pay | Admitting: Gastroenterology

## 2023-02-09 ENCOUNTER — Other Ambulatory Visit: Payer: Self-pay | Admitting: Orthopedic Surgery

## 2023-02-09 DIAGNOSIS — G8929 Other chronic pain: Secondary | ICD-10-CM

## 2023-02-09 DIAGNOSIS — M75111 Incomplete rotator cuff tear or rupture of right shoulder, not specified as traumatic: Secondary | ICD-10-CM

## 2023-02-09 DIAGNOSIS — M7551 Bursitis of right shoulder: Secondary | ICD-10-CM

## 2023-02-16 DIAGNOSIS — M533 Sacrococcygeal disorders, not elsewhere classified: Secondary | ICD-10-CM | POA: Insufficient documentation

## 2023-03-07 ENCOUNTER — Encounter: Payer: Self-pay | Admitting: Orthopedic Surgery

## 2023-03-15 DIAGNOSIS — K3184 Gastroparesis: Secondary | ICD-10-CM | POA: Insufficient documentation

## 2023-03-16 ENCOUNTER — Ambulatory Visit: Payer: Medicaid Other | Admitting: Orthopedic Surgery

## 2023-03-25 ENCOUNTER — Ambulatory Visit: Payer: Medicaid Other | Admitting: Orthopedic Surgery

## 2023-04-18 ENCOUNTER — Ambulatory Visit (INDEPENDENT_AMBULATORY_CARE_PROVIDER_SITE_OTHER): Payer: Medicaid Other | Admitting: Gastroenterology

## 2023-06-29 ENCOUNTER — Encounter (INDEPENDENT_AMBULATORY_CARE_PROVIDER_SITE_OTHER): Payer: Self-pay | Admitting: *Deleted

## 2023-07-21 ENCOUNTER — Telehealth (INDEPENDENT_AMBULATORY_CARE_PROVIDER_SITE_OTHER): Payer: Self-pay | Admitting: Gastroenterology

## 2023-07-21 NOTE — Telephone Encounter (Signed)
 Who is your primary care physician: [facility]  Reasons for the colonoscopy: 5 year recall  Have you had a colonoscopy before?  yes  Do you have family history of colon cancer? Yes several members on my dads side (2 aunts and may cousins)  Previous colonoscopy with polyps removed? no  Do you have a history colorectal cancer?   no  Are you diabetic? If yes, Type 1 or Type 2?    Yes Type 2  Do you have a prosthetic or mechanical heart valve? no  Do you have a pacemaker/defibrillator?   no  Have you had endocarditis/atrial fibrillation? no  Have you had joint replacement within the last 12 months?  no  Do you tend to be constipated or have to use laxatives? yes  Do you have any history of drugs or alchohol?  no  Do you use supplemental oxygen?  no  Have you had a stroke or heart attack within the last 6 months? no  Do you take weight loss medication?  no  For female patients: have you had a hysterectomy?  Uterine ablation                                     are you post menopausal?       yes                                            do you still have your menstrual cycle?       Do you take any blood-thinning medications such as: (aspirin, warfarin, Plavix, Aggrenox)  no  If yes we need the name, milligram, dosage and who is prescribing doctor  Current Outpatient Medications on File Prior to Visit  Medication Sig Dispense Refill   dexlansoprazole  (DEXILANT ) 60 MG capsule Take 1 capsule (60 mg total) by mouth daily. 90 capsule 3   metFORMIN (GLUCOPHAGE) 1000 MG tablet Take 500 mg by mouth 2 (two) times daily.     ondansetron  (ZOFRAN -ODT) 8 MG disintegrating tablet SMARTSIG:1 Tablet(s) By Mouth Every 12 Hours PRN     prazosin (MINIPRESS) 2 MG capsule Take 2 mg by mouth at bedtime.     CAPLYTA 21 MG CAPS Take 1 capsule by mouth at bedtime.     divalproex (DEPAKOTE) 500 MG DR tablet Take 500 mg by mouth 2 (two) times daily.     hydrOXYzine  (ATARAX ) 50 MG tablet Take 50  mg by mouth 3 (three) times daily. (Patient not taking: Reported on 07/21/2023)     methocarbamol  (ROBAXIN ) 500 MG tablet TAKE ONE TABLET BY MOUTH THREE TIMES DAILY (Patient not taking: Reported on 07/21/2023) 60 tablet 1   metoCLOPramide  (REGLAN ) 5 MG tablet Take 1 tablet (5 mg total) by mouth 3 (three) times daily before meals. (Patient not taking: Reported on 07/21/2023) 210 tablet 1   olopatadine (PATANOL) 0.1 % ophthalmic solution Place 1 drop into both eyes 2 (two) times daily as needed for allergies. (Patient not taking: Reported on 07/21/2023)     Omega-3 Fatty Acids (FISH OIL) 1200 MG CAPS Take 1,200 mg by mouth in the morning and at bedtime. (Patient not taking: Reported on 07/21/2023)     predniSONE  (STERAPRED UNI-PAK 48 TAB) 10 MG (48) TBPK tablet Take by mouth daily. (Patient not taking: Reported on 07/21/2023)  48 tablet 0   sertraline (ZOLOFT) 100 MG tablet Take 200 mg by mouth daily.     venlafaxine XR (EFFEXOR-XR) 150 MG 24 hr capsule Take 300 mg by mouth in the morning. (Patient not taking: Reported on 07/21/2023)     No current facility-administered medications on file prior to visit.    Allergies  Allergen Reactions   Cymbalta [Duloxetine Hcl] Other (See Comments)    Stroke like symptoms      Pharmacy: [facility]  Primary Insurance Name: Medicaid  Best number where you can be reached: [id number]

## 2023-07-21 NOTE — Telephone Encounter (Signed)
 Room 1 Thanks

## 2023-07-27 NOTE — Progress Notes (Deleted)
  Intake history:  There were no vitals taken for this visit. There is no height or weight on file to calculate BMI.    WHAT ARE WE SEEING YOU FOR TODAY?   bilateral hand(s)  How long has this bothered you? (DOI?DOS?WS?)  on ***  Anticoag.  No  Diabetes Yes  Heart disease No  Hypertension {yes/no:20286}  SMOKING HX former  Kidney disease No  Any ALLERGIES ______________________________________________   Treatment:  Have you taken:  Tylenol {yes/no:20286}  Advil {yes/no:20286}  Had PT {yes/no:20286}  Had injection {yes/no:20286}  Other  _________________________

## 2023-07-28 ENCOUNTER — Encounter: Payer: Medicaid Other | Admitting: Orthopedic Surgery

## 2023-07-29 MED ORDER — PEG 3350-KCL-NA BICARB-NACL 420 G PO SOLR: 4000.0000 mL | Freq: Once | ORAL | 0 refills | Status: AC

## 2023-07-29 NOTE — Telephone Encounter (Signed)
Pt contacted. Pt scheduled. Prep sent to pharmacy. Instructions sent via my chart. PA pending via [facility]  Thank you for your online prior authorization/notification submission.  The prior authorization/notification reference number is: [id number]. The prior authorization/notification case information was transmitted on 07/29/2023 at 09:19 AM CST . Please print this page for your records.  The reference number confirms receipt of your request. Refer to it for future inquiries. Coverage and payment of services are governed by the member's benefit plan and, if applicable, the provider's health plan participation.  Please note that if you wish to cancel these services at any time, or if you have any questions, please contact us by calling the number on the back of the member's ID card. Thank you.  Notification is not a verification, guarantee of benefits, or clinical determination. Payment of services is based on your participation agreement with [place] and the enrollee's benefit plan at the time services are provided.  A Notification may be considered late if not submitted within one business day after the date of admission or submitted per your participation agreement. Please reference your agreement for further information in this regard.

## 2023-07-29 NOTE — Addendum Note (Signed)
Addended by: Marlowe Shores on: 07/29/2023 10:20 AM   Modules accepted: Orders

## 2023-07-29 NOTE — Telephone Encounter (Signed)
After booking case, message from [facility] stating this is an 8:30 start day. Will re do instructions and make pt aware.

## 2023-08-02 NOTE — Telephone Encounter (Signed)
Questionnaire from recall, no referral needed  

## 2023-08-05 DIAGNOSIS — F17211 Nicotine dependence, cigarettes, in remission: Secondary | ICD-10-CM | POA: Insufficient documentation

## 2023-08-05 DIAGNOSIS — F603 Borderline personality disorder: Secondary | ICD-10-CM | POA: Insufficient documentation

## 2023-09-06 NOTE — Anesthesia Preprocedure Evaluation (Signed)
 Anesthesia Evaluation  Patient identified by MRN, date of birth, ID band Patient awake    Reviewed: Allergy & Precautions, H&P , NPO status , Patient's Chart, lab work & pertinent test results, reviewed documented beta blocker date and time   Airway Mallampati: II  TM Distance: >3 FB Neck ROM: full    Dental no notable dental hx.    Pulmonary neg pulmonary ROS, former smoker   Pulmonary exam normal breath sounds clear to auscultation       Cardiovascular Exercise Tolerance: Good hypertension,  Rhythm:regular Rate:Normal     Neuro/Psych  Headaches PSYCHIATRIC DISORDERS Anxiety Depression Bipolar Disorder    Neuromuscular disease    GI/Hepatic Neg liver ROS,GERD  Medicated,,  Endo/Other  negative endocrine ROSdiabetes    Renal/GU negative Renal ROS  negative genitourinary   Musculoskeletal  (+) Arthritis , Osteoarthritis,  Fibromyalgia -  Abdominal   Peds  Hematology  (+) Blood dyscrasia, anemia   Anesthesia Other Findings   Reproductive/Obstetrics negative OB ROS                             Anesthesia Physical Anesthesia Plan  ASA: 2  Anesthesia Plan: General   Post-op Pain Management: Minimal or no pain anticipated   Induction: Intravenous  PONV Risk Score and Plan: Propofol infusion  Airway Management Planned: Natural Airway and Nasal Cannula  Additional Equipment: None  Intra-op Plan:   Post-operative Plan:   Informed Consent: I have reviewed the patients History and Physical, chart, labs and discussed the procedure including the risks, benefits and alternatives for the proposed anesthesia with the patient or authorized representative who has indicated his/her understanding and acceptance.     Dental Advisory Given  Plan Discussed with: CRNA  Anesthesia Plan Comments:         Anesthesia Quick Evaluation

## 2023-09-07 ENCOUNTER — Encounter (HOSPITAL_COMMUNITY): Admission: RE | Payer: Self-pay | Source: Home / Self Care

## 2023-09-07 ENCOUNTER — Ambulatory Visit (HOSPITAL_COMMUNITY): Admission: RE | Admit: 2023-09-07 | Payer: Medicaid Other | Source: Home / Self Care | Admitting: Gastroenterology

## 2023-09-07 ENCOUNTER — Encounter (HOSPITAL_COMMUNITY): Payer: Self-pay | Admitting: Anesthesiology

## 2023-09-07 ENCOUNTER — Telehealth (INDEPENDENT_AMBULATORY_CARE_PROVIDER_SITE_OTHER): Payer: Self-pay | Admitting: Gastroenterology

## 2023-09-07 DIAGNOSIS — E119 Type 2 diabetes mellitus without complications: Secondary | ICD-10-CM

## 2023-09-07 SURGERY — COLONOSCOPY WITH PROPOFOL
Anesthesia: Choice

## 2023-09-07 NOTE — Telephone Encounter (Signed)
 Carlean Purl, RN  Marlowe Shores, LPN No show for procedure this morning.  TCS ASA 1

## 2023-10-07 ENCOUNTER — Other Ambulatory Visit: Payer: Self-pay | Admitting: Orthopedic Surgery

## 2023-10-07 DIAGNOSIS — M75111 Incomplete rotator cuff tear or rupture of right shoulder, not specified as traumatic: Secondary | ICD-10-CM

## 2023-10-07 DIAGNOSIS — G8929 Other chronic pain: Secondary | ICD-10-CM

## 2023-10-07 DIAGNOSIS — M7551 Bursitis of right shoulder: Secondary | ICD-10-CM

## 2023-11-04 ENCOUNTER — Other Ambulatory Visit (INDEPENDENT_AMBULATORY_CARE_PROVIDER_SITE_OTHER): Payer: Self-pay | Admitting: Gastroenterology

## 2023-11-04 DIAGNOSIS — K2100 Gastro-esophageal reflux disease with esophagitis, without bleeding: Secondary | ICD-10-CM

## 2024-02-10 ENCOUNTER — Encounter: Payer: Self-pay | Admitting: Orthopedic Surgery

## 2024-02-10 ENCOUNTER — Ambulatory Visit (INDEPENDENT_AMBULATORY_CARE_PROVIDER_SITE_OTHER): Admitting: Orthopedic Surgery

## 2024-02-10 DIAGNOSIS — M7521 Bicipital tendinitis, right shoulder: Secondary | ICD-10-CM

## 2024-02-10 MED ORDER — METHYLPREDNISOLONE ACETATE 40 MG/ML IJ SUSP
40.0000 mg | Freq: Once | INTRAMUSCULAR | Status: AC
  Administered 2024-02-10: 40 mg via INTRA_ARTICULAR

## 2024-02-10 NOTE — Progress Notes (Signed)
 Chief Complaint  Patient presents with   Shoulder Pain    Right wants injection    53 year old female with right shoulder pain which is now chronic.  She has been followed since January 2024 she had an MRI there was no evidence of a tear in the rotator cuff.  She had intra-articular injection 2 subacromial injections.  She was considering surgical intervention but she has a severe gastroparesis and is at risk for aspiration.  Her surgery has been delayed for various reasons including being out of network with several providers that she was referred to  In any event she complains of anterior shoulder pain at this time with limited range of motion.  Her exam shows tenderness over the anterior aspect of the right shoulder just inferior to the acromion in the bicipital groove she has pain with extension of the arm and her active forward elevation is approximately 100 degrees with abduction about 85 degrees.  There is no real weakness in the rotator cuff.   MRI report IMPRESSION: Mild distal infraspinatus tendinosis with bursal sided fraying. No high-grade or retracted cuff tear. No muscle atrophy.   Mild glenohumeral osteoarthritis.   Nondisplaced posterosuperior labral tear.   Mild subacromial-subdeltoid bursitis.     Electronically Signed   By: Jacob  Kahn M.D.   On: 11/12/2022 16:49  She requests injection.  We discussed the various types of injections and decided to go with a bicipital groove injection at this time  Procedure: Biceps tendon injection Right biceps tendon was injected The patient gave verbal consent for cortisone injection Timeout confirmed the site of injection Medications used included 40 mg of Depo-Medrol  and 3 mL 1% lidocaine  After alcohol and ethyl chloride preparation the point of maximal tenderness was injected over the right biceps tendon there were no complications

## 2024-02-10 NOTE — Patient Instructions (Signed)
 You have received an injection of steroids into the joint. 15% of patients will have increased pain within the 24 hours postinjection.   This is transient and will go away.   We recommend that you use ice packs on the injection site for 20 minutes every 2 hours and extra strength Tylenol 2 tablets every 8 as needed until the pain resolves.  If you continue to have pain after taking the Tylenol and using the ice please call the office for further instructions.

## 2024-02-10 NOTE — Progress Notes (Signed)
 SABRA

## 2024-02-17 ENCOUNTER — Ambulatory Visit: Admitting: Orthopedic Surgery

## 2024-04-25 ENCOUNTER — Encounter (INDEPENDENT_AMBULATORY_CARE_PROVIDER_SITE_OTHER): Payer: Self-pay | Admitting: Gastroenterology

## 2024-07-27 ENCOUNTER — Ambulatory Visit
Admission: EM | Admit: 2024-07-27 | Discharge: 2024-07-27 | Disposition: A | Discharge status: Home / Self Care | Attending: Nurse Practitioner | Admitting: Nurse Practitioner

## 2024-07-27 ENCOUNTER — Ambulatory Visit

## 2024-07-27 DIAGNOSIS — Z8739 Personal history of other diseases of the musculoskeletal system and connective tissue: Secondary | ICD-10-CM | POA: Diagnosis not present

## 2024-07-27 DIAGNOSIS — M25531 Pain in right wrist: Secondary | ICD-10-CM | POA: Diagnosis not present

## 2024-07-27 MED ORDER — KETOROLAC TROMETHAMINE 30 MG/ML IJ SOLN
30.0000 mg | Freq: Once | INTRAMUSCULAR | Status: AC
  Administered 2024-07-27: 30 mg via INTRAMUSCULAR

## 2024-07-27 MED ORDER — DEXAMETHASONE SOD PHOSPHATE PF 10 MG/ML IJ SOLN
10.0000 mg | Freq: Once | INTRAMUSCULAR | Status: AC
  Administered 2024-07-27: 10 mg via INTRAMUSCULAR

## 2024-07-27 NOTE — ED Notes (Addendum)
 While asking the suicide assessment questions, pt stated not today I don't, but yesterday I did.  Pt stated I see a therapist already.

## 2024-07-27 NOTE — ED Provider Notes (Signed)
 " [facility]    CSN: [id number] Arrival date & time: 07/27/24  1530      History   Chief Complaint No chief complaint on file.   HPI Destiny Beck is a 54 y.o. female.   The history is provided by the patient.   Patient presents with a 1 week history of pain in the right wrist.  Patient denies any obvious injury or trauma.  She complains of swelling, pain, and decreased range of motion.  Patient reports history of tendinitis in the right wrist.  States that she normally uses Voltaren gel, but that has not helped her symptoms.  States that she has been seen by orthopedics for the same in the past.  Patient reports that she is ambidextrous, but uses her right hand frequently.  Past Medical History:  Diagnosis Date   Abscess    Anemia    Anxiety    Arthritis    Chronic back pain    Chronic fatigue    Chronic neck pain    Depression    Diabetes mellitus without complication (HCC)    Dizziness    Fibromyalgia    Gastroparesis 2024   GERD (gastroesophageal reflux disease)    Headache    Hypertension    Occipital neuralgia    Sciatica     Patient Active Problem List   Diagnosis Date Noted   Borderline personality disorder (HCC) 08/05/2023   Cigarette nicotine dependence in remission 08/05/2023   Gastroparesis 03/15/2023   Sacroiliac pain 02/16/2023   Bipolar 2 disorder, major depressive episode (HCC) 09/09/2021   Cannabis dependence, continuous (HCC) 07/15/2021   Insomnia due to other mental disorder 07/15/2021   Gastroesophageal reflux disease 07/14/2021   Chronic nausea 07/14/2021   Carpal tunnel syndrome of left wrist 12/14/2019   Lateral epicondylitis of left elbow 12/14/2019   Pain in joint of left elbow 12/12/2019   Dyslipidemia associated with type 2 diabetes mellitus (HCC) 04/22/2019   Radiculopathy of lumbar region 01/26/2019   Type 2 diabetes mellitus without complication, without long-term current use of insulin (HCC) 12/11/2018   Fatty  liver 12/08/2018   Constipation 11/06/2018   Bloating 11/06/2018   Abdominal distention 11/06/2018   Migraine without aura and without status migrainosus, not intractable 09/19/2018   Arthropathy of cervical facet joint 08/27/2018   Chronic pain disorder 08/27/2018   Cervical spondylosis without myelopathy 08/27/2018   Lumbar facet joint pain 08/27/2018   Fibroids 08/15/2018   Heme positive stool 07/26/2018   Absolute anemia 07/26/2018   Lumbar degenerative disc disease 06/29/2018   Acute right-sided back pain with sciatica 06/01/2018   Chronic use of benzodiazepine for therapeutic purpose 06/01/2018   Current moderate episode of major depressive disorder (HCC) 09/05/2017   Panic disorder with agoraphobia 06/15/2017   Chronic fatigue 11/09/2016   Need for tetanus, diphtheria, and acellular pertussis (Tdap) vaccine in patient of adolescent age or older 11/04/2015   DDD (degenerative disc disease), cervical 01/31/2014   Generalized anxiety disorder with panic attacks 01/31/2014    Past Surgical History:  Procedure Laterality Date   BIOPSY  08/04/2018   Procedure: BIOPSY;  Surgeon: Golda Claudis PENNER, MD;  Location: [facility];  Service: Endoscopy;;  gastric    BIOPSY  07/24/2021   Procedure: BIOPSY;  Surgeon: Eartha Angelia Sieving, MD;  Location: [facility];  Service: Gastroenterology;;   CESAREAN SECTION     x2   CHOLECYSTECTOMY N/A 03/20/2021   Procedure: LAPAROSCOPIC CHOLECYSTECTOMY;  Surgeon: Kallie Shaver  C, MD;  Location: [facility];  Service: General;  Laterality: N/A;   COLONOSCOPY WITH PROPOFOL  N/A 08/04/2018   normal, external hemorrhoids   DILATION AND CURETTAGE OF UTERUS     with ablation   ESOPHAGOGASTRODUODENOSCOPY (EGD) WITH PROPOFOL  N/A 08/04/2018   Normal proximal esophagus, mid esophagus and distal esophagus. 2cm hh, scar in prepyloric region of stomach, gastritis, normal duodenal bulb and second portion of duodenum   ESOPHAGOGASTRODUODENOSCOPY (EGD) WITH  PROPOFOL  N/A 07/24/2021   Procedure: ESOPHAGOGASTRODUODENOSCOPY (EGD) WITH PROPOFOL ;  Surgeon: Eartha Angelia Sieving, MD;  Location: [facility];  Service: Gastroenterology;  Laterality: N/A;  730   TUBAL LIGATION      OB History     Gravida  3   Para      Term      Preterm      AB      Living  3      SAB      IAB      Ectopic      Multiple      Live Births               Home Medications    Prior to Admission medications  Medication Sig Start Date End Date Taking? Authorizing Provider  CAPLYTA 21 MG CAPS Take 1 capsule by mouth at bedtime.    [provider]  dexlansoprazole  (DEXILANT ) 60 MG capsule Take 1 capsule (60 mg total) by mouth daily. 10/14/22   Eartha Angelia Sieving, MD  divalproex (DEPAKOTE) 500 MG DR tablet Take 500 mg by mouth 2 (two) times daily.    [provider]  Fezolinetant (VEOZAH) 45 MG TABS Take 45 mg by mouth daily.    [provider]  hydrOXYzine  (ATARAX ) 50 MG tablet Take 50 mg by mouth 3 (three) times daily. Patient not taking: Reported on 07/21/2023 06/02/21   [provider]  metFORMIN (GLUCOPHAGE) 1000 MG tablet Take 500 mg by mouth 2 (two) times daily. 06/17/21   [provider]  methocarbamol  (ROBAXIN ) 500 MG tablet TAKE ONE TABLET BY MOUTH THREE TIMES DAILY 10/10/23   Margrette Taft BRAVO, MD  metoCLOPramide  (REGLAN ) 5 MG tablet Take 1 tablet (5 mg total) by mouth 3 (three) times daily before meals. Patient not taking: Reported on 07/21/2023 11/02/22   Castaneda Mayorga, Daniel, MD  olopatadine (PATANOL) 0.1 % ophthalmic solution Place 1 drop into both eyes 2 (two) times daily as needed for allergies. Patient not taking: Reported on 07/21/2023    [provider]  Omega-3 Fatty Acids (FISH OIL) 1200 MG CAPS Take 1,200 mg by mouth in the morning and at bedtime. Patient not taking: Reported on 07/21/2023    [provider]  ondansetron  (ZOFRAN -ODT) 8 MG disintegrating tablet  SMARTSIG:1 Tablet(s) By Mouth Every 12 Hours PRN 03/09/23   [provider]  oxybutynin (DITROPAN) 5 MG tablet Take 5 mg by mouth 3 (three) times daily. 11/14/23   [provider]  prazosin (MINIPRESS) 2 MG capsule Take 2 mg by mouth at bedtime. 10/30/19   [provider]  predniSONE  (STERAPRED UNI-PAK 48 TAB) 10 MG (48) TBPK tablet Take by mouth daily. Patient not taking: Reported on 07/21/2023 10/14/22   Margrette Taft BRAVO, MD  sertraline (ZOLOFT) 100 MG tablet Take 200 mg by mouth daily.    [provider]  venlafaxine XR (EFFEXOR-XR) 150 MG 24 hr capsule Take 300 mg by mouth in the morning. Patient not taking: Reported on 07/21/2023 07/15/21   [provider]    Family History History reviewed. No pertinent family history.  Social History Social History[1]   Allergies   Cymbalta [duloxetine hcl]   Review of Systems Review of Systems Per HPI  Physical Exam Triage Vital Signs ED Triage Vitals  Encounter Vitals Group     BP 07/27/24 1553 131/83     Girls Systolic BP Percentile --      Girls Diastolic BP Percentile --      Boys Systolic BP Percentile --      Boys Diastolic BP Percentile --      Pulse Rate 07/27/24 1553 81     Resp 07/27/24 1553 18     Temp 07/27/24 1553 99 F (37.2 C)     Temp Source 07/27/24 1553 Oral     SpO2 07/27/24 1553 97 %     Weight --      Height --      Head Circumference --      Peak Flow --      Pain Score 07/27/24 1549 4     Pain Loc --      Pain Education --      Exclude from Growth Chart --    No data found.  Updated Vital Signs BP 131/83 (BP Location: Right Arm)   Pulse 81   Temp 99 F (37.2 C) (Oral)   Resp 18   SpO2 97%   Visual Acuity Right Eye Distance:   Left Eye Distance:   Bilateral Distance:    Right Eye Near:   Left Eye Near:    Bilateral Near:     Physical Exam Vitals and nursing note reviewed.  Constitutional:      General: She is not in acute distress.    Appearance:  Normal appearance.  HENT:     Head: Normocephalic.  Pulmonary:     Effort: Pulmonary effort is normal.  Musculoskeletal:     Right wrist: Swelling (ulnar aspect of right wrist) and tenderness (Tenderness noted to the ulnar aspect of the right wrist) present. No snuff box tenderness. Decreased range of motion. Normal pulse.     Right hand: No swelling. Decreased strength.  Skin:    General: Skin is warm and dry.  Neurological:     General: No focal deficit present.     Mental Status: She is alert and oriented to person, place, and time.  Psychiatric:        Mood and Affect: Mood normal.        Behavior: Behavior normal.      UC Treatments / Results  Labs (all labs ordered are listed, but only abnormal results are displayed) Labs Reviewed - No data to display  EKG   Radiology DG Wrist Complete Right Result Date: 07/27/2024 CLINICAL DATA:  Right-sided wrist pain EXAM: RIGHT WRIST - COMPLETE 3+ VIEW COMPARISON:  None Available. FINDINGS: Mild radiocarpal degenerative change. Punctate density adjacent to the tip of the ulnar styloid could reflect ossicle or age indeterminate punctate fracture. No malalignment IMPRESSION: Punctate density adjacent to the tip of the ulnar styloid could reflect ossicle or age indeterminate punctate fracture. Electronically Signed   By: Luke Bun M.D.   On: 07/27/2024 16:41    Procedures Procedures (including critical care time)  Medications Ordered in UC Medications  ketorolac  (TORADOL ) 30 MG/ML injection 30 mg (30 mg Intramuscular Given 07/27/24 1639)  dexamethasone  (DECADRON ) injection 10 mg (10 mg Intramuscular Given 07/27/24 1639)    Initial Impression / Assessment and  Plan / UC Course  I have reviewed the triage vital signs and the nursing notes.  Pertinent labs & imaging results that were available during my care of the patient were reviewed by me and considered in my medical decision making (see chart for details).  X-ray of the right  wrist shows a punctate density which could possibly indicate fracture.  Patient was administered Toradol  30 mg IM for pain and Decadron  10 mg for inflammation in the office.  A wrist brace was provided to allow for additional compression and support as fracture is age-indeterminate and symptoms have been present for 1 week.  Supportive care recommendations were provided and discussed with the patient to include RICE therapy, and over-the-counter analgesics.  Patient is scheduled to follow-up with orthopedics next week.  Patient was in agreement with this plan of care and verbalizes understanding.  All questions were answered.  Patient stable for discharge.   Final Clinical Impressions(s) / UC Diagnoses   Final diagnoses:  Wrist pain, acute, right  History of tendinitis     Discharge Instructions      The x-ray suggest that you do have a fracture of a bone in your wrist, but this does not appear to be a new fracture. You have been administered Decadron  and Toradol  while you are in the clinic today.  Do not take any additional NSAIDs to include ibuprofen , Aleve , Motrin , naproxen , or Advil .  You may take Tylenol  for breakthrough pain or discomfort.  You may also continue use of the Voltaren gel you were previously using. RICE therapy, rest, ice, compression, and elevation.  Apply ice for 20 minutes, remove for 1 hour, repeat as needed. Follow-up with orthopedics as scheduled for next week. Follow-up as needed.     ED Prescriptions   None    PDMP not reviewed this encounter.     [1]  Social History Tobacco Use   Smoking status: Former    Current packs/day: 0.00    Average packs/day: 0.5 packs/day for 30.0 years (15.0 ttl pk-yrs)    Types: Cigarettes    Start date: 01/08/1987    Quit date: 01/07/2017    Years since quitting: 7.5   Smokeless tobacco: Never  Vaping Use   Vaping status: Never Used  Substance Use Topics   Alcohol use: Yes    Comment: occ   Drug use: Yes    Types:  Marijuana    Comment: occ     Leath-WarrenEtta PARAS, NP 07/27/24 1707  "

## 2024-07-27 NOTE — ED Triage Notes (Signed)
 Pt reports she has right wrist pain  that pops out and back in x1 week

## 2024-07-27 NOTE — Discharge Instructions (Addendum)
 The x-ray suggest that you do have a fracture of a bone in your wrist, but this does not appear to be a new fracture. You have been administered Decadron  and Toradol  while you are in the clinic today.  Do not take any additional NSAIDs to include ibuprofen , Aleve , Motrin , naproxen , or Advil .  You may take Tylenol  for breakthrough pain or discomfort.  You may also continue use of the Voltaren gel you were previously using. RICE therapy, rest, ice, compression, and elevation.  Apply ice for 20 minutes, remove for 1 hour, repeat as needed. Follow-up with orthopedics as scheduled for next week. Follow-up as needed.

## 2024-08-01 ENCOUNTER — Ambulatory Visit: Admitting: Orthopedic Surgery

## 2024-08-02 ENCOUNTER — Ambulatory Visit: Admitting: Orthopedic Surgery

## 2024-08-02 ENCOUNTER — Encounter: Payer: Self-pay | Admitting: Orthopedic Surgery

## 2024-08-02 VITALS — BP 131/83 | Ht 60.0 in | Wt 135.0 lb

## 2024-08-02 DIAGNOSIS — M778 Other enthesopathies, not elsewhere classified: Secondary | ICD-10-CM

## 2024-08-02 MED ORDER — PREDNISONE 10 MG PO TABS: 10.0000 mg | ORAL_TABLET | Freq: Three times a day (TID) | ORAL | 0 refills | Status: AC

## 2024-08-02 NOTE — Progress Notes (Signed)
" ° °  Patient: Destiny Beck           Date of Birth: 02/08/1971           MRN: [id number] Visit Date: 08/02/2024 Requested by: Baird Comer GAILS, NP [address] [place],  [place] [address] PCP: Baird Comer GAILS, NP  Encounter Diagnosis  Name Primary?   Tendonitis of wrist, left Yes    Assessment and plan:  Wrist brace  Ice  Medication  Recheck in 2 weeks  Meds ordered this encounter  Medications   predniSONE  (DELTASONE ) 10 MG tablet    Sig: Take 1 tablet (10 mg total) by mouth 3 (three) times daily.    Dispense:  42 tablet    Refill:  0     Chief Complaint  Patient presents with   Wrist Pain    Right     History:  54 year old female presents with 1 week history of painful right wrist.  She says she has had tendinitis in the past but it was a long time ago.  She has been self managing her ulnar-sided wrist pain with a brace and ice with no relief  Focused exam findings:  Skin is warm dry intact no erythema  Tenderness noted over the ulnar side of the wrist primarily but there is some global tenderness as well.  She has decreased range of motion and tenderness when she flexes and extends the wrist and especially with supination with focal pain over the ulnar side.  No results found.   Outside imaging was done and she does have positive ulnar variance and what looks like it was maybe a fracture in the past but no historical evidence of such.  I am going to start her on some medication I will have her continue with bracing and ice and reevaluate in 14 days however I did advise if no improvement she would have to see a hand specialist for definitive diagnosis and management "

## 2024-08-02 NOTE — Progress Notes (Signed)
" °  Intake history:  Chief Complaint  Patient presents with   Wrist Pain    Right      BP 131/83 Comment: 07/27/24  Ht 5' (1.524 m)   Wt 135 lb (61.2 kg)   BMI 26.37 kg/m  Body mass index is 26.37 kg/m.  Pharmacy? ___[facility]___  WHAT ARE WE SEEING YOU FOR TODAY?   Right wrist   How long has this bothered you? (DOI?DOS?WS?)  One week ago, states she has history of tendonitis and old fracture/ can't turn hand and burning in wrist   Was there an injury? No  Anticoag.  No   Any ALLERGIES _______Allergies[1] _______________________________________   Treatment:  Have you taken:  Tylenol  No once  Advil  No once  Had PT No  Had injection No  Other  __________wrist splint / ice_______________        [1]  Allergies Allergen Reactions   Cymbalta [Duloxetine Hcl] Other (See Comments)    Stroke like symptoms    "

## 2024-08-02 NOTE — Patient Instructions (Signed)
 Ice   Brace   Start  Meds ordered this encounter  Medications   predniSONE  (DELTASONE ) 10 MG tablet    Sig: Take 1 tablet (10 mg total) by mouth 3 (three) times daily.    Dispense:  42 tablet    Refill:  0   Return 2 weeks

## 2024-09-13 ENCOUNTER — Ambulatory Visit: Admitting: Orthopedic Surgery
